# Patient Record
Sex: Female | Born: 1945 | Race: White | Hispanic: No | Smoking: Former smoker
Health system: Southern US, Community
[De-identification: ages and names within clinical notes are randomized; demographics above are authoritative.]

## PROBLEM LIST (undated history)

## (undated) ENCOUNTER — Encounter

## (undated) ENCOUNTER — Encounter
Attending: Student in an Organized Health Care Education/Training Program | Primary: Student in an Organized Health Care Education/Training Program

## (undated) ENCOUNTER — Encounter: Payer: MEDICARE | Attending: Specialist | Primary: Specialist

## (undated) ENCOUNTER — Encounter: Attending: Cardiovascular Disease | Primary: Cardiovascular Disease

## (undated) ENCOUNTER — Encounter: Attending: Vascular Surgery | Primary: Vascular Surgery

## (undated) ENCOUNTER — Ambulatory Visit

## (undated) ENCOUNTER — Telehealth
Attending: Student in an Organized Health Care Education/Training Program | Primary: Student in an Organized Health Care Education/Training Program

## (undated) ENCOUNTER — Telehealth

## (undated) ENCOUNTER — Encounter: Attending: Nurse Practitioner | Primary: Nurse Practitioner

## (undated) ENCOUNTER — Encounter
Payer: MEDICARE | Attending: Student in an Organized Health Care Education/Training Program | Primary: Student in an Organized Health Care Education/Training Program

## (undated) ENCOUNTER — Encounter: Attending: Family | Primary: Family

## (undated) ENCOUNTER — Ambulatory Visit: Payer: MEDICARE

## (undated) ENCOUNTER — Ambulatory Visit: Payer: MEDICARE | Attending: Specialist | Primary: Specialist

## (undated) ENCOUNTER — Encounter: Payer: MEDICARE | Attending: Vascular Surgery | Primary: Vascular Surgery

## (undated) ENCOUNTER — Telehealth: Attending: Vascular Surgery | Primary: Vascular Surgery

## (undated) ENCOUNTER — Encounter: Attending: Orthopaedic Surgery of the Spine | Primary: Orthopaedic Surgery of the Spine

## (undated) ENCOUNTER — Non-Acute Institutional Stay: Payer: MEDICARE | Attending: Family | Primary: Family

## (undated) ENCOUNTER — Encounter: Attending: Specialist | Primary: Specialist

## (undated) ENCOUNTER — Telehealth: Attending: Cardiovascular Disease | Primary: Cardiovascular Disease

## (undated) DIAGNOSIS — R29898 Other symptoms and signs involving the musculoskeletal system: Secondary | ICD-10-CM

## (undated) DIAGNOSIS — K219 Gastro-esophageal reflux disease without esophagitis: Secondary | ICD-10-CM

## (undated) DIAGNOSIS — D649 Anemia, unspecified: Secondary | ICD-10-CM

## (undated) DIAGNOSIS — R7303 Prediabetes: Secondary | ICD-10-CM

## (undated) DIAGNOSIS — C259 Malignant neoplasm of pancreas, unspecified: Secondary | ICD-10-CM

## (undated) DIAGNOSIS — I10 Essential (primary) hypertension: Secondary | ICD-10-CM

## (undated) DIAGNOSIS — I5022 Chronic systolic (congestive) heart failure: Secondary | ICD-10-CM

## (undated) DIAGNOSIS — Z8674 Personal history of sudden cardiac arrest: Secondary | ICD-10-CM

## (undated) DIAGNOSIS — Z950 Presence of cardiac pacemaker: Secondary | ICD-10-CM

## (undated) DIAGNOSIS — Z95810 Presence of automatic (implantable) cardiac defibrillator: Secondary | ICD-10-CM

## (undated) DIAGNOSIS — I509 Heart failure, unspecified: Secondary | ICD-10-CM

## (undated) DIAGNOSIS — I442 Atrioventricular block, complete: Secondary | ICD-10-CM

## (undated) DIAGNOSIS — E7800 Pure hypercholesterolemia, unspecified: Secondary | ICD-10-CM

## (undated) DIAGNOSIS — M5412 Radiculopathy, cervical region: Secondary | ICD-10-CM

## (undated) DIAGNOSIS — F419 Anxiety disorder, unspecified: Secondary | ICD-10-CM

## (undated) HISTORY — PX: HERNIA REPAIR: SHX51

## (undated) HISTORY — PX: NECK SURGERY: SHX720

## (undated) HISTORY — DX: Anemia, unspecified: D64.9

## (undated) HISTORY — PX: ABDOMINAL HYSTERECTOMY: SHX81

## (undated) HISTORY — PX: TONSILLECTOMY: SUR1361

## (undated) MED ORDER — METHYLPREDNISOLONE 4 MG TABLET: 0 days

---

## 2006-02-22 ENCOUNTER — Ambulatory Visit: Payer: Self-pay | Admitting: Family Medicine

## 2006-04-13 ENCOUNTER — Ambulatory Visit: Payer: Self-pay | Admitting: Gastroenterology

## 2007-06-04 ENCOUNTER — Ambulatory Visit: Payer: Self-pay | Admitting: Family Medicine

## 2008-07-29 ENCOUNTER — Ambulatory Visit: Payer: Self-pay | Admitting: Family Medicine

## 2008-10-05 ENCOUNTER — Ambulatory Visit: Payer: Self-pay | Admitting: Internal Medicine

## 2009-08-03 ENCOUNTER — Ambulatory Visit: Payer: Self-pay | Admitting: Family Medicine

## 2009-09-17 ENCOUNTER — Ambulatory Visit: Payer: Self-pay | Admitting: Internal Medicine

## 2009-10-07 ENCOUNTER — Ambulatory Visit: Payer: Self-pay | Admitting: Gastroenterology

## 2010-07-21 ENCOUNTER — Ambulatory Visit: Payer: Self-pay | Admitting: Podiatry

## 2011-01-10 ENCOUNTER — Ambulatory Visit: Payer: Self-pay | Admitting: Family Medicine

## 2011-10-30 ENCOUNTER — Ambulatory Visit: Payer: Self-pay | Admitting: Internal Medicine

## 2012-01-11 ENCOUNTER — Ambulatory Visit: Payer: Self-pay | Admitting: Family Medicine

## 2012-07-11 ENCOUNTER — Ambulatory Visit: Payer: Self-pay | Admitting: Unknown Physician Specialty

## 2013-01-29 ENCOUNTER — Ambulatory Visit: Payer: Self-pay | Admitting: Family Medicine

## 2013-12-25 DIAGNOSIS — M1990 Unspecified osteoarthritis, unspecified site: Secondary | ICD-10-CM | POA: Insufficient documentation

## 2013-12-25 DIAGNOSIS — M2010 Hallux valgus (acquired), unspecified foot: Secondary | ICD-10-CM | POA: Insufficient documentation

## 2013-12-25 DIAGNOSIS — I209 Angina pectoris, unspecified: Secondary | ICD-10-CM | POA: Insufficient documentation

## 2014-03-19 ENCOUNTER — Ambulatory Visit: Payer: Self-pay | Admitting: Family Medicine

## 2014-08-14 ENCOUNTER — Ambulatory Visit: Payer: Self-pay | Admitting: Surgery

## 2014-09-29 ENCOUNTER — Ambulatory Visit: Payer: Self-pay | Admitting: Surgery

## 2014-09-29 DIAGNOSIS — I10 Essential (primary) hypertension: Secondary | ICD-10-CM

## 2014-09-29 LAB — BASIC METABOLIC PANEL
ANION GAP: 5 — AB (ref 7–16)
BUN: 16 mg/dL (ref 7–18)
CALCIUM: 9.2 mg/dL (ref 8.5–10.1)
CHLORIDE: 103 mmol/L (ref 98–107)
Co2: 32 mmol/L (ref 21–32)
Creatinine: 1.1 mg/dL (ref 0.60–1.30)
EGFR (African American): >60
EGFR (Non-African Amer.): 52 — ABNORMAL LOW
Glucose: 84 mg/dL (ref 65–99)
Osmolality: 280 (ref 275–301)
POTASSIUM: 3.6 mmol/L (ref 3.5–5.1)
Sodium: 140 mmol/L (ref 136–145)

## 2014-09-29 LAB — CBC WITH DIFFERENTIAL/PLATELET
BASOS ABS: 0 10*3/uL (ref 0.0–0.1)
Basophil %: 0.7 %
Eosinophil #: 0.2 10*3/uL (ref 0.0–0.7)
Eosinophil %: 3 %
HCT: 38.4 % (ref 35.0–47.0)
HGB: 12.5 g/dL (ref 12.0–16.0)
LYMPHS ABS: 1.2 10*3/uL (ref 1.0–3.6)
Lymphocyte %: 22 %
MCH: 28.6 pg (ref 26.0–34.0)
MCHC: 32.5 g/dL (ref 32.0–36.0)
MCV: 88 fL (ref 80–100)
Monocyte #: 0.4 x10 3/mm (ref 0.2–0.9)
Monocyte %: 6.7 %
NEUTROS PCT: 67.6 %
Neutrophil #: 3.8 10*3/uL (ref 1.4–6.5)
Platelet: 224 10*3/uL (ref 150–440)
RBC: 4.38 10*6/uL (ref 3.80–5.20)
RDW: 14.1 % (ref 11.5–14.5)
WBC: 5.6 10*3/uL (ref 3.6–11.0)

## 2014-10-06 ENCOUNTER — Ambulatory Visit: Payer: Self-pay | Admitting: Surgery

## 2014-11-18 DIAGNOSIS — I10 Essential (primary) hypertension: Secondary | ICD-10-CM | POA: Diagnosis not present

## 2014-11-18 DIAGNOSIS — E669 Obesity, unspecified: Secondary | ICD-10-CM | POA: Diagnosis not present

## 2015-01-07 ENCOUNTER — Ambulatory Visit
Admit: 2015-01-07 | Disposition: A | Discharge status: Home / Self Care | Payer: Self-pay | Attending: Gastroenterology | Admitting: Gastroenterology

## 2015-01-07 DIAGNOSIS — Z800 Family history of malignant neoplasm of digestive organs: Secondary | ICD-10-CM | POA: Diagnosis not present

## 2015-01-07 DIAGNOSIS — Z79899 Other long term (current) drug therapy: Secondary | ICD-10-CM | POA: Diagnosis not present

## 2015-01-07 DIAGNOSIS — Z9889 Other specified postprocedural states: Secondary | ICD-10-CM | POA: Diagnosis not present

## 2015-01-07 DIAGNOSIS — I10 Essential (primary) hypertension: Secondary | ICD-10-CM | POA: Diagnosis not present

## 2015-01-07 DIAGNOSIS — Z86010 Personal history of colonic polyps: Secondary | ICD-10-CM | POA: Diagnosis not present

## 2015-01-07 DIAGNOSIS — Z7982 Long term (current) use of aspirin: Secondary | ICD-10-CM | POA: Diagnosis not present

## 2015-01-07 DIAGNOSIS — Z880 Allergy status to penicillin: Secondary | ICD-10-CM | POA: Diagnosis not present

## 2015-01-07 DIAGNOSIS — Z09 Encounter for follow-up examination after completed treatment for conditions other than malignant neoplasm: Secondary | ICD-10-CM | POA: Diagnosis not present

## 2015-01-07 DIAGNOSIS — Z91041 Radiographic dye allergy status: Secondary | ICD-10-CM | POA: Diagnosis not present

## 2015-01-07 LAB — HM COLONOSCOPY: HM COLON: NORMAL

## 2015-01-29 DIAGNOSIS — I272 Other secondary pulmonary hypertension: Secondary | ICD-10-CM | POA: Diagnosis not present

## 2015-01-29 DIAGNOSIS — I442 Atrioventricular block, complete: Secondary | ICD-10-CM | POA: Diagnosis not present

## 2015-01-29 DIAGNOSIS — Z95810 Presence of automatic (implantable) cardiac defibrillator: Secondary | ICD-10-CM | POA: Diagnosis not present

## 2015-01-29 DIAGNOSIS — I429 Cardiomyopathy, unspecified: Secondary | ICD-10-CM | POA: Diagnosis not present

## 2015-01-29 DIAGNOSIS — I2510 Atherosclerotic heart disease of native coronary artery without angina pectoris: Secondary | ICD-10-CM | POA: Diagnosis not present

## 2015-01-29 DIAGNOSIS — I447 Left bundle-branch block, unspecified: Secondary | ICD-10-CM | POA: Diagnosis not present

## 2015-01-29 DIAGNOSIS — R112 Nausea with vomiting, unspecified: Secondary | ICD-10-CM | POA: Diagnosis not present

## 2015-01-29 DIAGNOSIS — Z880 Allergy status to penicillin: Secondary | ICD-10-CM | POA: Diagnosis not present

## 2015-01-29 DIAGNOSIS — I10 Essential (primary) hypertension: Secondary | ICD-10-CM | POA: Diagnosis not present

## 2015-01-29 DIAGNOSIS — I361 Nonrheumatic tricuspid (valve) insufficiency: Secondary | ICD-10-CM | POA: Diagnosis not present

## 2015-01-29 DIAGNOSIS — Z87891 Personal history of nicotine dependence: Secondary | ICD-10-CM | POA: Diagnosis not present

## 2015-01-29 DIAGNOSIS — R55 Syncope and collapse: Secondary | ICD-10-CM | POA: Diagnosis not present

## 2015-01-29 DIAGNOSIS — I509 Heart failure, unspecified: Secondary | ICD-10-CM | POA: Diagnosis not present

## 2015-01-29 DIAGNOSIS — I5189 Other ill-defined heart diseases: Secondary | ICD-10-CM | POA: Diagnosis not present

## 2015-01-29 DIAGNOSIS — J939 Pneumothorax, unspecified: Secondary | ICD-10-CM | POA: Diagnosis not present

## 2015-01-29 DIAGNOSIS — I517 Cardiomegaly: Secondary | ICD-10-CM | POA: Diagnosis not present

## 2015-01-29 DIAGNOSIS — I5022 Chronic systolic (congestive) heart failure: Secondary | ICD-10-CM | POA: Diagnosis not present

## 2015-01-29 DIAGNOSIS — I340 Nonrheumatic mitral (valve) insufficiency: Secondary | ICD-10-CM | POA: Diagnosis not present

## 2015-01-29 DIAGNOSIS — E785 Hyperlipidemia, unspecified: Secondary | ICD-10-CM | POA: Diagnosis not present

## 2015-01-29 DIAGNOSIS — I4510 Unspecified right bundle-branch block: Secondary | ICD-10-CM | POA: Diagnosis not present

## 2015-01-29 DIAGNOSIS — Z950 Presence of cardiac pacemaker: Secondary | ICD-10-CM | POA: Diagnosis not present

## 2015-01-29 DIAGNOSIS — E119 Type 2 diabetes mellitus without complications: Secondary | ICD-10-CM | POA: Diagnosis not present

## 2015-01-29 DIAGNOSIS — R401 Stupor: Secondary | ICD-10-CM | POA: Diagnosis not present

## 2015-01-31 NOTE — Op Note (Signed)
PATIENT NAME:  Anna Marsh, Anna Marsh MR#:  [id number] DATE OF BIRTH:  [birth year]  DATE OF PROCEDURE:  10/06/2014  PREOPERATIVE DIAGNOSIS: Upper midline ventral hernia, symptomatic.   POSTOPERATIVE DIAGNOSIS: Upper midline ventral hernia, symptomatic.   PROCEDURE PERFORMED: Repair of ventral hernia with subfascial preperitoneal 6 cm circular Ventralex ST hernia patch.   SURGEON: Sherri Rad, M.D.   ASSISTANT: None.   ANESTHESIA: General with local.   FINDINGS: There was a 2 cm fascial defect with incarcerated preperitoneal fat.   SPECIMENS: None.   DESCRIPTION OF PROCEDURE: With informed consent obtained from the patient, she was brought to the operating room, positioned supine and general anesthesia was induced. The patient's abdomen was widely prepped and draped with ChloraPrep, followed by Charlie Pitter, and a timeout was observed.   A short upper midline skin incision was fashioned above the umbilicus with scalpel and carried down to the hernia sac contents with electrocautery and sharp dissection. The preperitoneal fat was identified. It was reduced back into the preperitoneal space. The fascial edges were defined. Finger sweep demonstrated a pocket which could be created within the preperitoneal space accommodating a 6 cm circular Ventralex ST hernia patch, which was inserted and unfurled into the fascia. It was secured at 4 points with mattress-type sutures of #0 Vicryl suture. Wound was irrigated. The fascia was then reapproximated loosely with figure-of-eight #0 Vicryl sutures in transverse orientation. The deep space was obliterated with a 2-0 Vicryl suture. Then 4-0 Vicryl subcuticular was applied to the skin edges followed by 3 interrupted vertical mattress 4-0 nylon sutures for purposes of skin eversion. Benzoin, Steri-Strips, and a Tegaderm dressing was then applied, and the patient was subsequently extubated and taken to the recovery room in stable and satisfactory condition by anesthesia services.    ____________________________ Jeannette How Marina Gravel, MD mab:sb D: 10/06/2014 09:00:30 ET T: 10/06/2014 11:17:30 ET JOB#: [id number]  cc: Elta Guadeloupe A. Marina Gravel, MD, <Dictator> Hortencia Conradi MD ELECTRONICALLY SIGNED 10/14/2014 9:57

## 2015-02-01 DIAGNOSIS — I429 Cardiomyopathy, unspecified: Secondary | ICD-10-CM | POA: Insufficient documentation

## 2015-02-03 DIAGNOSIS — Z950 Presence of cardiac pacemaker: Secondary | ICD-10-CM

## 2015-02-03 HISTORY — DX: Presence of cardiac pacemaker: Z95.0

## 2015-02-03 HISTORY — PX: PACEMAKER IMPLANT: EP1218

## 2015-02-10 DIAGNOSIS — I442 Atrioventricular block, complete: Secondary | ICD-10-CM | POA: Diagnosis not present

## 2015-02-10 DIAGNOSIS — I429 Cardiomyopathy, unspecified: Secondary | ICD-10-CM | POA: Diagnosis not present

## 2015-02-10 DIAGNOSIS — Z95810 Presence of automatic (implantable) cardiac defibrillator: Secondary | ICD-10-CM | POA: Diagnosis not present

## 2015-02-10 DIAGNOSIS — I447 Left bundle-branch block, unspecified: Secondary | ICD-10-CM | POA: Diagnosis not present

## 2015-02-11 DIAGNOSIS — Z95810 Presence of automatic (implantable) cardiac defibrillator: Secondary | ICD-10-CM | POA: Diagnosis not present

## 2015-02-15 DIAGNOSIS — E782 Mixed hyperlipidemia: Secondary | ICD-10-CM | POA: Insufficient documentation

## 2015-02-18 DIAGNOSIS — H2513 Age-related nuclear cataract, bilateral: Secondary | ICD-10-CM | POA: Diagnosis not present

## 2015-02-24 DIAGNOSIS — R55 Syncope and collapse: Secondary | ICD-10-CM | POA: Diagnosis not present

## 2015-02-24 DIAGNOSIS — Z9889 Other specified postprocedural states: Secondary | ICD-10-CM | POA: Insufficient documentation

## 2015-02-24 DIAGNOSIS — I10 Essential (primary) hypertension: Secondary | ICD-10-CM | POA: Diagnosis not present

## 2015-02-24 DIAGNOSIS — I5022 Chronic systolic (congestive) heart failure: Secondary | ICD-10-CM | POA: Insufficient documentation

## 2015-02-28 DIAGNOSIS — I447 Left bundle-branch block, unspecified: Secondary | ICD-10-CM | POA: Insufficient documentation

## 2015-02-28 DIAGNOSIS — Z95810 Presence of automatic (implantable) cardiac defibrillator: Secondary | ICD-10-CM | POA: Insufficient documentation

## 2015-02-28 DIAGNOSIS — I442 Atrioventricular block, complete: Secondary | ICD-10-CM | POA: Insufficient documentation

## 2015-02-28 DIAGNOSIS — K219 Gastro-esophageal reflux disease without esophagitis: Secondary | ICD-10-CM | POA: Insufficient documentation

## 2015-02-28 DIAGNOSIS — I10 Essential (primary) hypertension: Secondary | ICD-10-CM | POA: Insufficient documentation

## 2015-02-28 DIAGNOSIS — E7800 Pure hypercholesterolemia, unspecified: Secondary | ICD-10-CM | POA: Insufficient documentation

## 2015-02-28 DIAGNOSIS — E669 Obesity, unspecified: Secondary | ICD-10-CM | POA: Insufficient documentation

## 2015-03-02 ENCOUNTER — Telehealth: Payer: Self-pay | Admitting: *Deleted

## 2015-03-02 NOTE — Telephone Encounter (Signed)
Called to talk to [facility] about setting up Cardiac Rehab appointment. She was not available to talk this afternoon. Try again per Inez Catalina.

## 2015-03-24 DIAGNOSIS — R0602 Shortness of breath: Secondary | ICD-10-CM | POA: Diagnosis not present

## 2015-03-24 DIAGNOSIS — Z95810 Presence of automatic (implantable) cardiac defibrillator: Secondary | ICD-10-CM | POA: Diagnosis not present

## 2015-03-24 DIAGNOSIS — I5022 Chronic systolic (congestive) heart failure: Secondary | ICD-10-CM | POA: Diagnosis not present

## 2015-03-24 DIAGNOSIS — I10 Essential (primary) hypertension: Secondary | ICD-10-CM | POA: Diagnosis not present

## 2015-04-02 ENCOUNTER — Other Ambulatory Visit: Payer: Self-pay

## 2015-04-02 MED ORDER — FUROSEMIDE 40 MG PO TABS: 40.0000 mg | ORAL_TABLET | Freq: Every day | ORAL | Status: DC

## 2015-04-02 MED ORDER — METOPROLOL SUCCINATE ER 25 MG PO TB24: 25.0000 mg | ORAL_TABLET | Freq: Every day | ORAL | Status: DC

## 2015-04-02 MED ORDER — LISINOPRIL 5 MG PO TABS: 5.0000 mg | ORAL_TABLET | Freq: Every day | ORAL | Status: DC

## 2015-04-06 ENCOUNTER — Other Ambulatory Visit: Payer: Self-pay | Admitting: Emergency Medicine

## 2015-04-06 DIAGNOSIS — I10 Essential (primary) hypertension: Secondary | ICD-10-CM

## 2015-04-06 MED ORDER — METOPROLOL SUCCINATE ER 25 MG PO TB24: 25.0000 mg | ORAL_TABLET | Freq: Every day | ORAL | Status: DC

## 2015-04-06 MED ORDER — FUROSEMIDE 40 MG PO TABS: 40.0000 mg | ORAL_TABLET | Freq: Every day | ORAL | Status: DC

## 2015-04-06 MED ORDER — LISINOPRIL 10 MG PO TABS: 10.0000 mg | ORAL_TABLET | Freq: Every day | ORAL | Status: DC

## 2015-05-06 DIAGNOSIS — R55 Syncope and collapse: Secondary | ICD-10-CM | POA: Diagnosis not present

## 2015-05-06 DIAGNOSIS — I429 Cardiomyopathy, unspecified: Secondary | ICD-10-CM | POA: Diagnosis not present

## 2015-05-06 DIAGNOSIS — Z95810 Presence of automatic (implantable) cardiac defibrillator: Secondary | ICD-10-CM | POA: Diagnosis not present

## 2015-05-14 DIAGNOSIS — I429 Cardiomyopathy, unspecified: Secondary | ICD-10-CM | POA: Diagnosis not present

## 2015-05-14 DIAGNOSIS — Z4502 Encounter for adjustment and management of automatic implantable cardiac defibrillator: Secondary | ICD-10-CM | POA: Diagnosis not present

## 2015-05-18 ENCOUNTER — Encounter: Payer: Self-pay | Admitting: Family Medicine

## 2015-05-18 ENCOUNTER — Ambulatory Visit (INDEPENDENT_AMBULATORY_CARE_PROVIDER_SITE_OTHER): Payer: Medicare Other | Admitting: Family Medicine

## 2015-05-18 VITALS — BP 104/66 | HR 84 | Temp 98.1°F | Resp 12 | Wt 255.0 lb

## 2015-05-18 DIAGNOSIS — E669 Obesity, unspecified: Secondary | ICD-10-CM

## 2015-05-18 DIAGNOSIS — I10 Essential (primary) hypertension: Secondary | ICD-10-CM | POA: Diagnosis not present

## 2015-05-18 DIAGNOSIS — K219 Gastro-esophageal reflux disease without esophagitis: Secondary | ICD-10-CM

## 2015-05-18 DIAGNOSIS — F419 Anxiety disorder, unspecified: Secondary | ICD-10-CM

## 2015-05-18 DIAGNOSIS — E7800 Pure hypercholesterolemia, unspecified: Secondary | ICD-10-CM

## 2015-05-18 DIAGNOSIS — Z95810 Presence of automatic (implantable) cardiac defibrillator: Secondary | ICD-10-CM | POA: Diagnosis not present

## 2015-05-18 MED ORDER — SERTRALINE HCL 50 MG PO TABS: 50.0000 mg | ORAL_TABLET | Freq: Every day | ORAL | Status: DC

## 2015-05-18 NOTE — Progress Notes (Signed)
Patient ID: Anna Marsh, female   DOB: 04/04/1946, 69 y.o.   MRN: [id number]    Subjective:  HPI   Hypertension, follow-up:  BP Readings from Last 3 Encounters:  05/18/15 104/66  02/10/15 112/62    She was last seen for hypertension 5 months ago.  BP at that visit was 112/62. Management changes since that visit include none. She reports good compliance with treatment. She is not having side effects.  She is not exercising. She is adherent to low salt diet.   Outside blood pressures are not been checked. She is experiencing none.     Weight trend: stable Wt Readings from Last 3 Encounters:  05/18/15 255 lb (115.667 kg)  02/10/15 262 lb (118.842 kg)      Lipid/Cholesterol, Follow-up:   Last seen for this5 months ago.  Management changes since that visit include none. . Last Lipid Panel: No results found for: CHOL, TRIG, HDL, CHOLHDL, VLDL, LDLCALC, LDLDIRECT  She reports good compliance with treatment. She is not having side effects.  Current symptoms include none and have been stable. Weight trend: stable Prior visit with dietician: no Current diet: low salt Current exercise: none  Wt Readings from Last 3 Encounters:  05/18/15 255 lb (115.667 kg)  02/10/15 262 lb (118.842 kg)     Anxiety: Patient complains of anxiety, insomnia due to worrying about her husband and his memory issues.  She has the following symptoms: insomnia. Onset of symptoms was approximately a few months ago, gradually worsening since that time. She denies current suicidal and homicidal ideation. Family history significant for OCD for sister. She has never been treated.      Prior to Admission medications   Medication Sig Start Date End Date Taking? Authorizing Provider  nitroGLYCERIN (NITROSTAT) 0.4 MG SL tablet Place under the tongue. 02/03/15 02/03/16 Yes Historical Provider, MD  aspirin 81 MG tablet Take by mouth.    Historical Provider, MD  atorvastatin (LIPITOR) 40 MG tablet  Take by mouth.    Historical Provider, MD  cyanocobalamin 1000 MCG tablet Take by mouth.    Historical Provider, MD  furosemide (LASIX) 40 MG tablet Take 1 tablet (40 mg total) by mouth daily. 04/06/15   Richard Maceo Pro., MD  hydrochlorothiazide (HYDRODIURIL) 25 MG tablet Take by mouth.    Historical Provider, MD  lisinopril (PRINIVIL,ZESTRIL) 10 MG tablet Take 1 tablet (10 mg total) by mouth daily. 04/06/15   Richard Maceo Pro., MD  loratadine (CLARITIN) 10 MG tablet Take by mouth.    Historical Provider, MD  metoprolol succinate (TOPROL-XL) 25 MG 24 hr tablet Take 1 tablet (25 mg total) by mouth daily. 04/06/15   Richard Maceo Pro., MD  MULTIPLE VITAMIN PO Take by mouth.    Historical Provider, MD  omeprazole (PRILOSEC) 20 MG capsule Take by mouth.    Historical Provider, MD    Patient Active Problem List   Diagnosis Date Noted  . Cardiomyopathy 02/28/2015  . Acquired complete AV block 02/28/2015  . GERD (gastroesophageal reflux disease) 02/28/2015  . Hypercholesterolemia 02/28/2015  . Hypertension 02/28/2015  . Cardiac defibrillator in place 02/28/2015  . Block, bundle branch, left 02/28/2015  . Adiposity 02/28/2015  . Acid reflux 02/28/2015  . Chronic systolic heart failure 33/29/5188  . H/O cardiac catheterization 02/24/2015  . 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 Diabetes mellitus, type 2 01/29/2015  . Angina pectoris 12/25/2013  . Arthritis 12/25/2013  . Hallux abducto valgus 12/25/2013  No past medical history on file.  Social History   Social History  . Marital Status: Married    Spouse Name: N/A  . Number of Children: N/A  . Years of Education: N/A   Occupational History  . Not on file.   Social History Main Topics  . Smoking status: Never Smoker   . Smokeless tobacco: Never Used  . Alcohol Use: No  . Drug Use: No  . Sexual Activity: No   Other Topics Concern  . Not on file   Social History Narrative    Allergies    Allergen Reactions  . Iodine     Contrast Dye  . Penicillins     Review of Systems  Constitutional: Positive for malaise/fatigue (fair). Negative for fever and chills.  Respiratory: Negative for cough, hemoptysis, shortness of breath and wheezing.   Cardiovascular: Negative for chest pain, palpitations and leg swelling.  Gastrointestinal: Negative for heartburn, nausea, vomiting and abdominal pain.  Musculoskeletal: Positive for back pain (sometimes). Negative for myalgias, joint pain and neck pain.  Neurological: Negative for dizziness, tingling, tremors, weakness and headaches.  Psychiatric/Behavioral: Negative for depression, suicidal ideas, hallucinations and memory loss. The patient is nervous/anxious and has insomnia.      There is no immunization history on file for this patient. Objective:  BP 104/66 mmHg  Pulse 84  Temp(Src) 98.1 F (36.7 C)  Resp 12  Wt 255 lb (115.667 kg)  Physical Exam  Constitutional: She is oriented to person, place, and time and well-developed, well-nourished, and in no distress.  HENT:  Head: Normocephalic.  Eyes: Conjunctivae are normal. Pupils are equal, round, and reactive to light.  Neck: Normal range of motion. Neck supple.  Cardiovascular: Normal rate, regular rhythm, normal heart sounds and intact distal pulses.  Exam reveals no friction rub.   No murmur heard. Pulmonary/Chest: Effort normal. No respiratory distress. She has no wheezes. She has no rales.  Musculoskeletal: Normal range of motion. She exhibits no edema or tenderness.  Neurological: She is alert and oriented to person, place, and time. Gait normal.  Psychiatric: Memory and judgment normal.    Lab Results  Component Value Date   WBC 5.6 09/29/2014   HGB 12.5 09/29/2014   HCT 38.4 09/29/2014   PLT 224 09/29/2014   GLUCOSE 84 09/29/2014    CMP     Component Value Date/Time   NA 140 09/29/2014 1128   K 3.6 09/29/2014 1128   CL 103 09/29/2014 1128   CO2 32  09/29/2014 1128   GLUCOSE 84 09/29/2014 1128   BUN 16 09/29/2014 1128   CREATININE 1.10 09/29/2014 1128   CALCIUM 9.2 09/29/2014 1128   GFRNONAA 52* 09/29/2014 1128   GFRAA >60 09/29/2014 1128    Assessment and Plan :  1. Essential hypertension Stable. - CBC with Differential/Platelet - COMPLETE METABOLIC PANEL WITH GFR  2. Gastroesophageal reflux disease, esophagitis presence not specified Stable. - TSH  3. Hypercholesterolemia Check levels. - COMPLETE METABOLIC PANEL WITH GFR - Lipid Panel With LDL/HDL Ratio  4. Cardiac defibrillator in place Following cardiologist.  5. Adiposity Stable. - TSH  6. Acute anxiety New. Will start medication and follow up in 1 to 2 months. - sertraline (ZOLOFT) 50 MG tablet; Take 1 tablet (50 mg total) by mouth daily.  Dispense: 30 tablet; Refill: 3 Her husband has dementia in this is becoming more of a stressful issue for this patient.    Patient was seen and examined by Dr. Eulas Post and  note was scribed by Theressa Millard, RMA.'    [facility] 05/18/2015 11:38 AM

## 2015-05-19 ENCOUNTER — Telehealth: Payer: Self-pay | Admitting: Family Medicine

## 2015-05-19 NOTE — Telephone Encounter (Signed)
Pt went to [facility] to pick up her new RX for anxiety and the pharmacy told her they have not recd anything.  Can we check on this and call her [phone].  She uses the one on [address].  Thanks Con Memos

## 2015-05-19 NOTE — Telephone Encounter (Signed)
Called patient to verify med, and Patient reports that this is a new prescription you wanted her to try.

## 2015-05-20 NOTE — Telephone Encounter (Signed)
This was done during OV. It was confirmed by the pharmacy that they received it. Patient advised.

## 2015-05-26 DIAGNOSIS — E782 Mixed hyperlipidemia: Secondary | ICD-10-CM | POA: Diagnosis not present

## 2015-05-26 DIAGNOSIS — R05 Cough: Secondary | ICD-10-CM | POA: Diagnosis not present

## 2015-05-26 DIAGNOSIS — I5022 Chronic systolic (congestive) heart failure: Secondary | ICD-10-CM | POA: Diagnosis not present

## 2015-05-26 DIAGNOSIS — I10 Essential (primary) hypertension: Secondary | ICD-10-CM | POA: Diagnosis not present

## 2015-06-22 ENCOUNTER — Other Ambulatory Visit: Payer: Self-pay | Admitting: Family Medicine

## 2015-06-22 DIAGNOSIS — F419 Anxiety disorder, unspecified: Secondary | ICD-10-CM

## 2015-06-22 MED ORDER — SERTRALINE HCL 50 MG PO TABS: 50.0000 mg | ORAL_TABLET | Freq: Every day | ORAL | Status: DC

## 2015-06-22 NOTE — Telephone Encounter (Signed)
Pt would like [place] to send refills for sertraline (ZOLOFT) 50 MG tablet to [facility] order for 90 days supply instead of 30 day supply. Pt understands she has refills on the last RX but she would really like to get it in 90 day supply. Thanks TNP

## 2015-06-22 NOTE — Telephone Encounter (Signed)
Done-aa 

## 2015-06-24 ENCOUNTER — Ambulatory Visit (INDEPENDENT_AMBULATORY_CARE_PROVIDER_SITE_OTHER): Payer: Medicare Other

## 2015-06-24 DIAGNOSIS — Z23 Encounter for immunization: Secondary | ICD-10-CM | POA: Diagnosis not present

## 2015-06-29 ENCOUNTER — Other Ambulatory Visit: Payer: Self-pay

## 2015-06-29 DIAGNOSIS — I5022 Chronic systolic (congestive) heart failure: Secondary | ICD-10-CM | POA: Diagnosis not present

## 2015-06-29 DIAGNOSIS — I10 Essential (primary) hypertension: Secondary | ICD-10-CM | POA: Diagnosis not present

## 2015-06-29 DIAGNOSIS — E782 Mixed hyperlipidemia: Secondary | ICD-10-CM | POA: Diagnosis not present

## 2015-06-29 DIAGNOSIS — I209 Angina pectoris, unspecified: Secondary | ICD-10-CM | POA: Diagnosis not present

## 2015-06-29 DIAGNOSIS — R55 Syncope and collapse: Secondary | ICD-10-CM | POA: Diagnosis not present

## 2015-06-29 MED ORDER — HYDROCHLOROTHIAZIDE 25 MG PO TABS: 25.0000 mg | ORAL_TABLET | Freq: Every day | ORAL | Status: DC

## 2015-06-29 MED ORDER — ATORVASTATIN CALCIUM 40 MG PO TABS: 40.0000 mg | ORAL_TABLET | Freq: Every day | ORAL | Status: DC

## 2015-06-29 MED ORDER — OMEPRAZOLE 20 MG PO CPDR: 20.0000 mg | DELAYED_RELEASE_CAPSULE | Freq: Every day | ORAL | Status: DC

## 2015-07-20 ENCOUNTER — Encounter: Payer: Self-pay | Admitting: Family Medicine

## 2015-07-20 ENCOUNTER — Ambulatory Visit (INDEPENDENT_AMBULATORY_CARE_PROVIDER_SITE_OTHER): Payer: Medicare Other | Admitting: Family Medicine

## 2015-07-20 VITALS — BP 134/60 | HR 70 | Temp 98.2°F | Resp 16 | Wt 253.0 lb

## 2015-07-20 DIAGNOSIS — E7800 Pure hypercholesterolemia, unspecified: Secondary | ICD-10-CM

## 2015-07-20 DIAGNOSIS — I10 Essential (primary) hypertension: Secondary | ICD-10-CM

## 2015-07-20 DIAGNOSIS — R7303 Prediabetes: Secondary | ICD-10-CM | POA: Diagnosis not present

## 2015-07-20 DIAGNOSIS — E876 Hypokalemia: Secondary | ICD-10-CM

## 2015-07-20 DIAGNOSIS — F419 Anxiety disorder, unspecified: Secondary | ICD-10-CM | POA: Diagnosis not present

## 2015-07-20 NOTE — Progress Notes (Signed)
Patient ID: Anna Marsh, female   DOB: 1946/04/09, 69 y.o.   MRN: [id number]    Subjective:  HPI Pt is here today for a 2 month follow up for anxiety. She was started on Sertraline about 2 months ago. She reports that she is doing well on it and does not seem to have any side effects. She has lost her sister since the LOV and she said she thinks the medication has helped her because she is coping ok with her death.   Prior to Admission medications   Medication Sig Start Date End Date Taking? Authorizing Provider  aspirin 81 MG tablet Take by mouth.   Yes Historical Provider, MD  atorvastatin (LIPITOR) 40 MG tablet Take 1 tablet (40 mg total) by mouth daily. 06/29/15  Yes Margery Szostak Maceo Pro., MD  cyanocobalamin 1000 MCG tablet Take by mouth.   Yes Historical Provider, MD  furosemide (LASIX) 40 MG tablet Take 1 tablet (40 mg total) by mouth daily. 04/06/15  Yes Katelinn Justice Maceo Pro., MD  hydrochlorothiazide (HYDRODIURIL) 25 MG tablet Take 1 tablet (25 mg total) by mouth daily. 06/29/15  Yes Sam Wunschel Maceo Pro., MD  loratadine (CLARITIN) 10 MG tablet Take by mouth.   Yes Historical Provider, MD  metoprolol succinate (TOPROL-XL) 25 MG 24 hr tablet Take 1 tablet (25 mg total) by mouth daily. 04/06/15  Yes Tangy Drozdowski Maceo Pro., MD  MULTIPLE VITAMIN PO Take by mouth.   Yes Historical Provider, MD  nitroGLYCERIN (NITROSTAT) 0.4 MG SL tablet Place under the tongue. 02/03/15 02/03/16 Yes Historical Provider, MD  omeprazole (PRILOSEC) 20 MG capsule Take 1 capsule (20 mg total) by mouth daily. 06/29/15  Yes Ilija Maxim Maceo Pro., MD  sertraline (ZOLOFT) 50 MG tablet Take 1 tablet (50 mg total) by mouth daily. 06/22/15  Yes Jenaveve Fenstermaker Maceo Pro., MD    Patient Active Problem List   Diagnosis Date Noted  . Cardiomyopathy ([place]) 02/28/2015  . 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[place]) 02/28/2015  . GERD (gastroesophageal reflux disease) 02/28/2015  . Hypercholesterolemia 02/28/2015  . Hypertension 02/28/2015  .  Cardiac defibrillator in place 02/28/2015  . Block, bundle branch, left 02/28/2015  . Adiposity 02/28/2015  . Acid reflux 02/28/2015  . Complete atrioventricular block ([place]) 02/28/2015  . Chronic systolic heart failure ([place]) 02/24/2015  . H/O cardiac catheterization 02/24/2015  . 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 Diabetes mellitus, type 2 ([place]) 01/29/2015  . Angina pectoris ([place]) 12/25/2013  . Arthritis 12/25/2013  . 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 No pertinent past medical history.  Social History   Social History  . Marital Status: Married    Spouse Name: N/A  . Number of Children: N/A  . Years of Education: N/A   Occupational History  . Not on file.   Social History Main Topics  . Smoking status: Never Smoker   . Smokeless tobacco: Never Used  . Alcohol Use: No  . Drug Use: No  . Sexual Activity: No   Other Topics Concern  . Not on file   Social History Narrative    Allergies  Allergen Reactions  . Iodine     Contrast Dye  . Penicillins     Review of Systems  Constitutional: Negative.   HENT: Negative.   Eyes: Negative.   Respiratory: Negative.   Cardiovascular: Negative.   Gastrointestinal: Negative.   Genitourinary: Negative.   Musculoskeletal: Negative.   Skin: Negative.   Neurological: Negative.  Endo/Heme/Allergies: Negative.   Psychiatric/Behavioral: Negative.     Immunization History  Administered Date(s) Administered  . Influenza, High Dose Seasonal PF 06/24/2015   Objective:  BP 134/60 mmHg  Pulse 70  Temp(Src) 98.2 F (36.8 C) (Oral)  Resp 16  Wt 253 lb (114.76 kg)  Physical Exam  Constitutional: She is oriented to person, place, and time and well-developed, well-nourished, and in no distress.  HENT:  Head: Normocephalic and atraumatic.  Right Ear: External ear normal.  Left Ear: External ear normal.  Nose: Nose normal.  Eyes: Conjunctivae are normal.  Neck: Neck  supple.  Cardiovascular: Normal rate, regular rhythm and normal heart sounds.   Pulmonary/Chest: Effort normal and breath sounds normal.  Abdominal: Soft.  Neurological: She is alert and oriented to person, place, and time.  Skin: Skin is warm and dry.  Psychiatric: Mood, memory, affect and judgment normal.    Lab Results  Component Value Date   WBC 5.6 09/29/2014   HGB 12.5 09/29/2014   HCT 38.4 09/29/2014   PLT 224 09/29/2014   GLUCOSE 84 09/29/2014    CMP     Component Value Date/Time   NA 140 09/29/2014 1128   K 3.6 09/29/2014 1128   CL 103 09/29/2014 1128   CO2 32 09/29/2014 1128   GLUCOSE 84 09/29/2014 1128   BUN 16 09/29/2014 1128   CREATININE 1.10 09/29/2014 1128   CALCIUM 9.2 09/29/2014 1128   GFRNONAA 52* 09/29/2014 1128   GFRAA >60 09/29/2014 1128    Assessment and Plan :  1. Acute anxiety   2. Essential hypertension  - CBC with Differential/Platelet - TSH - Comprehensive metabolic panel  3. Hypercholesterolemia  - Lipid Panel With LDL/HDL Ratio - Comprehensive metabolic panel  4. Pre-diabetes  - Comprehensive metabolic panel - Hemoglobin A1c  5. Low serum potassium level Follow and stop supplement when corrected on [place]. - Potassium 6.CHF Pt now on [place] I have done the exam and reviewed the above chart and it is accurate to the best of my knowledge.  Miguel Aschoff MD [facility] 07/20/2015 2:44 PM

## 2015-07-21 DIAGNOSIS — R7303 Prediabetes: Secondary | ICD-10-CM | POA: Diagnosis not present

## 2015-07-21 DIAGNOSIS — E7800 Pure hypercholesterolemia, unspecified: Secondary | ICD-10-CM | POA: Diagnosis not present

## 2015-07-21 DIAGNOSIS — I10 Essential (primary) hypertension: Secondary | ICD-10-CM | POA: Diagnosis not present

## 2015-07-22 LAB — LIPID PANEL WITH LDL/HDL RATIO
CHOLESTEROL TOTAL: 217 mg/dL — AB (ref 100–199)
HDL: 55 mg/dL (ref >39)
LDL CALC: 136 mg/dL — AB (ref 0–99)
LDL/HDL RATIO: 2.5 ratio (ref 0.0–3.2)
TRIGLYCERIDES: 128 mg/dL (ref 0–149)
VLDL CHOLESTEROL CAL: 26 mg/dL (ref 5–40)

## 2015-07-22 LAB — CBC WITH DIFFERENTIAL/PLATELET
BASOS ABS: 0 10*3/uL (ref 0.0–0.2)
Basos: 1 %
EOS (ABSOLUTE): 0.2 10*3/uL (ref 0.0–0.4)
EOS: 2 %
HEMATOCRIT: 36.2 % (ref 34.0–46.6)
HEMOGLOBIN: 12.2 g/dL (ref 11.1–15.9)
IMMATURE GRANS (ABS): 0 10*3/uL (ref 0.0–0.1)
Immature Granulocytes: 0 %
LYMPHS ABS: 1.7 10*3/uL (ref 0.7–3.1)
LYMPHS: 25 %
MCH: 28.2 pg (ref 26.6–33.0)
MCHC: 33.7 g/dL (ref 31.5–35.7)
MCV: 84 fL (ref 79–97)
MONOCYTES: 6 %
Monocytes Absolute: 0.4 10*3/uL (ref 0.1–0.9)
Neutrophils Absolute: 4.4 10*3/uL (ref 1.4–7.0)
Neutrophils: 66 %
Platelets: 292 10*3/uL (ref 150–379)
RBC: 4.32 x10E6/uL (ref 3.77–5.28)
RDW: 13.8 % (ref 12.3–15.4)
WBC: 6.6 10*3/uL (ref 3.4–10.8)

## 2015-07-22 LAB — COMPREHENSIVE METABOLIC PANEL
ALT: 16 IU/L (ref 0–32)
AST: 19 IU/L (ref 0–40)
Albumin/Globulin Ratio: 1.6 (ref 1.1–2.5)
Albumin: 4.1 g/dL (ref 3.6–4.8)
Alkaline Phosphatase: 104 IU/L (ref 39–117)
BUN/Creatinine Ratio: 15 (ref 11–26)
BUN: 14 mg/dL (ref 8–27)
Bilirubin Total: 0.4 mg/dL (ref 0.0–1.2)
CALCIUM: 9.6 mg/dL (ref 8.7–10.3)
CO2: 31 mmol/L — AB (ref 18–29)
CREATININE: 0.91 mg/dL (ref 0.57–1.00)
Chloride: 93 mmol/L — ABNORMAL LOW (ref 97–106)
GFR calc Af Amer: 74 mL/min/{1.73_m2} (ref >59)
GFR, EST NON AFRICAN AMERICAN: 65 mL/min/{1.73_m2} (ref >59)
GLOBULIN, TOTAL: 2.6 g/dL (ref 1.5–4.5)
GLUCOSE: 110 mg/dL — AB (ref 65–99)
Potassium: 3.1 mmol/L — ABNORMAL LOW (ref 3.5–5.2)
Sodium: 141 mmol/L (ref 136–144)
Total Protein: 6.7 g/dL (ref 6.0–8.5)

## 2015-07-22 LAB — HEMOGLOBIN A1C
ESTIMATED AVERAGE GLUCOSE: 123 mg/dL
Hgb A1c MFr Bld: 5.9 % — ABNORMAL HIGH (ref 4.8–5.6)

## 2015-07-22 LAB — TSH: TSH: 2.02 u[IU]/mL (ref 0.450–4.500)

## 2015-07-22 MED ORDER — ATORVASTATIN CALCIUM 80 MG PO TABS: ORAL_TABLET | ORAL | Status: DC

## 2015-07-22 MED ORDER — POTASSIUM CHLORIDE ER 10 MEQ PO TBCR: 10.0000 meq | EXTENDED_RELEASE_TABLET | Freq: Two times a day (BID) | ORAL | Status: DC

## 2015-08-13 DIAGNOSIS — I429 Cardiomyopathy, unspecified: Secondary | ICD-10-CM | POA: Diagnosis not present

## 2015-08-13 DIAGNOSIS — Z4502 Encounter for adjustment and management of automatic implantable cardiac defibrillator: Secondary | ICD-10-CM | POA: Diagnosis not present

## 2015-08-18 DIAGNOSIS — E876 Hypokalemia: Secondary | ICD-10-CM | POA: Diagnosis not present

## 2015-08-19 LAB — POTASSIUM: POTASSIUM: 3.4 mmol/L — AB (ref 3.5–5.2)

## 2015-09-09 ENCOUNTER — Other Ambulatory Visit: Payer: Self-pay | Admitting: Family Medicine

## 2015-10-26 ENCOUNTER — Ambulatory Visit (INDEPENDENT_AMBULATORY_CARE_PROVIDER_SITE_OTHER): Payer: Medicare Other | Admitting: Family Medicine

## 2015-10-26 VITALS — BP 110/64 | HR 80 | Temp 98.4°F | Resp 16 | Wt 250.0 lb

## 2015-10-26 DIAGNOSIS — J069 Acute upper respiratory infection, unspecified: Secondary | ICD-10-CM | POA: Diagnosis not present

## 2015-10-26 NOTE — Progress Notes (Signed)
Patient ID: Anna Marsh, female   DOB: 1946-03-22, 70 y.o.   MRN: [id number]    Anna Marsh  MRN: [id number] DOB: 1945/10/29  Subjective:  HPI  1. Upper respiratory infection The patient is a 70 year old female who presents for evaluation of her cold symptoms.  She states that she started having a scratchy throat yesterday with watery eyes and a little bit of a cough.  When she coughs she states that what she gets up is clear.  She has had no fever and denies any other symptoms.  Patient Active Problem List   Diagnosis Date Noted  . Pre-diabetes 07/20/2015  . Acute anxiety 07/20/2015  . Cardiomyopathy ([place]) 02/28/2015  . 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[place]) 02/28/2015  . GERD (gastroesophageal reflux disease) 02/28/2015  . Hypercholesterolemia 02/28/2015  . Hypertension 02/28/2015  . Cardiac defibrillator in place 02/28/2015  . Block, bundle branch, left 02/28/2015  . Adiposity 02/28/2015  . Acid reflux 02/28/2015  . Complete atrioventricular block ([place]) 02/28/2015  . Chronic systolic heart failure ([place]) 02/24/2015  . H/O cardiac catheterization 02/24/2015  . 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 Diabetes mellitus, type 2 ([place]) 01/29/2015  . Angina pectoris ([place]) 12/25/2013  . Arthritis 12/25/2013  . 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  Social History   Social History  . Marital Status: Married    Spouse Name: N/A  . Number of Children: N/A  . Years of Education: N/A   Occupational History  . Not on file.   Social History Main Topics  . Smoking status: Never Smoker   . Smokeless tobacco: Never Used  . Alcohol Use: No  . Drug Use: No  . Sexual Activity: No   Other Topics Concern  . Not on file   Social History Narrative    Outpatient Prescriptions Prior to Visit  Medication Sig Dispense Refill  . aspirin 81 MG tablet Take by mouth.    Marland Kitchen atorvastatin (LIPITOR) 80 MG tablet Take 1 tablet  daily 90 tablet 3  . cyanocobalamin 1000 MCG tablet Take by mouth.    . furosemide (LASIX) 40 MG tablet Take 1 tablet (40 mg total) by mouth daily. 90 tablet 1  . hydrochlorothiazide (HYDRODIURIL) 25 MG tablet Take 1 tablet (25 mg total) by mouth daily. 90 tablet 3  . loratadine (CLARITIN) 10 MG tablet Take by mouth.    . metoprolol succinate (TOPROL-XL) 25 MG 24 hr tablet Take 1 tablet (25 mg total) by mouth daily. 90 tablet 1  . MULTIPLE VITAMIN PO Take by mouth.    . nitroGLYCERIN (NITROSTAT) 0.4 MG SL tablet Place under the tongue.    Marland Kitchen omeprazole (PRILOSEC) 20 MG capsule Take 1 capsule (20 mg total) by mouth daily. 90 capsule 3  . potassium chloride (K-DUR) 10 MEQ tablet Take 1 tablet by mouth two  times daily 180 tablet 3  . sertraline (ZOLOFT) 50 MG tablet Take 1 tablet (50 mg total) by mouth daily. 90 tablet 3   No facility-administered medications prior to visit.    Allergies  Allergen Reactions  . Iodinated Diagnostic Agents Other (See Comments)  . Iodine     Contrast Dye  . Penicillins     Review of Systems  Constitutional: Negative for fever, chills, weight loss, malaise/fatigue and diaphoresis.  HENT: Positive for congestion and sore throat. Negative for ear discharge, ear pain, hearing loss, nosebleeds and tinnitus.   Eyes: Positive for discharge.  Negative for blurred vision, double vision, photophobia, pain and redness.  Respiratory: Positive for cough and sputum production. Negative for hemoptysis, shortness of breath and wheezing.   Cardiovascular: Negative for chest pain, palpitations, orthopnea and leg swelling.  Gastrointestinal: Negative.   Neurological: Positive for headaches. Negative for weakness.  Endo/Heme/Allergies: Negative.   Psychiatric/Behavioral: Negative.    Objective:  BP 110/64 mmHg  Pulse 80  Temp(Src) 98.4 F (36.9 C) (Oral)  Resp 16  Wt 250 lb (113.399 kg)  Physical Exam  Constitutional: She is oriented to person, place, and time and  well-developed, well-nourished, and in no distress.  HENT:  Head: Normocephalic and atraumatic.  Right Ear: External ear normal.  Left Ear: External ear normal.  Eyes: Conjunctivae and EOM are normal. Pupils are equal, round, and reactive to light.  Neck: Normal range of motion. Neck supple.  Cardiovascular: Normal rate, regular rhythm and normal heart sounds.   Pulmonary/Chest: Effort normal and breath sounds normal.  Abdominal: Soft.  Neurological: She is alert and oriented to person, place, and time. Gait normal.  Skin: Skin is warm and dry.  Psychiatric: Mood, memory, affect and judgment normal.    Assessment and Plan :   1. Upper respiratory infection Viral, no need for antibiotics.  Patient recommended to increase fluids, and use Vitamin C and Zinc.  May take a week or more to get over.  Robitussin for cough.  Patient to return to clinic if symptoms worsen or change. 2. Cardiomyopathy All issues treated [facility] 10/26/2015 11:00 AM

## 2015-11-12 DIAGNOSIS — I429 Cardiomyopathy, unspecified: Secondary | ICD-10-CM | POA: Diagnosis not present

## 2015-11-12 DIAGNOSIS — Z4502 Encounter for adjustment and management of automatic implantable cardiac defibrillator: Secondary | ICD-10-CM | POA: Diagnosis not present

## 2015-11-16 DIAGNOSIS — I361 Nonrheumatic tricuspid (valve) insufficiency: Secondary | ICD-10-CM | POA: Diagnosis not present

## 2015-11-16 DIAGNOSIS — E785 Hyperlipidemia, unspecified: Secondary | ICD-10-CM | POA: Diagnosis not present

## 2015-11-16 DIAGNOSIS — I499 Cardiac arrhythmia, unspecified: Secondary | ICD-10-CM | POA: Diagnosis not present

## 2015-11-16 DIAGNOSIS — E119 Type 2 diabetes mellitus without complications: Secondary | ICD-10-CM | POA: Diagnosis not present

## 2015-11-16 DIAGNOSIS — I110 Hypertensive heart disease with heart failure: Secondary | ICD-10-CM | POA: Diagnosis not present

## 2015-11-16 DIAGNOSIS — I429 Cardiomyopathy, unspecified: Secondary | ICD-10-CM | POA: Diagnosis not present

## 2015-11-16 DIAGNOSIS — Z95810 Presence of automatic (implantable) cardiac defibrillator: Secondary | ICD-10-CM | POA: Diagnosis not present

## 2015-11-16 DIAGNOSIS — Z79899 Other long term (current) drug therapy: Secondary | ICD-10-CM | POA: Diagnosis not present

## 2015-11-16 DIAGNOSIS — I447 Left bundle-branch block, unspecified: Secondary | ICD-10-CM | POA: Diagnosis not present

## 2015-11-22 DIAGNOSIS — H2513 Age-related nuclear cataract, bilateral: Secondary | ICD-10-CM | POA: Diagnosis not present

## 2015-12-14 DIAGNOSIS — I5032 Chronic diastolic (congestive) heart failure: Secondary | ICD-10-CM | POA: Diagnosis not present

## 2015-12-14 DIAGNOSIS — I10 Essential (primary) hypertension: Secondary | ICD-10-CM | POA: Diagnosis not present

## 2015-12-14 DIAGNOSIS — I5022 Chronic systolic (congestive) heart failure: Secondary | ICD-10-CM | POA: Diagnosis not present

## 2015-12-14 DIAGNOSIS — Z95810 Presence of automatic (implantable) cardiac defibrillator: Secondary | ICD-10-CM | POA: Diagnosis not present

## 2015-12-24 ENCOUNTER — Other Ambulatory Visit: Payer: Self-pay | Admitting: Family Medicine

## 2015-12-24 DIAGNOSIS — Z1231 Encounter for screening mammogram for malignant neoplasm of breast: Secondary | ICD-10-CM

## 2016-01-04 ENCOUNTER — Ambulatory Visit
Admission: RE | Admit: 2016-01-04 | Discharge: 2016-01-04 | Disposition: A | Discharge status: Home / Self Care | Payer: Medicare Other | Source: Ambulatory Visit | Attending: Family Medicine | Admitting: Family Medicine

## 2016-01-04 DIAGNOSIS — Z1231 Encounter for screening mammogram for malignant neoplasm of breast: Secondary | ICD-10-CM | POA: Insufficient documentation

## 2016-01-18 ENCOUNTER — Encounter: Payer: Self-pay | Admitting: Family Medicine

## 2016-01-18 ENCOUNTER — Ambulatory Visit (INDEPENDENT_AMBULATORY_CARE_PROVIDER_SITE_OTHER): Payer: Medicare Other | Admitting: Family Medicine

## 2016-01-18 VITALS — BP 118/60 | HR 78 | Temp 98.3°F | Resp 16 | Wt 249.0 lb

## 2016-01-18 DIAGNOSIS — I10 Essential (primary) hypertension: Secondary | ICD-10-CM | POA: Diagnosis not present

## 2016-01-18 DIAGNOSIS — R739 Hyperglycemia, unspecified: Secondary | ICD-10-CM | POA: Diagnosis not present

## 2016-01-18 DIAGNOSIS — E7800 Pure hypercholesterolemia, unspecified: Secondary | ICD-10-CM

## 2016-01-18 DIAGNOSIS — I5022 Chronic systolic (congestive) heart failure: Secondary | ICD-10-CM

## 2016-01-18 NOTE — Progress Notes (Signed)
Patient ID: Anna Marsh, female   DOB: 1946/05/12, 70 y.o.   MRN: [id number]    Subjective:  HPI  Lipid/Cholesterol, Follow-up:   Last seen for this6 months ago.  Management changes since that visit include increase Lipitor 80 mg daily. . Last Lipid Panel:    Component Value Date/Time   CHOL 217* 07/21/2015 1026   TRIG 128 07/21/2015 1026   HDL 55 07/21/2015 1026   LDLCALC 136* 07/21/2015 1026    Risk factors for vascular disease include diabetes mellitus, hypercholesterolemia and hypertension  She reports good compliance with treatment. She is not having side effects.  Current symptoms include none and have been unchanged. Weight trend: stable  Wt Readings from Last 3 Encounters:  01/18/16 249 lb (112.946 kg)  10/26/15 250 lb (113.399 kg)  07/20/15 253 lb (114.76 kg)   ------------------------------------------------------------------- Will need labs done since increase of cholesterol meds, will need A1c as well with the labs.   Pt reports that she is doing well emotionally.   Prior to Admission medications   Medication Sig Start Date End Date Taking? Authorizing Provider  aspirin 81 MG tablet Take by mouth.   Yes Historical Provider, MD  atorvastatin (LIPITOR) 80 MG tablet Take 1 tablet daily 07/22/15  Yes Lillia Lengel Maceo Pro., MD  cyanocobalamin 1000 MCG tablet Take by mouth.   Yes Historical Provider, MD  ENTRESTO 24-26 MG  09/13/15  Yes Historical Provider, MD  furosemide (LASIX) 40 MG tablet Take 1 tablet (40 mg total) by mouth daily. 04/06/15  Yes Brittanee Ghazarian Maceo Pro., MD  hydrochlorothiazide (HYDRODIURIL) 25 MG tablet Take 1 tablet (25 mg total) by mouth daily. 06/29/15  Yes Amaziah Ghosh Maceo Pro., MD  loratadine (CLARITIN) 10 MG tablet Take by mouth.   Yes Historical Provider, MD  metoprolol succinate (TOPROL-XL) 25 MG 24 hr tablet Take 1 tablet (25 mg total) by mouth daily. 04/06/15  Yes Mitchell Epling Maceo Pro., MD  MULTIPLE VITAMIN PO Take by mouth.   Yes  Historical Provider, MD  nitroGLYCERIN (NITROSTAT) 0.4 MG SL tablet Place under the tongue. 02/03/15 02/03/16 Yes Historical Provider, MD  omeprazole (PRILOSEC) 20 MG capsule Take 1 capsule (20 mg total) by mouth daily. 06/29/15  Yes Loveta Dellis Maceo Pro., MD  potassium chloride (K-DUR) 10 MEQ tablet Take 1 tablet by mouth two  times daily 09/09/15  Yes Amon Costilla Maceo Pro., MD  sertraline (ZOLOFT) 50 MG tablet Take 1 tablet (50 mg total) by mouth daily. 06/22/15  Yes Fanta Wimberley Maceo Pro., MD    Patient Active Problem List   Diagnosis Date Noted  . Pre-diabetes 07/20/2015  . Acute anxiety 07/20/2015  . Cardiomyopathy ([place]) 02/28/2015  . 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 GERD (gastroesophageal reflux disease) 02/28/2015  . Hypercholesterolemia 02/28/2015  . Hypertension 02/28/2015  . Cardiac defibrillator in place 02/28/2015  . Block, bundle branch, left 02/28/2015  . Adiposity 02/28/2015  . Acid reflux 02/28/2015  . 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 Chronic systolic heart failure ([place]) 02/24/2015  . H/O cardiac catheterization 02/24/2015  . 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 Diabetes mellitus, type 2 ([place]) 01/29/2015  . Angina pectoris ([place]) 12/25/2013  . Arthritis 12/25/2013  . 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 No pertinent past medical history.  Social History   Social History  . Marital Status: Married    Spouse Name: N/A  . Number of Children: N/A  . Years of Education:  N/A   Occupational History  . Not on file.   Social History Main Topics  . Smoking status: Never Smoker   . Smokeless tobacco: Never Used  . Alcohol Use: No  . Drug Use: No  . Sexual Activity: No   Other Topics Concern  . Not on file   Social History Narrative    Allergies  Allergen Reactions  . Iodinated Diagnostic Agents Other (See Comments)  . Iodine     Contrast Dye  . Penicillins     Review of Systems    Constitutional: Negative.   HENT: Negative.   Eyes: Negative.   Respiratory: Negative.   Cardiovascular: Negative.   Gastrointestinal: Negative.   Genitourinary: Negative.   Musculoskeletal: Negative.   Skin: Negative.   Neurological: Negative.   Endo/Heme/Allergies: Negative.   Psychiatric/Behavioral: Negative.     Immunization History  Administered Date(s) Administered  . Influenza, High Dose Seasonal PF 06/24/2015   Objective:  BP 118/60 mmHg  Pulse 78  Temp(Src) 98.3 F (36.8 C) (Oral)  Resp 16  Wt 249 lb (112.946 kg)  Physical Exam  Constitutional: She is oriented to person, place, and time and well-developed, well-nourished, and in no distress.  HENT:  Head: Normocephalic and atraumatic.  Right Ear: External ear normal.  Left Ear: External ear normal.  Nose: Nose normal.  Eyes: Conjunctivae and EOM are normal. Pupils are equal, round, and reactive to light.  Neck: Normal range of motion. Neck supple.  Cardiovascular: Normal rate, regular rhythm, normal heart sounds and intact distal pulses.   Pulmonary/Chest: Effort normal and breath sounds normal.  Abdominal: Soft.  Musculoskeletal: Normal range of motion.  Neurological: She is alert and oriented to person, place, and time. She has normal reflexes. Gait normal. GCS score is 15.  Skin: Skin is warm and dry.  Psychiatric: Mood, memory, affect and judgment normal.    Lab Results  Component Value Date   WBC 6.6 07/21/2015   HGB 12.5 09/29/2014   HCT 36.2 07/21/2015   PLT 292 07/21/2015   GLUCOSE 110* 07/21/2015   CHOL 217* 07/21/2015   TRIG 128 07/21/2015   HDL 55 07/21/2015   LDLCALC 136* 07/21/2015   TSH 2.020 07/21/2015   HGBA1C 5.9* 07/21/2015    CMP     Component Value Date/Time   NA 141 07/21/2015 1026   NA 140 09/29/2014 1128   K 3.4* 08/18/2015 0804   K 3.6 09/29/2014 1128   CL 93* 07/21/2015 1026   CL 103 09/29/2014 1128   CO2 31* 07/21/2015 1026   CO2 32 09/29/2014 1128   GLUCOSE  110* 07/21/2015 1026   GLUCOSE 84 09/29/2014 1128   BUN 14 07/21/2015 1026   BUN 16 09/29/2014 1128   CREATININE 0.91 07/21/2015 1026   CREATININE 1.10 09/29/2014 1128   CALCIUM 9.6 07/21/2015 1026   CALCIUM 9.2 09/29/2014 1128   PROT 6.7 07/21/2015 1026   ALBUMIN 4.1 07/21/2015 1026   AST 19 07/21/2015 1026   ALT 16 07/21/2015 1026   ALKPHOS 104 07/21/2015 1026   BILITOT 0.4 07/21/2015 1026   GFRNONAA 65 07/21/2015 1026   GFRNONAA 52* 09/29/2014 1128   GFRAA 74 07/21/2015 1026   GFRAA >60 09/29/2014 1128    Assessment and Plan :  1. Essential hypertension - CBC with Differential/Platelet - TSH - Comprehensive metabolic panel  2. Chronic systolic heart failure ([place])   3. Hypercholesterolemia  - Lipid Panel With LDL/HDL Ratio - Comprehensive metabolic panel  4. Hyperglycemia  -  Hemoglobin A1c - Comprehensive metabolic panel 5.complete heart block--followed at [facility]. 6. Ischemic cardiomyopathy with EF of 25% I have done the exam and reviewed the above chart and it is accurate to the best of my knowledge.   Miguel Aschoff MD [facility] 01/18/2016 11:38 AM

## 2016-02-15 DIAGNOSIS — I429 Cardiomyopathy, unspecified: Secondary | ICD-10-CM | POA: Diagnosis not present

## 2016-02-15 DIAGNOSIS — Z45018 Encounter for adjustment and management of other part of cardiac pacemaker: Secondary | ICD-10-CM | POA: Diagnosis not present

## 2016-03-14 ENCOUNTER — Telehealth: Payer: Self-pay | Admitting: Emergency Medicine

## 2016-03-14 NOTE — Telephone Encounter (Signed)
Pt returning call. I did not see where anyone called her but said someone did.

## 2016-03-25 ENCOUNTER — Other Ambulatory Visit: Payer: Self-pay | Admitting: Family Medicine

## 2016-03-27 ENCOUNTER — Other Ambulatory Visit: Payer: Self-pay

## 2016-03-27 DIAGNOSIS — I10 Essential (primary) hypertension: Secondary | ICD-10-CM

## 2016-03-27 MED ORDER — METOPROLOL SUCCINATE ER 25 MG PO TB24: 25.0000 mg | ORAL_TABLET | Freq: Every day | ORAL | Status: DC

## 2016-04-06 DIAGNOSIS — R739 Hyperglycemia, unspecified: Secondary | ICD-10-CM | POA: Diagnosis not present

## 2016-04-06 DIAGNOSIS — I10 Essential (primary) hypertension: Secondary | ICD-10-CM | POA: Diagnosis not present

## 2016-04-06 DIAGNOSIS — E7800 Pure hypercholesterolemia, unspecified: Secondary | ICD-10-CM | POA: Diagnosis not present

## 2016-04-07 ENCOUNTER — Telehealth: Payer: Self-pay

## 2016-04-07 LAB — COMPREHENSIVE METABOLIC PANEL
A/G RATIO: 1.5 (ref 1.2–2.2)
ALBUMIN: 3.8 g/dL (ref 3.5–4.8)
ALK PHOS: 121 IU/L — AB (ref 39–117)
ALT: 13 IU/L (ref 0–32)
AST: 19 IU/L (ref 0–40)
BUN / CREAT RATIO: 16 (ref 12–28)
BUN: 16 mg/dL (ref 8–27)
Bilirubin Total: 0.4 mg/dL (ref 0.0–1.2)
CO2: 29 mmol/L (ref 18–29)
Calcium: 9.3 mg/dL (ref 8.7–10.3)
Chloride: 100 mmol/L (ref 96–106)
Creatinine, Ser: 0.99 mg/dL (ref 0.57–1.00)
GFR calc Af Amer: 67 mL/min/{1.73_m2} (ref >59)
GFR, EST NON AFRICAN AMERICAN: 58 mL/min/{1.73_m2} — AB (ref >59)
GLOBULIN, TOTAL: 2.5 g/dL (ref 1.5–4.5)
Glucose: 105 mg/dL — ABNORMAL HIGH (ref 65–99)
POTASSIUM: 3.4 mmol/L — AB (ref 3.5–5.2)
SODIUM: 146 mmol/L — AB (ref 134–144)
Total Protein: 6.3 g/dL (ref 6.0–8.5)

## 2016-04-07 LAB — HEMOGLOBIN A1C
Est. average glucose Bld gHb Est-mCnc: 120 mg/dL
Hgb A1c MFr Bld: 5.8 % — ABNORMAL HIGH (ref 4.8–5.6)

## 2016-04-07 LAB — CBC WITH DIFFERENTIAL/PLATELET
BASOS: 0 %
Basophils Absolute: 0 10*3/uL (ref 0.0–0.2)
EOS (ABSOLUTE): 0.3 10*3/uL (ref 0.0–0.4)
EOS: 4 %
HEMATOCRIT: 36.8 % (ref 34.0–46.6)
HEMOGLOBIN: 12.2 g/dL (ref 11.1–15.9)
IMMATURE GRANULOCYTES: 0 %
Immature Grans (Abs): 0 10*3/uL (ref 0.0–0.1)
LYMPHS ABS: 1.7 10*3/uL (ref 0.7–3.1)
Lymphs: 25 %
MCH: 27.9 pg (ref 26.6–33.0)
MCHC: 33.2 g/dL (ref 31.5–35.7)
MCV: 84 fL (ref 79–97)
MONOCYTES: 7 %
Monocytes Absolute: 0.4 10*3/uL (ref 0.1–0.9)
Neutrophils Absolute: 4.1 10*3/uL (ref 1.4–7.0)
Neutrophils: 64 %
Platelets: 250 10*3/uL (ref 150–379)
RBC: 4.37 x10E6/uL (ref 3.77–5.28)
RDW: 14.3 % (ref 12.3–15.4)
WBC: 6.5 10*3/uL (ref 3.4–10.8)

## 2016-04-07 LAB — LIPID PANEL WITH LDL/HDL RATIO
CHOLESTEROL TOTAL: 183 mg/dL (ref 100–199)
HDL: 52 mg/dL (ref >39)
LDL CALC: 108 mg/dL — AB (ref 0–99)
LDL/HDL RATIO: 2.1 ratio (ref 0.0–3.2)
TRIGLYCERIDES: 116 mg/dL (ref 0–149)
VLDL Cholesterol Cal: 23 mg/dL (ref 5–40)

## 2016-04-07 LAB — TSH: TSH: 2.99 u[IU]/mL (ref 0.450–4.500)

## 2016-04-07 NOTE — Telephone Encounter (Signed)
-----   Message from Jerrol Banana., MD sent at 04/07/2016  9:34 AM EDT ----- Labs stable.

## 2016-04-07 NOTE — Telephone Encounter (Signed)
Patient advised as below.  

## 2016-04-28 DIAGNOSIS — K219 Gastro-esophageal reflux disease without esophagitis: Secondary | ICD-10-CM | POA: Diagnosis not present

## 2016-04-28 DIAGNOSIS — J310 Chronic rhinitis: Secondary | ICD-10-CM | POA: Diagnosis not present

## 2016-05-16 DIAGNOSIS — I429 Cardiomyopathy, unspecified: Secondary | ICD-10-CM | POA: Diagnosis not present

## 2016-05-16 DIAGNOSIS — M79671 Pain in right foot: Secondary | ICD-10-CM | POA: Diagnosis not present

## 2016-05-16 DIAGNOSIS — Z45018 Encounter for adjustment and management of other part of cardiac pacemaker: Secondary | ICD-10-CM | POA: Diagnosis not present

## 2016-05-16 DIAGNOSIS — M7751 Other enthesopathy of right foot: Secondary | ICD-10-CM | POA: Diagnosis not present

## 2016-05-16 DIAGNOSIS — Q6621 Congenital metatarsus primus varus: Secondary | ICD-10-CM | POA: Diagnosis not present

## 2016-06-07 ENCOUNTER — Other Ambulatory Visit: Payer: Self-pay | Admitting: Family Medicine

## 2016-06-07 DIAGNOSIS — E7800 Pure hypercholesterolemia, unspecified: Secondary | ICD-10-CM

## 2016-06-09 ENCOUNTER — Other Ambulatory Visit: Payer: Self-pay

## 2016-06-09 DIAGNOSIS — J310 Chronic rhinitis: Secondary | ICD-10-CM | POA: Diagnosis not present

## 2016-06-09 DIAGNOSIS — I10 Essential (primary) hypertension: Secondary | ICD-10-CM

## 2016-06-09 DIAGNOSIS — K219 Gastro-esophageal reflux disease without esophagitis: Secondary | ICD-10-CM | POA: Diagnosis not present

## 2016-06-09 MED ORDER — FUROSEMIDE 40 MG PO TABS: 40.0000 mg | ORAL_TABLET | Freq: Every day | ORAL | 3 refills | Status: DC

## 2016-06-09 NOTE — Telephone Encounter (Signed)
Dr Alben Spittle patient. Please review-aa

## 2016-06-19 ENCOUNTER — Ambulatory Visit: Payer: Medicare Other | Attending: Otolaryngology | Admitting: Speech Pathology

## 2016-06-19 DIAGNOSIS — R490 Dysphonia: Secondary | ICD-10-CM | POA: Diagnosis not present

## 2016-06-20 ENCOUNTER — Encounter: Payer: Self-pay | Admitting: Family Medicine

## 2016-06-20 ENCOUNTER — Encounter: Payer: Self-pay | Admitting: Speech Pathology

## 2016-06-20 NOTE — Therapy (Signed)
[place] MAIN [facility] SERVICES [address] [place], [place], [address] Phone: [phone]   Fax:  [phone]  Speech Language Pathology Evaluation  Patient Details  Name: Anna Marsh MRN: [id number] Date of Birth: [birth year] Referring Provider: Dr. Pryor Ochoa  Encounter Date: 06/19/2016      End of Session - 06/20/16 1125    Visit Number 1   Number of Visits 9   Date for SLP Re-Evaluation 08/19/16   SLP Start Time 1500   SLP Stop Time  1600   SLP Time Calculation (min) 60 min   Activity Tolerance Patient tolerated treatment well      History reviewed. No pertinent past medical history.  Past Surgical History:  Procedure Laterality Date   ABDOMINAL HYSTERECTOMY     HERNIA REPAIR     umbilical x 2   NECK SURGERY     TONSILLECTOMY      There were no vitals filed for this visit.          SLP Evaluation [facility] - 06/20/16 0001      SLP Visit Information   SLP Received On 06/19/16   Referring Provider Dr. Pryor Ochoa   Onset Date 04/28/2016   Medical Diagnosis Dysphonia     Subjective   Subjective Patient reports irritating chronic cough triggered by smell of cigarette smoke (husband smokes outside). Also complains of developing hoarse quality of voice.   Patient/Family Stated Goal Stop coughing all the time     General Information   HPI Patient complains of chronic cough with gradual onset in 2013, characterized by tickling sensation. One noted trigger is smell of cigarette smoke. She also complains of hoarse vocal quality. The patient was diagnosed with laryngopharyngeal reflux by Dr. 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 The patient takes omeprazole daily. She also uses ipratropium bromide nasal spray up to 3x/day as needed. Dr. Pryor Ochoa also encouraged suppression of throat clearing as much as possible.  The patient reported consuming 3 cups  coffee per day and approximately 8 oz water per day. There is no pain associated with cough/throat clear or phonation.     Prior Functional Status   Cognitive/Linguistic Baseline Within functional limits     Oral Motor/Sensory Function   Overall Oral Motor/Sensory Function Appears within functional limits for tasks assessed     Motor Speech   Overall Motor Speech Impaired   Respiration Impaired   Level of Impairment Conversation   Phonation Breathy;Hoarse;Low vocal intensity   Resonance Within functional limits   Articulation Within functional limitis   Intelligibility Intelligible   Phonation Impaired   Vocal Abuses Habitual Cough/Throat Clear;Vocal Fold Dehydration   Tension Present Jaw;Neck;Shoulder   Volume Soft   Pitch Low     Standardized Assessments   Standardized Assessments  Other Assessment  Perceptual Voice Evaluation       Perceptual Voice Evaluation   Voice checklist:  Health risks - laryngopharyngeal reflux, rhinorrhea   Characteristic voice use - conversation, mostly talking on phone  Environmental risks - secondhand smoke  Misuse - poor vocal quality   Abuse - chronic cough and throat clearing   Vocal characteristics - quiet (in sustained vowel phonation), breathy, hoarse   Maximum phonation time for sustained ah: 17 sec  Average time patient was able to sustain /s/: 10 sec  Average time patient was able to sustain /z/: 18  s/z ratio : 0.56  Visi-Pitch:  Multi-Dimensional Voice Program (MDVP)  MDVP extracts objective quantitative values (Relative Average Perturbation, Shimmer, Voice Turbulence Index, and Noise to Harmonic Ratio) on sustained phonation, which are displayed graphically and numerically in comparison to a built-in normative database.  The patient exhibited values outside the norm for Relative Average Perturbation, Shimmer, Voice Turbulence Index, and Noise to Harmonic Ratio.  Average fundamental frequency was 1.45 STD below the  average for age and gender. The patient improved Relative Average Perturbation, Voice Turbulence Index, and Noise to Harmonic Ratio to within normal limits when cued to alter voicing (loud like me).   Stimulability: The patient demonstrated good stimulability for improving vocal quality following a clinician model and for improving breath support in sustained /s/ and sustained phonation following clinician instruction and models. She described making changes to breath support as challenging. The patient expressed a willingness to learn and practice at home.   Education: The patient was educated regarding vocal hygiene (including environmental triggers, hydration, and coughing/throat clearing), breath support, and desirable vocal quality using explanation, written materials, demonstration, and performance feedback. The patient was encouraged to make note of additional environmental factors contributing to cough.      SLP Education - 06/20/16 1124    Education provided Yes   Education Details Role of speech therapy in voice disorders   Person(s) Educated Patient   Methods Explanation   Comprehension Verbalized understanding            SLP Long Term Goals - 06/20/16 1131      SLP LONG TERM GOAL #1   Title The patient will demonstrate independent understanding of vocal hygiene concepts and cough rescue / distraction techniques.   Time 8   Period Weeks   Status New     SLP LONG TERM GOAL #2   Title The patient will be independent for abdominal breathing and breath support exercises.   Time 8   Period Weeks   Status New     SLP LONG TERM GOAL #3   Title The patient will maximize voice quality and loudness using breath support for sustained vowel production, pitch glides, and hierarchal speech drill.   Time 8   Period Weeks   Status New     SLP LONG TERM GOAL #4   Title The patient will maximize voice quality and loudness using breath support for paragraph length recitation with  80% accuracy.   Time 8   Period Weeks   Status New          Plan - 06/20/16 1126    Clinical Impression Statement This 70 year old woman under the care of Dr. Pryor Ochoa with unproductive cough/throat clearing and hoarse vocal quality is presenting with mild dysphonia and chronic cough. The patient demonstrates mildly hoarse, breathy, and quiet vocal quality with poor breath support and frequent urge to clear throat. She will benefit from voice therapy for education, to improve breath support, vocal quality and loudness, reduce laryngeal irritation due to maladaptive voicing, and strategies to reduce habitual cough/throat clearing.   Speech Therapy Frequency 1x /week   Duration Other (comment)  8 weeks   Treatment/Interventions Patient/family education;SLP instruction and feedback;Other (comment)  Voice therapy   Potential to Achieve Goals Good   Potential Considerations Cooperation/participation level;Previous level of function;Severity of impairments;Family/community support   Consulted and Agree with Plan of Care Patient      Patient will benefit from skilled therapeutic intervention in order to improve the following deficits and impairments:   Dysphonia - Plan: SLP  plan of care cert/re-cert      G-Codes - 99991111 1133    Functional Assessment Tool Used Perceptual voice evaluation, clinical judgment   Functional Limitations Voice   Voice Current Status (G9171) At least 40 percent but less than 60 percent impaired, limited or restricted   Voice Goal Status (G9172) At least 1 percent but less than 20 percent impaired, limited or restricted      Problem List Patient Active Problem List   Diagnosis Date Noted   Pre-diabetes 07/20/2015   Acute anxiety 07/20/2015   Cardiomyopathy ([place]) 02/28/2015   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[place]) 02/28/2015   GERD (gastroesophageal reflux disease) 02/28/2015   Hypercholesterolemia 02/28/2015   Hypertension 02/28/2015   Cardiac  defibrillator in place 02/28/2015   Block, bundle branch, left 02/28/2015   Adiposity 02/28/2015   Acid reflux 02/28/2015   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 [place], [place], [address] Phone: [phone]   Fax:  [phone]  Name: Anna Marsh MRN: [id number] Date of Birth: 1946/01/13

## 2016-06-27 ENCOUNTER — Ambulatory Visit: Payer: Medicare Other | Admitting: Speech Pathology

## 2016-06-27 DIAGNOSIS — R490 Dysphonia: Secondary | ICD-10-CM

## 2016-06-28 ENCOUNTER — Encounter: Payer: Self-pay | Admitting: Speech Pathology

## 2016-06-28 NOTE — Therapy (Signed)
[place] MAIN [facility] SERVICES [address] [place], [place], [address] Phone: [phone]   Fax:  [phone]  Speech Language Pathology Treatment  Patient Details  Name: Anna Marsh MRN: [id number] Date of Birth: Nov 17, 1945 Referring Provider: Dr. Pryor Ochoa  Encounter Date: 06/27/2016      End of Session - 06/28/16 1131    Visit Number 2   Number of Visits 9   Date for SLP Re-Evaluation 08/19/16   SLP Start Time 1600   SLP Stop Time  [id number]   SLP Time Calculation (min) 51 min      History reviewed. No pertinent past medical history.  Past Surgical History:  Procedure Laterality Date  . ABDOMINAL HYSTERECTOMY    . HERNIA REPAIR     umbilical x 2  . NECK SURGERY    . TONSILLECTOMY      There were no vitals filed for this visit.      Subjective Assessment - 06/28/16 1130    Subjective The patient reports that her husband remarked on the decrease in coughing episodes   Currently in Pain? No/denies               ADULT SLP TREATMENT - 06/28/16 0001      General Information   Behavior/Cognition Alert;Cooperative;Pleasant mood     Treatment Provided   Treatment provided Cognitive-Linquistic     Pain Assessment   Pain Assessment No/denies pain     Cognitive-Linquistic Treatment   Treatment focused on Voice   Skilled Treatment The patient was provided with written and verbal teaching regarding vocal hygiene.  The patient was provided with written and verbal teaching regarding breath support exercises.  The patient was provided with verbal and written instruction in cough distraction/rescue breathing.  Patient reports significant reduction in coughing episodes since she learned the rescue breathing technique.   Patient instructed in relaxed phonation / oral resonance. Vocal loudness and imitation help techniques.  Maintains oral resonance in reading with 75% accuracy.     Assessment / Recommendations / Plan   Plan  Continue with current plan of care     Progression Toward Goals   Progression toward goals Progressing toward goals          SLP Education - 06/28/16 1130    Education provided Yes   Education Details cough distraction   Person(s) Educated Patient   Methods Explanation   Comprehension Verbalized understanding            SLP Long Term Goals - 06/20/16 1131      SLP LONG TERM GOAL #1   Title The patient will demonstrate independent understanding of vocal hygiene concepts and cough rescue / distraction techniques.   Time 8   Period Weeks   Status New     SLP LONG TERM GOAL #2   Title The patient will be independent for abdominal breathing and breath support exercises.   Time 8   Period Weeks   Status New     SLP LONG TERM GOAL #3   Title The patient will maximize voice quality and loudness using breath support for sustained vowel production, pitch glides, and hierarchal speech drill.   Time 8   Period Weeks   Status New     SLP LONG TERM GOAL #4   Title The patient will maximize voice quality and loudness using breath support for paragraph length recitation with 80% accuracy.   Time 8   Period Weeks   Status New  Plan - 06/28/16 1131    Clinical Impression Statement  The patient is successfully decreasing coughing episodes with using the rescue breathing/pursed lip breathing strategy.  Patient able to improve vocal quality with loudness to improve oral resonance and decrease laryngeal strain.   Speech Therapy Frequency 1x /week   Duration Other (comment)   Treatment/Interventions Patient/family education;SLP instruction and feedback;Other (comment)  Voice therapy   Potential to Achieve Goals Good   Potential Considerations Cooperation/participation level;Previous level of function;Severity of impairments;Family/community support   Consulted and Agree with Plan of Care Patient      Patient will benefit from skilled therapeutic intervention in order  to improve the following deficits and impairments:   Dysphonia    Problem List Patient Active Problem List   Diagnosis Date Noted  . Pre-diabetes 07/20/2015  . Acute anxiety 07/20/2015  . Cardiomyopathy ([place]) 02/28/2015  . 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 GERD (gastroesophageal reflux disease) 02/28/2015  . Hypercholesterolemia 02/28/2015  . Hypertension 02/28/2015  . Cardiac defibrillator in place 02/28/2015  . Block, bundle branch, left 02/28/2015  . Adiposity 02/28/2015  . Acid reflux 02/28/2015  . 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 Chronic systolic heart failure ([place]) 02/24/2015  . H/O cardiac catheterization 02/24/2015  . 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 Diabetes mellitus, type 2 ([place]) 01/29/2015  . Angina pectoris ([place]) 12/25/2013  . Arthritis 12/25/2013  . Hallux abducto valgus 12/25/2013   Leroy Sea, MS/CCC- SLP  Lou Miner 06/28/2016, 11:32 AM  [place] MAIN [facility] SERVICES [address] [place], [place], [address] Phone: [phone]   Fax:  [phone]   Name: Anna Marsh MRN: [id number] Date of Birth: August 14, 1946

## 2016-07-07 ENCOUNTER — Encounter: Payer: Self-pay | Admitting: Speech Pathology

## 2016-07-07 ENCOUNTER — Ambulatory Visit: Payer: Medicare Other | Attending: Otolaryngology | Admitting: Speech Pathology

## 2016-07-07 DIAGNOSIS — R490 Dysphonia: Secondary | ICD-10-CM | POA: Insufficient documentation

## 2016-07-07 NOTE — Therapy (Signed)
[place] MAIN [facility] SERVICES [address] [place], [place], [address] Phone: [phone]   Fax:  [phone]  Speech Language Pathology Treatment/Discharge Summary  Patient Details  Name: Anna Marsh MRN: [id number] Date of Birth: 11/13/1945 Referring Provider: Dr. Pryor Ochoa  Encounter Date: 07/07/2016      End of Session - 07/07/16 1519    Visit Number 3   Number of Visits 9   Date for SLP Re-Evaluation 08/19/16   SLP Start Time 6283   SLP Stop Time  1130   SLP Time Calculation (min) 40 min      History reviewed. No pertinent past medical history.  Past Surgical History:  Procedure Laterality Date  . ABDOMINAL HYSTERECTOMY    . HERNIA REPAIR     umbilical x 2  . NECK SURGERY    . TONSILLECTOMY      There were no vitals filed for this visit.      Subjective Assessment - 07/07/16 1519    Subjective The patient reports that her husband remarked on the decrease in coughing episodes   Currently in Pain? No/denies               ADULT SLP TREATMENT - 07/07/16 0001      General Information   Behavior/Cognition Alert;Cooperative;Pleasant mood     Treatment Provided   Treatment provided Cognitive-Linquistic     Pain Assessment   Pain Assessment No/denies pain     Cognitive-Linquistic Treatment   Treatment focused on Voice   Skilled Treatment The patient was provided with written and verbal teaching regarding vocal hygiene.  The patient was provided with written and verbal teaching regarding breath support exercises.  The patient was provided with verbal and written instruction in cough distraction/rescue breathing.  Patient reports significant reduction in coughing episodes since she learned the rescue breathing technique.   Patient instructed in relaxed phonation / oral resonance. Vocal loudness and imitation help techniques.  Maintains oral resonance in reading with 75% accuracy.     Assessment / Recommendations /  Plan   Plan Discharge SLP treatment due to (comment);All goals met     Progression Toward Goals   Progression toward goals Goals met, education completed, patient discharged from [place] Education - 07/07/16 1519    Education provided Yes   Education Details straw breathing for cough distraction   Person(s) Educated Patient   Methods Explanation   Comprehension Verbalized understanding            SLP Long Term Goals - 07/07/16 1523      SLP LONG TERM GOAL #1   Title The patient will demonstrate independent understanding of vocal hygiene concepts and cough rescue / distraction techniques.   Status Achieved     SLP LONG TERM GOAL #2   Title The patient will be independent for abdominal breathing and breath support exercises.   Status Achieved     SLP LONG TERM GOAL #3   Title The patient will maximize voice quality and loudness using breath support for sustained vowel production, pitch glides, and hierarchal speech drill.   Status Achieved     SLP LONG TERM GOAL #4   Title The patient will maximize voice quality and loudness using breath support for paragraph length recitation with 80% accuracy.   Status Achieved          Plan - 07/07/16 1520    Clinical Impression Statement  The  patient is successfully decreasing coughing episodes with using the rescue breathing/pursed lip breathing strategy.  Unfortunately, the technique appears to trigger sneezing and rhinitis.  Patient instructed in alternate rescue breathing strategy (straw breathing) with hope to reduce irritation of nasal passages when trying to avert the cough.   Patient instructed in relaxed phonation / oral resonance. Vocal loudness and imitation help techniques.  Maintains oral resonance in reading and conversation with 80% accuracy.  The patient feels confident in her skills and is ready for discharge.     Speech Therapy Frequency 1x /week   Duration Other (comment)  Discharge    Treatment/Interventions Patient/family education;SLP instruction and feedback;Other (comment)   Potential to Achieve Goals Good   Potential Considerations Cooperation/participation level;Previous level of function;Severity of impairments;Family/community support   SLP Home Exercise Plan neck, shoulder, tongue, and throat stretches; cough distraction;  breath support exercises; relaxed phonation / oral resonance     Consulted and Agree with Plan of Care Patient      Patient will benefit from skilled therapeutic intervention in order to improve the following deficits and impairments:   Dysphonia      G-Codes - 2016/07/12 1523    Functional Assessment Tool Used clinical judgment   Functional Limitations Voice   Voice Current Status (G9171) At least 1 percent but less than 20 percent impaired, limited or restricted   Voice Goal Status (G9172) At least 1 percent but less than 20 percent impaired, limited or restricted   Voice Discharge Status (X5284) At least 1 percent but less than 20 percent impaired, limited or restricted      Problem List Patient Active Problem List   Diagnosis Date Noted  . Pre-diabetes 07/20/2015  . Acute anxiety 07/20/2015  . Cardiomyopathy ([place]) 02/28/2015  . 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 GERD (gastroesophageal reflux disease) 02/28/2015  . Hypercholesterolemia 02/28/2015  . Hypertension 02/28/2015  . Cardiac defibrillator in place 02/28/2015  . Block, bundle branch, left 02/28/2015  . Adiposity 02/28/2015  . Acid reflux 02/28/2015  . 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 Chronic systolic heart failure ([place]) 02/24/2015  . H/O cardiac catheterization 02/24/2015  . 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 Diabetes mellitus, type 2 ([place]) 01/29/2015  . Angina pectoris ([place]) 12/25/2013  . Arthritis 12/25/2013  . 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

## 2016-07-14 ENCOUNTER — Ambulatory Visit: Payer: Medicare Other | Admitting: Speech Pathology

## 2016-07-21 ENCOUNTER — Ambulatory Visit: Payer: Medicare Other | Admitting: Speech Pathology

## 2016-07-24 ENCOUNTER — Encounter: Payer: Self-pay | Admitting: Family Medicine

## 2016-07-24 ENCOUNTER — Ambulatory Visit (INDEPENDENT_AMBULATORY_CARE_PROVIDER_SITE_OTHER): Payer: Medicare Other | Admitting: Family Medicine

## 2016-07-24 VITALS — BP 110/54 | HR 72 | Temp 97.6°F | Resp 18 | Ht 63.0 in | Wt 250.0 lb

## 2016-07-24 DIAGNOSIS — Z23 Encounter for immunization: Secondary | ICD-10-CM

## 2016-07-24 NOTE — Progress Notes (Signed)
Patient: Anna Marsh, Female    DOB: November 06, 1945, 70 y.o.   MRN: [id number] Visit Date: 07/24/2016  Today's Provider: Wilhemena Durie, MD   Chief Complaint  Patient presents with  . Annual Exam   Subjective:   Anna Marsh is a 70 y.o. female who presents today for her Subsequent Annual Wellness Visit. She feels well. She reports exercising none. She reports she is sleeping fairly well.  01/07/2015 Colonoscopy, repeat in 5 years due to family history 01/04/2016 Mammogram Immunization History  Administered Date(s) Administered  . Influenza, High Dose Seasonal PF 06/24/2015     Review of Systems  Constitutional: Negative.   Eyes: Negative.   Respiratory: Negative.   Cardiovascular: Negative.   Gastrointestinal: Negative.   Endocrine: Negative.   Genitourinary: Negative.   Musculoskeletal: Negative.   Allergic/Immunologic: Negative.   Neurological: Positive for headaches.       Patient feels her headaches are due to eye strain and cataracts.  Hematological: Negative.   Psychiatric/Behavioral: Negative.     Patient Active Problem List   Diagnosis Date Noted  . Pre-diabetes 07/20/2015  . Acute anxiety 07/20/2015  . Cardiomyopathy ([place]) 02/28/2015  . 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 GERD (gastroesophageal reflux disease) 02/28/2015  . Hypercholesterolemia 02/28/2015  . Hypertension 02/28/2015  . Cardiac defibrillator in place 02/28/2015  . Block, bundle branch, left 02/28/2015  . Adiposity 02/28/2015  . Acid reflux 02/28/2015  . 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 Chronic systolic heart failure ([place]) 02/24/2015  . H/O cardiac catheterization 02/24/2015  . 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 Diabetes mellitus, type 2 ([place]) 01/29/2015  . Angina pectoris ([place]) 12/25/2013  . Arthritis 12/25/2013  . 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 Marital status: Married     Spouse name: N/A  . Number of children: N/A  . Years of education: N/A   Occupational History  . Not on file.   Social History Main Topics  . Smoking status: Former Smoker    Packs/day: 0.50    Years: 20.00    Quit date: 10/03/2003  . Smokeless tobacco: Never Used  . Alcohol use No  . Drug use: No  . Sexual activity: No   Other Topics Concern  . Not on file   Social History Narrative  . No narrative on file    Past Surgical History:  Procedure Laterality Date  . ABDOMINAL HYSTERECTOMY    . HERNIA REPAIR     umbilical x 2  . NECK SURGERY    . TONSILLECTOMY      Her family history includes Alcohol abuse in her sister; Arthritis in her sister and sister; Bipolar disorder in her sister; Cancer in her mother; Colon cancer in her sister; Dementia in her sister; Heart attack in her sister; Heart disease in her father; Hyperlipidemia in her father; Migraines in her brother and sister; Vascular Disease in her father.    Outpatient Encounter Prescriptions as of 07/24/2016  Medication Sig Note  . aspirin 81 MG tablet Take by mouth. 02/28/2015: Received from: [facility]  . atorvastatin (LIPITOR) 80 MG tablet Take 1 tablet by mouth  daily   . cyanocobalamin 1000 MCG tablet Take by mouth. 02/28/2015: Received from: [facility]  . ENTRESTO 24-26 MG  10/26/2015: Received from: [facility]  . furosemide (LASIX) 40 MG tablet Take 1 tablet (40 mg total) by mouth daily.   . hydrochlorothiazide (  HYDRODIURIL) 25 MG tablet Take 1 tablet by mouth  daily   . loratadine (CLARITIN) 10 MG tablet Take by mouth. 02/28/2015: Received from: [facility]  . metoprolol succinate (TOPROL-XL) 25 MG 24 hr tablet Take 1 tablet (25 mg total) by mouth daily.   . MULTIPLE VITAMIN PO Take by mouth. 02/28/2015: Received from: [facility]  . omeprazole (PRILOSEC) 20 MG capsule Take 1 capsule by mouth  daily   . potassium chloride (K-DUR) 10  MEQ tablet Take 1 tablet by mouth two  times daily   . sertraline (ZOLOFT) 50 MG tablet Take 1 tablet by mouth  daily   . nitroGLYCERIN (NITROSTAT) 0.4 MG SL tablet Place under the tongue. 05/18/2015: Received from: [facility]   No facility-administered encounter medications on file as of 07/24/2016.     Allergies  Allergen Reactions  . Iodinated Diagnostic Agents Other (See Comments)  . Iodine     Contrast Dye  . Isosorbide Dinitrate Er Other (See Comments)  . Penicillins     Patient Care Team: Jerrol Banana., MD as PCP - General (Family Medicine)  Objective:   Vitals:  Vitals:   07/24/16 1353  BP: (!) 110/54  Pulse: 72  Resp: 18  Temp: 97.6 F (36.4 C)  TempSrc: Oral  Weight: 250 lb (113.4 kg)  Height: 5\' 3"  (1.6 m)    Physical Exam  Constitutional: She is oriented to person, place, and time. She appears well-developed and well-nourished.  HENT:  Head: Normocephalic and atraumatic.  Right Ear: External ear normal.  Left Ear: External ear normal.  Nose: Nose normal.  Mouth/Throat: Oropharynx is clear and moist.  No tenderness of the temporal/temporal arteries.  Eyes: Conjunctivae and EOM are normal. Pupils are equal, round, and reactive to light.  Neck: Normal range of motion. Neck supple.  Cardiovascular: Normal rate, regular rhythm, normal heart sounds and intact distal pulses.   Pulmonary/Chest: Effort normal and breath sounds normal.  Abdominal: Soft. Bowel sounds are normal.  Musculoskeletal: Normal range of motion.  Neurological: She is alert and oriented to person, place, and time.  Skin: Skin is warm and dry.  Psychiatric: She has a normal mood and affect. Her behavior is normal. Judgment and thought content normal.    Activities of Daily Living In your present state of health, do you have any difficulty performing the following activities: 07/24/2016  Hearing? N  Vision? Y  Difficulty concentrating or making decisions? Y   Walking or climbing stairs? Y  Dressing or bathing? N  Doing errands, shopping? N  Some recent data might be hidden    Fall Risk Assessment Fall Risk  07/24/2016 05/18/2015  Falls in the past year? No No     Depression Screen PHQ 2/9 Scores 07/24/2016 05/18/2015 05/18/2015  PHQ - 2 Score 0 0 0    Cognitive Testing - 6-CIT    Year: 0 4 points  Month: 0 3 points  Memorize "Pia Mau, [address], [place]"  Time (within 1 hour:) 0 3 points  Count backwards from 20: 0 2 4 points  Name months of year: 0 2 4 points  Repeat Address: 0 2 4 6 8 10  points   Total Score: 0/28  Interpretation : Normal (0-7) Abnormal (8-28)    Assessment & Plan:     Annual Wellness Visit  Reviewed patient's Family Medical History Reviewed and updated list of patient's medical providers Assessment of cognitive impairment was done Assessed patient's functional ability  Established a written schedule for health screening [place] Completed and Reviewed  Exercise Activities and Dietary recommendations Goals    None      Immunization History  Administered Date(s) Administered  . Influenza, High Dose Seasonal PF 06/24/2015    Health Maintenance  Topic Date Due  . Hepatitis C Screening  1946-07-27  . FOOT EXAM  01/26/1956  . URINE MICROALBUMIN  01/26/1956  . TETANUS/TDAP  01/25/1965  . ZOSTAVAX  01/25/2006  . DEXA SCAN  01/26/2011  . PNA vac Low Risk Adult (1 of 2 - PCV13) 01/26/2011  . INFLUENZA VACCINE  05/02/2016  . HEMOGLOBIN A1C  10/07/2016  . OPHTHALMOLOGY EXAM  05/30/2017  . MAMMOGRAM  01/03/2018  . COLONOSCOPY  01/06/2025     Family history of colon cancer--her mother died at age 46 from colon cancer. Repeat colonoscopy 2021. Discussed health benefits of physical activity, and encouraged her to engage in regular exercise appropriate for her age and condition.  I have done the exam and reviewed the chart and it is accurate to the best of my  knowledge. Miguel Aschoff M.D. [facility]

## 2016-07-26 DIAGNOSIS — E782 Mixed hyperlipidemia: Secondary | ICD-10-CM | POA: Diagnosis not present

## 2016-07-26 DIAGNOSIS — R55 Syncope and collapse: Secondary | ICD-10-CM | POA: Diagnosis not present

## 2016-07-26 DIAGNOSIS — I209 Angina pectoris, unspecified: Secondary | ICD-10-CM | POA: Diagnosis not present

## 2016-07-26 DIAGNOSIS — I5022 Chronic systolic (congestive) heart failure: Secondary | ICD-10-CM | POA: Diagnosis not present

## 2016-07-26 DIAGNOSIS — I10 Essential (primary) hypertension: Secondary | ICD-10-CM | POA: Diagnosis not present

## 2016-07-28 ENCOUNTER — Ambulatory Visit: Payer: Medicare Other | Admitting: Speech Pathology

## 2016-07-31 DIAGNOSIS — H2513 Age-related nuclear cataract, bilateral: Secondary | ICD-10-CM | POA: Diagnosis not present

## 2016-08-15 DIAGNOSIS — Z4502 Encounter for adjustment and management of automatic implantable cardiac defibrillator: Secondary | ICD-10-CM | POA: Diagnosis not present

## 2016-08-15 DIAGNOSIS — I429 Cardiomyopathy, unspecified: Secondary | ICD-10-CM | POA: Diagnosis not present

## 2016-08-15 DIAGNOSIS — I5022 Chronic systolic (congestive) heart failure: Secondary | ICD-10-CM | POA: Diagnosis not present

## 2016-08-18 ENCOUNTER — Encounter: Payer: Self-pay | Admitting: Family Medicine

## 2016-08-18 ENCOUNTER — Ambulatory Visit
Admission: RE | Admit: 2016-08-18 | Discharge: 2016-08-18 | Disposition: A | Discharge status: Home / Self Care | Payer: Medicare Other | Source: Ambulatory Visit | Attending: Family Medicine | Admitting: Family Medicine

## 2016-08-18 ENCOUNTER — Ambulatory Visit (INDEPENDENT_AMBULATORY_CARE_PROVIDER_SITE_OTHER): Payer: Medicare Other | Admitting: Family Medicine

## 2016-08-18 ENCOUNTER — Other Ambulatory Visit: Payer: Self-pay | Admitting: Family Medicine

## 2016-08-18 VITALS — BP 128/82 | HR 66 | Temp 97.8°F | Resp 16 | Wt 252.8 lb

## 2016-08-18 DIAGNOSIS — Z981 Arthrodesis status: Secondary | ICD-10-CM | POA: Insufficient documentation

## 2016-08-18 DIAGNOSIS — M542 Cervicalgia: Secondary | ICD-10-CM | POA: Diagnosis not present

## 2016-08-18 DIAGNOSIS — R29898 Other symptoms and signs involving the musculoskeletal system: Secondary | ICD-10-CM

## 2016-08-18 DIAGNOSIS — Z9889 Other specified postprocedural states: Secondary | ICD-10-CM | POA: Insufficient documentation

## 2016-08-18 DIAGNOSIS — Z95810 Presence of automatic (implantable) cardiac defibrillator: Secondary | ICD-10-CM

## 2016-08-18 DIAGNOSIS — M4040 Postural lordosis, site unspecified: Secondary | ICD-10-CM | POA: Insufficient documentation

## 2016-08-18 DIAGNOSIS — I209 Angina pectoris, unspecified: Secondary | ICD-10-CM | POA: Diagnosis not present

## 2016-08-18 MED ORDER — NITROGLYCERIN 0.4 MG SL SUBL: 0.4000 mg | SUBLINGUAL_TABLET | SUBLINGUAL | 0 refills | Status: DC | PRN

## 2016-08-18 NOTE — Progress Notes (Signed)
Patient: Anna Marsh Female    DOB: 1945/12/18   70 y.o.   MRN: [id number] Visit Date: 08/18/2016  Today's Provider: Vernie Murders, PA   Chief Complaint  Patient presents with  . Shoulder Pain   Subjective:    Shoulder Pain   The pain is present in the right shoulder. This is a recurrent problem. The current episode started 1 to 4 weeks ago. The problem occurs intermittently. The problem has been unchanged. The quality of the pain is described as aching. Associated symptoms include a limited range of motion. The symptoms are aggravated by activity and lying down. She has tried heat and OTC ointments for the symptoms. The treatment provided mild relief. previous disc surgery  Had cervical laminectomy with some fixation screws 19 years ago by Dr. Pamala Hurry at [facility]. No residual weakness or pain until flare of right shoulder and weakness 1 week ago. No known injury.  Patient Active Problem List   Diagnosis Date Noted  . Pre-diabetes 07/20/2015  . Acute anxiety 07/20/2015  . Cardiomyopathy ([place]) 02/28/2015  . 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 GERD (gastroesophageal reflux disease) 02/28/2015  . Hypercholesterolemia 02/28/2015  . Hypertension 02/28/2015  . Cardiac defibrillator in place 02/28/2015  . Block, bundle branch, left 02/28/2015  . Adiposity 02/28/2015  . Acid reflux 02/28/2015  . 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 Chronic systolic heart failure ([place]) 02/24/2015  . H/O cardiac catheterization 02/24/2015  . 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 Diabetes mellitus, type 2 ([place]) 01/29/2015  . Angina pectoris ([place]) 12/25/2013  . Arthritis 12/25/2013  . 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 ABDOMINAL HYSTERECTOMY    . HERNIA REPAIR     umbilical x 2  . NECK SURGERY    . TONSILLECTOMY     Family History  Problem Relation Age of Onset  . Cancer Mother   .  Hyperlipidemia Father   . Heart disease Father   . Vascular Disease Father   . Arthritis Sister   . Colon cancer Sister   . Migraines Brother   . Alcohol abuse Sister   . Dementia Sister   . Bipolar disorder Sister   . Heart attack Sister     around age 58  . Arthritis Sister   . Migraines Sister    Allergies  Allergen Reactions  . Iodinated Diagnostic Agents Other (See Comments)  . Iodine     Contrast Dye  . Lisinopril Cough  . Penicillins      Previous Medications   ASPIRIN 81 MG TABLET    Take by mouth.   ATORVASTATIN (LIPITOR) 80 MG TABLET    Take 1 tablet by mouth  daily   CYANOCOBALAMIN 1000 MCG TABLET    Take by mouth.   ENTRESTO 24-26 MG       FUROSEMIDE (LASIX) 40 MG TABLET    Take 1 tablet (40 mg total) by mouth daily.   HYDROCHLOROTHIAZIDE (HYDRODIURIL) 25 MG TABLET    Take 1 tablet by mouth  daily   LORATADINE (CLARITIN) 10 MG TABLET    Take by mouth.   METOPROLOL SUCCINATE (TOPROL-XL) 25 MG 24 HR TABLET    Take 1 tablet (25 mg total) by mouth daily.   MULTIPLE VITAMIN PO    Take by mouth.   NITROGLYCERIN (NITROSTAT) 0.4 MG SL TABLET    Place under the tongue.   OMEPRAZOLE (PRILOSEC) 20 MG CAPSULE  Take 1 capsule by mouth  daily   POTASSIUM CHLORIDE (K-DUR) 10 MEQ TABLET    Take 1 tablet by mouth two  times daily   SERTRALINE (ZOLOFT) 50 MG TABLET    Take 1 tablet by mouth  daily    Review of Systems  Constitutional: Negative.   Respiratory: Negative.   Cardiovascular: Negative.   Musculoskeletal: Positive for arthralgias.    Social History  Substance Use Topics  . Smoking status: Former Smoker    Packs/day: 0.50    Years: 20.00    Quit date: 10/03/2003  . Smokeless tobacco: Never Used  . Alcohol use No   Objective:   BP 128/82 (BP Location: Right Arm, Patient Position: Sitting, Cuff Size: Large)   Pulse 66   Temp 97.8 F (36.6 C) (Oral)   Resp 16   Wt 252 lb 12.8 oz (114.7 kg)   SpO2 99%   BMI 44.78 kg/m   Physical Exam  Constitutional:  She is oriented to person, place, and time. She appears well-developed and well-nourished. No distress.  HENT:  Head: Normocephalic and atraumatic.  Right Ear: Hearing normal.  Left Ear: Hearing normal.  Nose: Nose normal.  Eyes: Conjunctivae and lids are normal. Right eye exhibits no discharge. Left eye exhibits no discharge. No scleral icterus.  Neck:  Some stiffness to extension. No loss of flexion or rotation. Well healed anterior scar at base of neck from past laminectomy.  Cardiovascular: Normal rate and regular rhythm.   Pulmonary/Chest: Effort normal. No respiratory distress.  Abdominal: Soft. Bowel sounds are normal.  Musculoskeletal:  Weakness in right bicep. Normal grip. Full ROM on the left. Slight decrease in ability to reach behind back but otherwise normal range on the right.  Neurological: She is alert and oriented to person, place, and time. She has normal reflexes.  Skin: Skin is intact. No lesion and no rash noted.  Psychiatric: She has a normal mood and affect. Her speech is normal and behavior is normal. Thought content normal.      Assessment & Plan:     1. Right arm weakness Onset over the past week without known injury. Aspercreme with Lidocaine no help. Some intermittent ache with weakness helped by heating pad. Seems to be progressing and having difficulty pouring coffee. Will get x-ray evaluation and consider referral to neurosurgeon pending report. - DG Cervical Spine Complete  2. Hx of fusion of cervical spine History of laminectomy with fusion 19 years ago by Dr. Pamala Hurry at [facility]. No chronic pain issues. Recent flare of right arm weakness. Will check C-spine x-rays. Not a candidate for MRI due to screws and pacemaker/defibrillator. May need referral back to neurosurgeon. - DG Cervical Spine Complete  3. Cardiac defibrillator in place Has pacemaker/defibrillator in the left upper chest. Followed telephonically and annually by cardiologist [facility]).  4. Angina  pectoris ([place]) No recent chest pains. Needs refill of NTG. - nitroGLYCERIN (NITROSTAT) 0.4 MG SL tablet; Place 1 tablet (0.4 mg total) under the tongue every 5 (five) minutes as needed for chest pain.  Dispense: 30 tablet; Refill: 0

## 2016-08-21 ENCOUNTER — Telehealth: Payer: Self-pay

## 2016-08-21 NOTE — Telephone Encounter (Signed)
-----   Message from 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 Some spurring and moderate narrowing of C5-6 interspace. Pacemaker and defibrillator seen in the left upper chest. Recommend referral to neurosurgeon to evaluate right arm weakness (original surgery by Dr. Pamala Hurry at [facility]).

## 2016-08-21 NOTE — Telephone Encounter (Signed)
Patient was advised she would like referral for neurosurgeon she states that Dr. Pamala Hurry would be fine to see. She would like for [place] to give her a callback on her cell phone at [phone] with appt date and time. KW

## 2016-08-21 NOTE — Progress Notes (Signed)
See order in chart for Neurosurgical referral. Thanks!

## 2016-08-29 ENCOUNTER — Ambulatory Visit (INDEPENDENT_AMBULATORY_CARE_PROVIDER_SITE_OTHER): Payer: Medicare Other | Admitting: Family Medicine

## 2016-08-29 DIAGNOSIS — Z23 Encounter for immunization: Secondary | ICD-10-CM

## 2016-08-31 ENCOUNTER — Telehealth: Payer: Self-pay | Admitting: Family Medicine

## 2016-08-31 DIAGNOSIS — R29898 Other symptoms and signs involving the musculoskeletal system: Secondary | ICD-10-CM

## 2016-08-31 NOTE — Telephone Encounter (Signed)
[facility] neurosurgery department states they are unable to accept referral without recent imaging other than x ray

## 2016-08-31 NOTE — Telephone Encounter (Signed)
Ask [facility] neurosurgery department what imaging test they recommend since Dr. Pamala Hurry did her cervical fusion procedure, she is having weakness in the right arm and has a pacemaker/defibrillator in the left upper chest?

## 2016-09-01 ENCOUNTER — Other Ambulatory Visit: Payer: Self-pay | Admitting: Family Medicine

## 2016-09-01 ENCOUNTER — Telehealth: Payer: Self-pay | Admitting: Family Medicine

## 2016-09-01 NOTE — Telephone Encounter (Signed)
MRI order placed.

## 2016-09-01 NOTE — Telephone Encounter (Signed)
There is an order in chart for MRA of neck and also an order for MRI of neck.Which is correct test ? Thanks

## 2016-09-01 NOTE — Telephone Encounter (Signed)
Only MRI of neck. Take out order for MRA.

## 2016-09-01 NOTE — Telephone Encounter (Signed)
Done

## 2016-09-01 NOTE — Telephone Encounter (Signed)
Please review-aa 

## 2016-09-01 NOTE — Telephone Encounter (Signed)
Put order in chart for MRI of neck to do before neurosurgical referral.

## 2016-09-01 NOTE — Telephone Encounter (Addendum)
[facility] neuro has never seen patient so they did not have any images or history on patient. Contacted on call radiologist Jeralyn Ruths) he advised that patient contact cardiologist to see what model pacemaker she has and see if it is safe for MR scanning. Patient advised.   Patient reports she contacted cardiologist and she has a Psychologist, counselling  pacemaker that is safe for MRI and MRA. Placed on Feb 02 2015.

## 2016-09-07 ENCOUNTER — Telehealth: Payer: Self-pay

## 2016-09-07 ENCOUNTER — Other Ambulatory Visit: Payer: Self-pay | Admitting: Family Medicine

## 2016-09-07 MED ORDER — PREDNISONE 10 MG PO TABS: 10.0000 mg | ORAL_TABLET | Freq: Every day | ORAL | 0 refills | Status: DC

## 2016-09-07 NOTE — Telephone Encounter (Signed)
Per Judson Roch she sent a copy of patient's card that has the model number of pacemaker to imaging. Imaging still requires clearance from cardiologist.

## 2016-09-07 NOTE — Telephone Encounter (Signed)
Message we got from the cardiologist office was that this was OK. Make patient aware of the hold up. Could ask cardiologist's office to send message to the radiologist.

## 2016-09-07 NOTE — Telephone Encounter (Signed)
See not on 08-31-16 of model of pacemaker/defibrillator. Safe to do MRI per cardiologist.

## 2016-09-07 NOTE — Telephone Encounter (Signed)
Order placed for MRI on 08-31-16 to be scheduled. Looks like an MRA was ordered at the same time by mistake and was canceled. Did both tests get canceled? Still want her to have the MRI of her neck?

## 2016-09-07 NOTE — Telephone Encounter (Signed)
Patient would like Prednisone RX sent to pharmacy.

## 2016-09-07 NOTE — Telephone Encounter (Signed)
Patient is requesting a RX for prednisone be sent to [facility] for shoulder inflammation. Patient is also still waiting on MRI appointment.

## 2016-09-07 NOTE — Telephone Encounter (Signed)
Per Judson Roch they are waiting on clearance from cardiologist for MRI and he will not be in the office until next week.

## 2016-09-10 NOTE — Progress Notes (Signed)
For vaccine

## 2016-09-14 ENCOUNTER — Other Ambulatory Visit: Payer: Self-pay | Admitting: Family Medicine

## 2016-09-14 NOTE — Telephone Encounter (Signed)
Dennis--are u ok with prednisone rf--I did not see MRI done yet.

## 2016-09-14 NOTE — Telephone Encounter (Signed)
Anna Marsh will be out of the office till next week, I was not sure if you were aware-aa

## 2016-09-14 NOTE — Telephone Encounter (Signed)
Pt contacted office for refill request on the following medications: predniSONE (DELTASONE) 10 MG tablet  [place]. Last Rx: 09/07/16 Pt stated that this was helping but she is starting to hurt again and would like another round to help her get through Christmas. Pt took last dose today. Please advise. Thanks TNP

## 2016-09-15 MED ORDER — PREDNISONE 10 MG PO TABS: 10.0000 mg | ORAL_TABLET | Freq: Every day | ORAL | 0 refills | Status: DC

## 2016-09-15 NOTE — Telephone Encounter (Signed)
[facility] has already placed call to cardiologist.He has been out of office

## 2016-09-15 NOTE — Telephone Encounter (Signed)
Ok noted, sending to [place] so he will know-aa

## 2016-09-15 NOTE — Telephone Encounter (Signed)
Please check to see if MRI is done or scheduled. See note on 08-31-16 regarding pacemaker/defibrillator being safe for MRI per cardiologist (has a [employer] [phone] pacemaker). Refill prednisone 10 mg taper.

## 2016-09-15 NOTE — Telephone Encounter (Signed)
Judson Roch can you work on MRI appointment the order is in already. Thank you-aa

## 2016-09-15 NOTE — Telephone Encounter (Signed)
[facility] is waiting on clearance from cardiologist before MRI can be scheduled

## 2016-09-15 NOTE — Telephone Encounter (Signed)
Please check to see if MRI is done or scheduled. See note on 08-31-16 regarding pacemaker/defibrillator being safe for MRI per cardiologist (has a [employer] [phone] pacemaker). Refill prednisone 10 mg taper.  Above is message from [facility] earlier today or do they need cardiology to call them there? I am trying to help with this -aa

## 2016-09-15 NOTE — Telephone Encounter (Signed)
[name] in referrals a note about getting this set up-aa

## 2016-09-22 ENCOUNTER — Telehealth: Payer: Self-pay | Admitting: Family Medicine

## 2016-09-22 NOTE — Telephone Encounter (Signed)
Anna Marsh with [facility] left message on voicemail that she has been contacted by someone in our office about getting cardiac clearance for pt to get an MRI. She said that she had sent back a message about getting the clearance and [name] RN advised her that our office needs to send a form requesting cardiac clearance to Fax# [phone]. Anna Marsh didn't leave her contact info on the voicemail. Thanks TNP

## 2016-09-26 NOTE — Telephone Encounter (Signed)
Anna Huh Do you know what is going on with this patient? Thanks

## 2016-09-26 NOTE — Telephone Encounter (Signed)
Please advise 

## 2016-09-27 ENCOUNTER — Other Ambulatory Visit: Payer: Self-pay | Admitting: Family Medicine

## 2016-09-29 ENCOUNTER — Telehealth: Payer: Self-pay | Admitting: Family Medicine

## 2016-09-29 MED ORDER — PREDNISONE 10 MG PO TABS: ORAL_TABLET | ORAL | 0 refills | Status: DC

## 2016-09-29 NOTE — Telephone Encounter (Signed)
Pt needs refill on the prednisone .  Her shoulder is hurting and she has an appt. In [place].  Her pharmacy is [address]  Her call back is [phone]  Thanks teri

## 2016-09-29 NOTE — Telephone Encounter (Signed)
She developed right arm/bicep weakness in October 2017 with right shoulder pain and progressing to difficulty even pouring a cup of coffee. She had C-spine laminectomy and fusion 19 years ago. Did not feel comfortable getting MRI of he neck due to placement of a pacemaker/defibrillator in the left upper chest in 2016 and tried to get her seen by her neurosurgeon. 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 Could not get her in to see the neurosurgeon without an up to date MRI. Radiologist wanted contact from her cardiologist regarding the model of her pacemaker ([place]) and the cardiologist sent a letter that indicated it was safe to get the MRI. Radiology wanted a direct contact from the cardiologist but he was out of the office. It was suggested she get a cardiologist at [facility] to evaluate for MRI clearance since the neurosurgeon is at [facility]. Chart indicates a referral to a [facility] cardiologist was scheduled for 10-09-16 at 10:00am. Erick Blinks is aware of this situation and should have some information about this situation.

## 2016-09-29 NOTE — Telephone Encounter (Signed)
Patient advised-aa 

## 2016-09-29 NOTE — Telephone Encounter (Signed)
Sent in new prednisone Rx to [facility] [place]

## 2016-09-29 NOTE — Telephone Encounter (Signed)
Please review-aa 

## 2016-10-03 NOTE — Telephone Encounter (Signed)
Raquel Sarna,  Can you work on this.  It was sent to [facility] when I was on Triage last Tues.  I am not sure where they are with this but since you are with him today, do you mind? I am with Dr Darnell Level. Today.   Thanks [employer]

## 2016-10-04 NOTE — Telephone Encounter (Signed)
Patient is scheduled for a MRI on Monday at [facility]

## 2016-10-09 DIAGNOSIS — M5021 Other cervical disc displacement,  high cervical region: Secondary | ICD-10-CM | POA: Diagnosis not present

## 2016-10-09 DIAGNOSIS — M9971 Connective tissue and disc stenosis of intervertebral foramina of cervical region: Secondary | ICD-10-CM | POA: Diagnosis not present

## 2016-10-09 DIAGNOSIS — M47812 Spondylosis without myelopathy or radiculopathy, cervical region: Secondary | ICD-10-CM | POA: Diagnosis not present

## 2016-10-09 DIAGNOSIS — R29898 Other symptoms and signs involving the musculoskeletal system: Secondary | ICD-10-CM | POA: Diagnosis not present

## 2016-10-10 DIAGNOSIS — K219 Gastro-esophageal reflux disease without esophagitis: Secondary | ICD-10-CM | POA: Diagnosis not present

## 2016-10-10 DIAGNOSIS — R05 Cough: Secondary | ICD-10-CM | POA: Diagnosis not present

## 2016-10-26 ENCOUNTER — Encounter: Payer: Self-pay | Admitting: Family Medicine

## 2016-10-30 ENCOUNTER — Other Ambulatory Visit: Payer: Self-pay | Admitting: Family Medicine

## 2016-10-30 MED ORDER — PREDNISONE 10 MG PO TABS: ORAL_TABLET | ORAL | 0 refills | Status: DC

## 2016-10-30 NOTE — Telephone Encounter (Signed)
Pt contacted office for refill request on the following medications: Pt saw Simona Huh on 08/18/16 for shoulder pain and was given predniSONE (DELTASONE) 10 MG tablet(Last Rx 09/29/16). Pt stated her shoulder is hurting really bad and would like predniSONE (DELTASONE) 10 MG tablet sent to [facility]. Please advise. Thanks TNP

## 2016-11-07 ENCOUNTER — Emergency Department: Payer: Medicare Other

## 2016-11-07 ENCOUNTER — Observation Stay
Admission: EM | Admit: 2016-11-07 | Discharge: 2016-11-09 | Disposition: A | Discharge status: Home / Self Care | Payer: Medicare Other | Attending: Internal Medicine | Admitting: Internal Medicine

## 2016-11-07 ENCOUNTER — Encounter: Payer: Self-pay | Admitting: Emergency Medicine

## 2016-11-07 DIAGNOSIS — G43909 Migraine, unspecified, not intractable, without status migrainosus: Secondary | ICD-10-CM | POA: Insufficient documentation

## 2016-11-07 DIAGNOSIS — Z6841 Body Mass Index (BMI) 40.0 and over, adult: Secondary | ICD-10-CM | POA: Insufficient documentation

## 2016-11-07 DIAGNOSIS — M1990 Unspecified osteoarthritis, unspecified site: Secondary | ICD-10-CM | POA: Insufficient documentation

## 2016-11-07 DIAGNOSIS — I10 Essential (primary) hypertension: Secondary | ICD-10-CM | POA: Diagnosis not present

## 2016-11-07 DIAGNOSIS — Z95810 Presence of automatic (implantable) cardiac defibrillator: Secondary | ICD-10-CM | POA: Insufficient documentation

## 2016-11-07 DIAGNOSIS — Z8673 Personal history of transient ischemic attack (TIA), and cerebral infarction without residual deficits: Secondary | ICD-10-CM | POA: Diagnosis not present

## 2016-11-07 DIAGNOSIS — R4781 Slurred speech: Secondary | ICD-10-CM | POA: Diagnosis not present

## 2016-11-07 DIAGNOSIS — E876 Hypokalemia: Secondary | ICD-10-CM | POA: Diagnosis not present

## 2016-11-07 DIAGNOSIS — Z800 Family history of malignant neoplasm of digestive organs: Secondary | ICD-10-CM | POA: Insufficient documentation

## 2016-11-07 DIAGNOSIS — Z880 Allergy status to penicillin: Secondary | ICD-10-CM | POA: Diagnosis not present

## 2016-11-07 DIAGNOSIS — I252 Old myocardial infarction: Secondary | ICD-10-CM | POA: Diagnosis not present

## 2016-11-07 DIAGNOSIS — F419 Anxiety disorder, unspecified: Secondary | ICD-10-CM | POA: Insufficient documentation

## 2016-11-07 DIAGNOSIS — I6523 Occlusion and stenosis of bilateral carotid arteries: Secondary | ICD-10-CM | POA: Insufficient documentation

## 2016-11-07 DIAGNOSIS — G459 Transient cerebral ischemic attack, unspecified: Secondary | ICD-10-CM | POA: Diagnosis not present

## 2016-11-07 DIAGNOSIS — F329 Major depressive disorder, single episode, unspecified: Secondary | ICD-10-CM | POA: Diagnosis not present

## 2016-11-07 DIAGNOSIS — I429 Cardiomyopathy, unspecified: Secondary | ICD-10-CM | POA: Diagnosis not present

## 2016-11-07 DIAGNOSIS — I071 Rheumatic tricuspid insufficiency: Secondary | ICD-10-CM | POA: Insufficient documentation

## 2016-11-07 DIAGNOSIS — E669 Obesity, unspecified: Secondary | ICD-10-CM | POA: Diagnosis not present

## 2016-11-07 DIAGNOSIS — Z7982 Long term (current) use of aspirin: Secondary | ICD-10-CM | POA: Insufficient documentation

## 2016-11-07 DIAGNOSIS — I442 Atrioventricular block, complete: Secondary | ICD-10-CM | POA: Diagnosis not present

## 2016-11-07 DIAGNOSIS — Z90710 Acquired absence of both cervix and uterus: Secondary | ICD-10-CM | POA: Insufficient documentation

## 2016-11-07 DIAGNOSIS — Z8261 Family history of arthritis: Secondary | ICD-10-CM | POA: Insufficient documentation

## 2016-11-07 DIAGNOSIS — E119 Type 2 diabetes mellitus without complications: Secondary | ICD-10-CM | POA: Insufficient documentation

## 2016-11-07 DIAGNOSIS — R200 Anesthesia of skin: Secondary | ICD-10-CM

## 2016-11-07 DIAGNOSIS — Z820 Family history of epilepsy and other diseases of the nervous system: Secondary | ICD-10-CM | POA: Insufficient documentation

## 2016-11-07 DIAGNOSIS — Z87891 Personal history of nicotine dependence: Secondary | ICD-10-CM | POA: Insufficient documentation

## 2016-11-07 DIAGNOSIS — I447 Left bundle-branch block, unspecified: Secondary | ICD-10-CM | POA: Diagnosis not present

## 2016-11-07 DIAGNOSIS — Z888 Allergy status to other drugs, medicaments and biological substances status: Secondary | ICD-10-CM | POA: Insufficient documentation

## 2016-11-07 DIAGNOSIS — Z8249 Family history of ischemic heart disease and other diseases of the circulatory system: Secondary | ICD-10-CM | POA: Insufficient documentation

## 2016-11-07 DIAGNOSIS — E785 Hyperlipidemia, unspecified: Secondary | ICD-10-CM | POA: Diagnosis not present

## 2016-11-07 DIAGNOSIS — K219 Gastro-esophageal reflux disease without esophagitis: Secondary | ICD-10-CM | POA: Insufficient documentation

## 2016-11-07 DIAGNOSIS — I5022 Chronic systolic (congestive) heart failure: Secondary | ICD-10-CM | POA: Insufficient documentation

## 2016-11-07 DIAGNOSIS — M6281 Muscle weakness (generalized): Secondary | ICD-10-CM

## 2016-11-07 DIAGNOSIS — Z91041 Radiographic dye allergy status: Secondary | ICD-10-CM | POA: Diagnosis not present

## 2016-11-07 DIAGNOSIS — Z818 Family history of other mental and behavioral disorders: Secondary | ICD-10-CM | POA: Insufficient documentation

## 2016-11-07 DIAGNOSIS — Z7902 Long term (current) use of antithrombotics/antiplatelets: Secondary | ICD-10-CM | POA: Insufficient documentation

## 2016-11-07 DIAGNOSIS — E7801 Familial hypercholesterolemia: Secondary | ICD-10-CM | POA: Insufficient documentation

## 2016-11-07 DIAGNOSIS — Z811 Family history of alcohol abuse and dependence: Secondary | ICD-10-CM | POA: Insufficient documentation

## 2016-11-07 DIAGNOSIS — Z79899 Other long term (current) drug therapy: Secondary | ICD-10-CM | POA: Insufficient documentation

## 2016-11-07 DIAGNOSIS — R202 Paresthesia of skin: Secondary | ICD-10-CM | POA: Diagnosis not present

## 2016-11-07 HISTORY — DX: Pure hypercholesterolemia, unspecified: E78.00

## 2016-11-07 HISTORY — DX: Personal history of sudden cardiac arrest: Z86.74

## 2016-11-07 HISTORY — DX: Essential (primary) hypertension: I10

## 2016-11-07 LAB — BASIC METABOLIC PANEL
Anion gap: 11 (ref 5–15)
BUN: 19 mg/dL (ref 6–20)
CHLORIDE: 96 mmol/L — AB (ref 101–111)
CO2: 33 mmol/L — ABNORMAL HIGH (ref 22–32)
CREATININE: 0.99 mg/dL (ref 0.44–1.00)
Calcium: 9.3 mg/dL (ref 8.9–10.3)
GFR calc Af Amer: >60 mL/min (ref >60)
GFR, EST NON AFRICAN AMERICAN: 56 mL/min — AB (ref >60)
Glucose, Bld: 104 mg/dL — ABNORMAL HIGH (ref 65–99)
POTASSIUM: 3.2 mmol/L — AB (ref 3.5–5.1)
Sodium: 140 mmol/L (ref 135–145)

## 2016-11-07 LAB — CBC WITH DIFFERENTIAL/PLATELET
Basophils Absolute: 0 10*3/uL (ref 0–0.1)
Basophils Relative: 1 %
EOS ABS: 0.2 10*3/uL (ref 0–0.7)
EOS PCT: 2 %
HCT: 39.3 % (ref 35.0–47.0)
Hemoglobin: 13.1 g/dL (ref 12.0–16.0)
LYMPHS PCT: 30 %
Lymphs Abs: 2.4 10*3/uL (ref 1.0–3.6)
MCH: 28.4 pg (ref 26.0–34.0)
MCHC: 33.3 g/dL (ref 32.0–36.0)
MCV: 85.4 fL (ref 80.0–100.0)
Monocytes Absolute: 0.5 10*3/uL (ref 0.2–0.9)
Monocytes Relative: 6 %
Neutro Abs: 4.9 10*3/uL (ref 1.4–6.5)
Neutrophils Relative %: 61 %
PLATELETS: 257 10*3/uL (ref 150–440)
RBC: 4.6 MIL/uL (ref 3.80–5.20)
RDW: 14.9 % — ABNORMAL HIGH (ref 11.5–14.5)
WBC: 8 10*3/uL (ref 3.6–11.0)

## 2016-11-07 LAB — TROPONIN I: Troponin I: <0.03 ng/mL (ref <0.03)

## 2016-11-07 MED ORDER — ATORVASTATIN CALCIUM 20 MG PO TABS
80.0000 mg | ORAL_TABLET | Freq: Every day | ORAL | Status: DC
  Administered 2016-11-08 – 2016-11-09 (×2): 80 mg via ORAL
  Filled 2016-11-07 (×2): qty 4

## 2016-11-07 MED ORDER — ACETAMINOPHEN 650 MG RE SUPP: 650.0000 mg | RECTAL | Status: DC | PRN

## 2016-11-07 MED ORDER — SENNOSIDES-DOCUSATE SODIUM 8.6-50 MG PO TABS: 1.0000 | ORAL_TABLET | Freq: Every evening | ORAL | Status: DC | PRN

## 2016-11-07 MED ORDER — ASPIRIN 81 MG PO CHEW
81.0000 mg | CHEWABLE_TABLET | Freq: Every day | ORAL | Status: DC
  Administered 2016-11-08 – 2016-11-09 (×2): 81 mg via ORAL
  Filled 2016-11-07 (×2): qty 1

## 2016-11-07 MED ORDER — SODIUM CHLORIDE 0.9 % IV SOLN
INTRAVENOUS | Status: DC
  Administered 2016-11-08 (×2): via INTRAVENOUS

## 2016-11-07 MED ORDER — NITROGLYCERIN 0.4 MG SL SUBL: 0.4000 mg | SUBLINGUAL_TABLET | SUBLINGUAL | Status: DC | PRN

## 2016-11-07 MED ORDER — ACETAMINOPHEN 325 MG PO TABS: 650.0000 mg | ORAL_TABLET | ORAL | Status: DC | PRN

## 2016-11-07 MED ORDER — ADULT MULTIVITAMIN W/MINERALS CH
1.0000 | ORAL_TABLET | Freq: Every day | ORAL | Status: DC
  Administered 2016-11-08: 1 via ORAL
  Filled 2016-11-07: qty 1

## 2016-11-07 MED ORDER — VITAMIN B-12 1000 MCG PO TABS
1000.0000 ug | ORAL_TABLET | Freq: Every day | ORAL | Status: DC
  Administered 2016-11-08 – 2016-11-09 (×2): 1000 ug via ORAL
  Filled 2016-11-07 (×2): qty 1

## 2016-11-07 MED ORDER — STROKE: EARLY STAGES OF RECOVERY BOOK
Freq: Once | Status: AC
  Administered 2016-11-08

## 2016-11-07 MED ORDER — POTASSIUM CHLORIDE CRYS ER 10 MEQ PO TBCR
10.0000 meq | EXTENDED_RELEASE_TABLET | Freq: Two times a day (BID) | ORAL | Status: DC
  Administered 2016-11-08 – 2016-11-09 (×4): 10 meq via ORAL
  Filled 2016-11-07 (×4): qty 1

## 2016-11-07 MED ORDER — ASPIRIN 325 MG PO TABS
325.0000 mg | ORAL_TABLET | Freq: Once | ORAL | Status: AC
  Administered 2016-11-08: 325 mg via ORAL
  Filled 2016-11-07 (×2): qty 1

## 2016-11-07 MED ORDER — SERTRALINE HCL 50 MG PO TABS
50.0000 mg | ORAL_TABLET | Freq: Every day | ORAL | Status: DC
  Administered 2016-11-08 – 2016-11-09 (×2): 50 mg via ORAL
  Filled 2016-11-07 (×2): qty 1

## 2016-11-07 MED ORDER — PANTOPRAZOLE SODIUM 40 MG PO TBEC
40.0000 mg | DELAYED_RELEASE_TABLET | Freq: Every day | ORAL | Status: DC
  Administered 2016-11-08 – 2016-11-09 (×2): 40 mg via ORAL
  Filled 2016-11-07 (×2): qty 1

## 2016-11-07 MED ORDER — LORATADINE 10 MG PO TABS
10.0000 mg | ORAL_TABLET | Freq: Every day | ORAL | Status: DC
  Administered 2016-11-08: 10:00:00 10 mg via ORAL
  Filled 2016-11-07 (×2): qty 1

## 2016-11-07 MED ORDER — ACETAMINOPHEN 160 MG/5ML PO SOLN: 650.0000 mg | ORAL | Status: DC | PRN

## 2016-11-07 MED ORDER — ENOXAPARIN SODIUM 40 MG/0.4ML ~~LOC~~ SOLN
40.0000 mg | Freq: Two times a day (BID) | SUBCUTANEOUS | Status: DC
  Administered 2016-11-08 – 2016-11-09 (×3): 40 mg via SUBCUTANEOUS
  Filled 2016-11-07 (×3): qty 0.4

## 2016-11-07 NOTE — Progress Notes (Signed)
Lovenox changed to 40 mg BID for BMI >40 and CrCl >30. 

## 2016-11-07 NOTE — H&P (Signed)
[place] @ [facility] Admission History and Physical Harvie Bridge, D.O.  ---------------------------------------------------------------------------------------------------------------------   PATIENT NAME: Anna Marsh MR#: [id number] DATE OF BIRTH: [birth year] DATE OF ADMISSION: 11/07/2016 PRIMARY CARE PHYSICIAN: Wilhemena Durie, MD  REQUESTING/REFERRING PHYSICIAN: ED Dr. Archie Balboa  CHIEF COMPLAINT: Chief Complaint  Patient presents with  . Numbness    HISTORY OF PRESENT ILLNESS: Anna Marsh is a 71 y.o. female with a known history of cardiac arrest, HTN, HLD was in a usual state of health until 3:30 today when she began experiencing an aura like a migraine but without any head pain associated with slurred speech, Vision disturbance including tunnel vision and left arm weakness/heaviness.  She states that she did not take aspirin in addition to her 81 mg at home this morning. Symptoms resolved by the time she arrived to the ED.  Patient states that she is back to baseline.  She states that she is under a lot of stress secondary to her husband's chronic medical conditions  Otherwise there has been no change in status. Patient has been taking medication as prescribed and there has been no recent change in medication or diet.  There has been no recent illness, travel or sick contacts.    Patient denies fevers/chills, weakness, dizziness, chest pain, shortness of breath, N/V/C/D, abdominal pain, dysuria/frequency.  EMS/ED COURSE:  Patient rec'd head CT   PAST MEDICAL HISTORY: Past Medical History:  Diagnosis Date  . History of cardiac arrest   . Hypercholesteremia   . Hypertension   Complete AV block status post pacemaker Chronic systolic heart failure with an EF of 25% Cardiac arrest status post defibrillator Hyperlipidemia Type 2 diabetes Known left bundle branch block    PAST SURGICAL HISTORY: Past Surgical History:  Procedure Laterality Date  . ABDOMINAL  HYSTERECTOMY    . HERNIA REPAIR     umbilical x 2  . NECK SURGERY    . PACEMAKER IMPLANT    . TONSILLECTOMY        SOCIAL HISTORY: Social History  Substance Use Topics  . Smoking status: Former Smoker    Packs/day: 0.50    Years: 20.00    Quit date: 10/03/2003  . Smokeless tobacco: Never Used  . Alcohol use No      FAMILY HISTORY: Family History  Problem Relation Age of Onset  . Cancer Mother   . Hyperlipidemia Father   . Heart disease Father   . Vascular Disease Father   . Arthritis Sister   . Colon cancer Sister   . Migraines Brother   . Alcohol abuse Sister   . Dementia Sister   . Bipolar disorder Sister   . Heart attack Sister     around age 37  . Arthritis Sister   . Migraines Sister      MEDICATIONS AT HOME: Prior to Admission medications   Medication Sig Start Date End Date Taking? Authorizing Provider  aspirin 81 MG tablet Take by mouth.    Historical Provider, MD  atorvastatin (LIPITOR) 80 MG tablet Take 1 tablet by mouth  daily 06/07/16   Jerrol Banana., MD  cyanocobalamin 1000 MCG tablet Take by mouth.    Historical Provider, MD  ENTRESTO 24-26 MG  09/13/15   Historical Provider, MD  furosemide (LASIX) 40 MG tablet Take 1 tablet (40 mg total) by mouth daily. 06/09/16   Mar Daring, PA-C  hydrochlorothiazide (HYDRODIURIL) 25 MG tablet Take 1 tablet by mouth  daily 03/27/16   Richard L  Cranford Mon., MD  loratadine (CLARITIN) 10 MG tablet Take by mouth.    Historical Provider, MD  metoprolol succinate (TOPROL-XL) 25 MG 24 hr tablet Take 1 tablet (25 mg total) by mouth daily. 03/27/16   Richard Maceo Pro., MD  MULTIPLE VITAMIN PO Take by mouth.    Historical Provider, MD  nitroGLYCERIN (NITROSTAT) 0.4 MG SL tablet Place 1 tablet (0.4 mg total) under the tongue every 5 (five) minutes as needed for chest pain. 08/18/16 08/18/17  Vickki Muff Chrismon, PA  omeprazole (PRILOSEC) 20 MG capsule Take 1 capsule by mouth  daily 03/27/16   Jerrol Banana.,  MD  potassium chloride (K-DUR) 10 MEQ tablet Take 1 tablet by mouth two  times daily 09/09/15   Jerrol Banana., MD  potassium chloride (K-DUR,KLOR-CON) 10 MEQ tablet TAKE 1 TABLET BY MOUTH TWO  TIMES DAILY 09/27/16   Jerrol Banana., MD  predniSONE (DELTASONE) 10 MG tablet Start with 6 tablets by mouth the first day and taper down by one tablet daily (6,5,4,3,2,1) 10/30/16   Vickki Muff Chrismon, PA  sertraline (ZOLOFT) 50 MG tablet Take 1 tablet by mouth  daily 03/27/16   Jerrol Banana., MD      DRUG ALLERGIES: Allergies  Allergen Reactions  . Iodinated Diagnostic Agents Other (See Comments)  . Iodine     Contrast Dye  . Lisinopril Cough  . Penicillins      REVIEW OF SYSTEMS: CONSTITUTIONAL: No fever/chills, fatigue, weakness, weight gain/loss, headache. Positive aura EYES: No blurry or double vision. ENT: No tinnitus, postnasal drip, redness or soreness of the oropharynx. RESPIRATORY: No cough, wheeze, hemoptysis, dyspnea. CARDIOVASCULAR: No chest pain, orthopnea, palpitations, syncope. GASTROINTESTINAL: No nausea, vomiting, constipation, diarrhea, abdominal pain, hematemesis, melena or hematochezia. GENITOURINARY: No dysuria or hematuria. ENDOCRINE: No polyuria or nocturia. No heat or cold intolerance. HEMATOLOGY: No anemia, bruising, bleeding. INTEGUMENTARY: No rashes, ulcers, lesions. MUSCULOSKELETAL: No arthritis, swelling, gout. NEUROLOGIC: Positive numbness, tingling, weakness, vision disturbances in a phase. No ataxia. No seizure-type activity. PSYCHIATRIC: No anxiety, depression, insomnia.  PHYSICAL EXAMINATION: VITAL SIGNS: Blood pressure 136/60, pulse 63, temperature 98.1 F (36.7 C), temperature source Oral, resp. rate 18, height 5\' 3"  (1.6 m), weight 111.6 kg (246 lb), SpO2 99 %.  GENERAL: 71 y.o.-year-old white female patient, well-developed, well-nourished lying in the bed in no acute distress.  Pleasant and cooperative.   HEENT: Head atraumatic,  normocephalic. Pupils equal, round, reactive to light and accommodation. No scleral icterus. Extraocular muscles intact. Nares are patent. Oropharynx is clear. Mucus membranes moist. NECK: Supple, full range of motion. No JVD, no bruit heard. No thyroid enlargement, no tenderness, no cervical lymphadenopathy. CHEST: Normal breath sounds bilaterally. No wheezing, rales, rhonchi or crackles. No use of accessory muscles of respiration.  No reproducible chest wall tenderness.  CARDIOVASCULAR: S1, S2 normal. No murmurs, rubs, or gallops. Cap refill <2 seconds. ABDOMEN: Soft, nontender, nondistended. No rebound, guarding, rigidity. Normoactive bowel sounds present in all four quadrants. No organomegaly or mass. EXTREMITIES: Full range of motion. No pedal edema, cyanosis, or clubbing. NEUROLOGIC: Cranial nerves II through XII are grossly intact with no focal sensorimotor deficit. Muscle strength 5/5 in all extremities. Sensation intact. Gait not checked. PSYCHIATRIC: The patient is alert and oriented x 3. Normal affect, mood, thought content. SKIN: Warm, dry, and intact without obvious rash, lesion, or ulcer.  LABORATORY PANEL:  CBC  Recent Labs Lab 11/07/16 1827  WBC 8.0  HGB 13.1  HCT 39.3  PLT  257   ----------------------------------------------------------------------------------------------------------------- Chemistries  Recent Labs Lab 11/07/16 1827  NA 140  K 3.2*  CL 96*  CO2 33*  GLUCOSE 104*  BUN 19  CREATININE 0.99  CALCIUM 9.3   ------------------------------------------------------------------------------------------------------------------ Cardiac Enzymes  Recent Labs Lab 11/07/16 1827  TROPONINI <0.03   ------------------------------------------------------------------------------------------------------------------  RADIOLOGY: Ct Head Wo Contrast  Result Date: 11/07/2016 CLINICAL DATA:  Numbness and tingling in the left hand with slurred speech EXAM: CT HEAD  WITHOUT CONTRAST TECHNIQUE: Contiguous axial images were obtained from the base of the skull through the vertex without intravenous contrast. COMPARISON:  None. FINDINGS: Brain: No evidence of acute infarction, hemorrhage, hydrocephalus, extra-axial collection or mass lesion/mass effect. Vascular: No hyperdense vessel or unexpected calcification. Skull: Normal. Negative for fracture or focal lesion. Sinuses/Orbits: No acute finding. Other: None. IMPRESSION: No acute abnormality noted. Electronically Signed   By: Inez Catalina M.D.   On: 11/07/2016 18:33    EKG: Paced at 69bpm with leftward axis, nonspecific St-T wave changes  IMPRESSION AND PLAN:  This is a 71 y.o. female with a history of Complete AV block status post pacemaker, Chronic systolic heart failure with an EF of 25%, Cardiac arrest status post defibrillator,Hyperlipidemia,Type 2 diabetes,Known left bundle branch block now being admitted with:  1. TIA rule out CVA -  - Admit telemetry observation for neuro workup including: - Studies: CTA (cannot have MRI due to PPM/AICD) Echo, Carotids - Labs: CBC, BMP, Lipids, TFTs, A1C - Nursing: Neurochecks, O2, dysphagia screen, permissive hypertension.  - Consults: Neurology, PT/OT, S/S consults.  - Meds: Daily aspirin 81mg .   - Fluids: IVNS@75cc /hr.   - Routine DVT Px: with Lovenox, SCDs, early ambulation  2. Hypokalemia, mild - Replcae PO  3. H/O HTN -Hold Toprol, Entresto for now  4. H/O HLD -Continue Lipitor  5. H/O CHF -hold Lasix for now   6. History of coronary artery disease status post MI -Continue aspirin, Lipitor, nitroglycerin  7. History of GERD -Protonix in place of Prilosec  8. History of depression -Continue Zoloft  Admission status: Observation, telemetry Diet: Nothing by mouth pending swallow evaluation Fluids: Normal saline  Code Status: Full Disposition: To home in less than 24 hours  All the records are reviewed and case discussed with ED provider.  Management plans discussed with the patient and/or family who express understanding and agree with plan of care.   TOTAL TIME TAKING CARE OF THIS PATIENT: 60 minutes.   Bernd Crom D.O. on 11/07/2016 at 10:31 PM Between 7am to 6pm - Pager - [phone] After 6pm go to [email or web address] - password [email or web address] [facility] [place] [facility] Office [phone] CC: Primary care physician; Wilhemena Durie, MD     Note: This dictation was prepared with Dragon dictation along with smaller phrase technology. Any transcriptional errors that result from this process are unintentional.

## 2016-11-07 NOTE — ED Provider Notes (Signed)
[facility] Emergency Department Provider Note    ____________________________________________   I have reviewed the triage vital signs and the nursing notes.   HISTORY  Chief Complaint Numbness   History limited by: Not Limited   HPI Anna Marsh is a 71 y.o. female who presents to the emergency department today because of concerns for vision change and left sided numbness. Patient states that she started developing tunnel vision today. She states that this is not completely unusual given that she has migraines and they usually present with change in vision. However the patient does state that today she also had left upper extremity numbness. In addition when she called her daughter her daughter thought that she had speech changes. By the time of my evaluation the patient stated she felt back to baseline.    Past Medical History:  Diagnosis Date  . History of cardiac arrest   . Hypercholesteremia   . Hypertension     Patient Active Problem List   Diagnosis Date Noted  . Pre-diabetes 07/20/2015  . Acute anxiety 07/20/2015  . Cardiomyopathy ([place]) 02/28/2015  . 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[place]) 02/28/2015  . GERD (gastroesophageal reflux disease) 02/28/2015  . Hypercholesterolemia 02/28/2015  . Hypertension 02/28/2015  . Cardiac defibrillator in place 02/28/2015  . Block, bundle branch, left 02/28/2015  . Adiposity 02/28/2015  . Acid reflux 02/28/2015  . Complete atrioventricular block ([place]) 02/28/2015  . Chronic systolic heart failure ([place]) 02/24/2015  . H/O cardiac catheterization 02/24/2015  . 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 Diabetes mellitus, type 2 ([place]) 01/29/2015  . Angina pectoris ([place]) 12/25/2013  . Arthritis 12/25/2013  . 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 ABDOMINAL HYSTERECTOMY    . HERNIA REPAIR     umbilical x 2  . NECK SURGERY    .  PACEMAKER IMPLANT    . TONSILLECTOMY      Prior to Admission medications   Medication Sig Start Date End Date Taking? Authorizing Provider  aspirin 81 MG tablet Take by mouth.    Historical Provider, MD  atorvastatin (LIPITOR) 80 MG tablet Take 1 tablet by mouth  daily 06/07/16   Jerrol Banana., MD  cyanocobalamin 1000 MCG tablet Take by mouth.    Historical Provider, MD  ENTRESTO 24-26 MG  09/13/15   Historical Provider, MD  furosemide (LASIX) 40 MG tablet Take 1 tablet (40 mg total) by mouth daily. 06/09/16   Mar Daring, PA-C  hydrochlorothiazide (HYDRODIURIL) 25 MG tablet Take 1 tablet by mouth  daily 03/27/16   Jerrol Banana., MD  loratadine (CLARITIN) 10 MG tablet Take by mouth.    Historical Provider, MD  metoprolol succinate (TOPROL-XL) 25 MG 24 hr tablet Take 1 tablet (25 mg total) by mouth daily. 03/27/16   Richard Maceo Pro., MD  MULTIPLE VITAMIN PO Take by mouth.    Historical Provider, MD  nitroGLYCERIN (NITROSTAT) 0.4 MG SL tablet Place 1 tablet (0.4 mg total) under the tongue every 5 (five) minutes as needed for chest pain. 08/18/16 08/18/17  Vickki Muff Chrismon, PA  omeprazole (PRILOSEC) 20 MG capsule Take 1 capsule by mouth  daily 03/27/16   Jerrol Banana., MD  potassium chloride (K-DUR) 10 MEQ tablet Take 1 tablet by mouth two  times daily 09/09/15   Jerrol Banana., MD  potassium chloride (K-DUR,KLOR-CON) 10 MEQ tablet TAKE 1 TABLET BY MOUTH TWO  TIMES DAILY 09/27/16   Richard Maceo Pro., MD  predniSONE (DELTASONE) 10 MG tablet Start with 6 tablets by mouth the first day and taper down by one tablet daily (6,5,4,3,2,1) 10/30/16   Vickki Muff Chrismon, PA  sertraline (ZOLOFT) 50 MG tablet Take 1 tablet by mouth  daily 03/27/16   Jerrol Banana., MD    Allergies Iodinated diagnostic agents; Iodine; Lisinopril; and Penicillins  Family History  Problem Relation Age of Onset  . Cancer Mother   . Hyperlipidemia Father   . Heart disease Father    . Vascular Disease Father   . Arthritis Sister   . Colon cancer Sister   . Migraines Brother   . Alcohol abuse Sister   . Dementia Sister   . Bipolar disorder Sister   . Heart attack Sister     around age 81  . Arthritis Sister   . Migraines Sister     Social History Social History  Substance Use Topics  . Smoking status: Former Smoker    Packs/day: 0.50    Years: 20.00    Quit date: 10/03/2003  . Smokeless tobacco: Never Used  . Alcohol use No    Review of Systems  Constitutional: Negative for fever. Cardiovascular: Negative for chest pain. Respiratory: Negative for shortness of breath. Gastrointestinal: Negative for abdominal pain, vomiting and diarrhea. Neurological: Positive for vision change, left UE numbness and speech change  10-point ROS otherwise negative.  ____________________________________________   PHYSICAL EXAM:  VITAL SIGNS: ED Triage Vitals  Enc Vitals Group     BP 11/07/16 1803 136/60     Pulse Rate 11/07/16 1801 63     Resp 11/07/16 1801 18     Temp 11/07/16 1801 98.1 F (36.7 C)     Temp Source 11/07/16 1801 Oral     SpO2 11/07/16 1801 99 %     Weight 11/07/16 1801 246 lb (111.6 kg)     Height 11/07/16 1801 5\' 3"  (1.6 m)    Constitutional: Alert and oriented. Well appearing and in no distress. Eyes: Conjunctivae are normal. Normal extraocular movements. ENT   Head: Normocephalic and atraumatic.   Nose: No congestion/rhinnorhea.   Mouth/Throat: Mucous membranes are moist.   Neck: No stridor. Hematological/Lymphatic/Immunilogical: No cervical lymphadenopathy. Cardiovascular: Normal rate, regular rhythm.  No murmurs, rubs, or gallops.  Respiratory: Normal respiratory effort without tachypnea nor retractions. Breath sounds are clear and equal bilaterally. No wheezes/rales/rhonchi. Gastrointestinal: Soft and non tender. No rebound. No guarding.  Genitourinary: Deferred Musculoskeletal: Normal range of motion in all  extremities. No lower extremity edema. Neurologic:  Normal speech and language. Face symmetric. Tongue midline. PERRL. EOMI. Strength 5/5 in upper and lower extremities. Finger to nose normal. Sensation intact.  Skin:  Skin is warm, dry and intact. No rash noted. Psychiatric: Mood and affect are normal. Speech and behavior are normal. Patient exhibits appropriate insight and judgment.  ____________________________________________    LABS (pertinent positives/negatives)  Labs Reviewed  CBC WITH DIFFERENTIAL/PLATELET - Abnormal; Notable for the following:       Result Value   RDW 14.9 (*)    All other components within normal limits  BASIC METABOLIC PANEL - Abnormal; Notable for the following:    Potassium 3.2 (*)    Chloride 96 (*)    CO2 33 (*)    Glucose, Bld 104 (*)    GFR calc non Af Amer 56 (*)    All other components within normal limits  TROPONIN I  ____________________________________________   EKG  Apolonio Schneiders, attending physician, personally viewed and interpreted this EKG  EKG Time: 1811 Rate: 69 Rhythm: atrial sensed ventricular paced rhythm Axis: left axis deviation Intervals: qtc 475 QRS: narrow ST changes: no st elevation Impression: abnormal ekg   ____________________________________________    RADIOLOGY  CT head  IMPRESSION: No acute abnormality noted.  ____________________________________________   PROCEDURES  Procedures  ____________________________________________   INITIAL IMPRESSION / ASSESSMENT AND PLAN / ED COURSE  Pertinent labs & imaging results that were available during my care of the patient were reviewed by me and considered in my medical decision making (see chart for details).  Patient presented to the emergency department today because of concerns for vision change and left upper extremity numbness and speech change. By the time of examination the symptoms had resolved. No focal neuro deficits on my exam. Do  have concerns for TIA. Head CT was negative. Will plan on admission to the hospital service for further workup and evaluation.  ____________________________________________   FINAL CLINICAL IMPRESSION(S) / ED DIAGNOSES  Final diagnoses:  Left sided numbness     Note: This dictation was prepared with Dragon dictation. Any transcriptional errors that result from this process are unintentional     Nance Pear, MD 11/07/16 2243

## 2016-11-07 NOTE — ED Triage Notes (Signed)
At 1530 pt started with numbness and tingling left hand with "tunnel vision". Was having slurred speech per daughter.  Called EMS and they told her that her BP was high.  Pt states "i don't feel right".  Numbness and speech improved.  Daughter reports pt still slow to respond.  Sx resolved at 1730

## 2016-11-08 ENCOUNTER — Observation Stay: Payer: Medicare Other

## 2016-11-08 ENCOUNTER — Observation Stay
Admit: 2016-11-08 | Discharge: 2016-11-08 | Disposition: A | Discharge status: Home / Self Care | Payer: Medicare Other | Attending: Family Medicine | Admitting: Family Medicine

## 2016-11-08 DIAGNOSIS — G459 Transient cerebral ischemic attack, unspecified: Secondary | ICD-10-CM

## 2016-11-08 DIAGNOSIS — I6523 Occlusion and stenosis of bilateral carotid arteries: Secondary | ICD-10-CM | POA: Diagnosis not present

## 2016-11-08 DIAGNOSIS — I10 Essential (primary) hypertension: Secondary | ICD-10-CM | POA: Diagnosis not present

## 2016-11-08 DIAGNOSIS — E785 Hyperlipidemia, unspecified: Secondary | ICD-10-CM | POA: Diagnosis not present

## 2016-11-08 LAB — URINE DRUG SCREEN, QUALITATIVE (ARMC ONLY)
Amphetamines, Ur Screen: NOT DETECTED
BARBITURATES, UR SCREEN: NOT DETECTED
Benzodiazepine, Ur Scrn: NOT DETECTED
COCAINE METABOLITE, UR ~~LOC~~: NOT DETECTED
Cannabinoid 50 Ng, Ur ~~LOC~~: NOT DETECTED
MDMA (Ecstasy)Ur Screen: NOT DETECTED
METHADONE SCREEN, URINE: NOT DETECTED
OPIATE, UR SCREEN: NOT DETECTED
Phencyclidine (PCP) Ur S: NOT DETECTED
Tricyclic, Ur Screen: NOT DETECTED

## 2016-11-08 LAB — LIPID PANEL
CHOL/HDL RATIO: 3.2 ratio
Cholesterol: 152 mg/dL (ref 0–200)
HDL: 47 mg/dL (ref >40)
LDL CALC: 86 mg/dL (ref 0–99)
Triglycerides: 94 mg/dL (ref <150)
VLDL: 19 mg/dL (ref 0–40)

## 2016-11-08 LAB — TROPONIN I
Troponin I: <0.03 ng/mL (ref <0.03)
Troponin I: <0.03 ng/mL (ref <0.03)
Troponin I: <0.03 ng/mL (ref <0.03)

## 2016-11-08 LAB — URINALYSIS, DIPSTICK ONLY
BILIRUBIN URINE: NEGATIVE
Glucose, UA: NEGATIVE mg/dL
HGB URINE DIPSTICK: NEGATIVE
KETONES UR: NEGATIVE mg/dL
Leukocytes, UA: NEGATIVE
Nitrite: NEGATIVE
PROTEIN: NEGATIVE mg/dL
Specific Gravity, Urine: 1.011 (ref 1.005–1.030)
pH: 6 (ref 5.0–8.0)

## 2016-11-08 LAB — CBC
HCT: 36 % (ref 35.0–47.0)
HEMOGLOBIN: 12.1 g/dL (ref 12.0–16.0)
MCH: 29.1 pg (ref 26.0–34.0)
MCHC: 33.6 g/dL (ref 32.0–36.0)
MCV: 86.6 fL (ref 80.0–100.0)
Platelets: 212 10*3/uL (ref 150–440)
RBC: 4.16 MIL/uL (ref 3.80–5.20)
RDW: 14.7 % — ABNORMAL HIGH (ref 11.5–14.5)
WBC: 12.7 10*3/uL — ABNORMAL HIGH (ref 3.6–11.0)

## 2016-11-08 LAB — CREATININE, SERUM
CREATININE: 1.03 mg/dL — AB (ref 0.44–1.00)
GFR calc Af Amer: >60 mL/min (ref >60)
GFR, EST NON AFRICAN AMERICAN: 54 mL/min — AB (ref >60)

## 2016-11-08 NOTE — Consult Note (Signed)
Referring Physician: Leslye Peer    Chief Complaint: Left sided numbness  HPI: Anna Marsh is an 70 y.o. female with a history of ocular migraines who presents with complaints of tunnel vision, slowed speech and left arm numbness/weakness.  Patient;s migraises usually consist of tunnel vision without the upper extremity or speech deficits. Patient presented for evaluation but symptoms resolved spontaneously.    Date last known well: Date: 11/07/2016 Time last known well: Time: 15:30 tPA Given: No: Resolution of symptoms  Past Medical History:  Diagnosis Date  . History of cardiac arrest   . Hypercholesteremia   . Hypertension     Past Surgical History:  Procedure Laterality Date  . ABDOMINAL HYSTERECTOMY    . HERNIA REPAIR     umbilical x 2  . NECK SURGERY    . PACEMAKER IMPLANT    . TONSILLECTOMY      Family History  Problem Relation Age of Onset  . Cancer Mother   . Hyperlipidemia Father   . Heart disease Father   . Vascular Disease Father   . Arthritis Sister   . Colon cancer Sister   . Migraines Brother   . Alcohol abuse Sister   . Dementia Sister   . Bipolar disorder Sister   . Heart attack Sister     around age 7  . Arthritis Sister   . Migraines Sister    Social History:  reports that she quit smoking about 13 years ago. She has a 10.00 pack-year smoking history. She has never used smokeless tobacco. She reports that she does not drink alcohol or use drugs.  Allergies:  Allergies  Allergen Reactions  . Iodinated Diagnostic Agents Other (See Comments)  . Iodine     Contrast Dye  . Lisinopril Cough  . Penicillins     Medications:  I have reviewed the patient's current medications. Prior to Admission:  Prescriptions Prior to Admission  Medication Sig Dispense Refill Last Dose  . aspirin 81 MG tablet Take by mouth.   Taking  . atorvastatin (LIPITOR) 80 MG tablet Take 1 tablet by mouth  daily 90 tablet 3 Taking  . cyanocobalamin 1000 MCG tablet Take  by mouth.   Taking  . ENTRESTO 24-26 MG    Taking  . furosemide (LASIX) 40 MG tablet Take 1 tablet (40 mg total) by mouth daily. 90 tablet 3 Taking  . hydrochlorothiazide (HYDRODIURIL) 25 MG tablet Take 1 tablet by mouth  daily 90 tablet 3 Taking  . loratadine (CLARITIN) 10 MG tablet Take by mouth.   Taking  . metoprolol succinate (TOPROL-XL) 25 MG 24 hr tablet Take 1 tablet (25 mg total) by mouth daily. 90 tablet 3 Taking  . MULTIPLE VITAMIN PO Take by mouth.   Taking  . nitroGLYCERIN (NITROSTAT) 0.4 MG SL tablet Place 1 tablet (0.4 mg total) under the tongue every 5 (five) minutes as needed for chest pain. 30 tablet 0   . omeprazole (PRILOSEC) 20 MG capsule Take 1 capsule by mouth  daily 90 capsule 3 Taking  . potassium chloride (K-DUR) 10 MEQ tablet Take 1 tablet by mouth two  times daily 180 tablet 3 Taking  . potassium chloride (K-DUR,KLOR-CON) 10 MEQ tablet TAKE 1 TABLET BY MOUTH TWO  TIMES DAILY 180 tablet 1   . predniSONE (DELTASONE) 10 MG tablet Start with 6 tablets by mouth the first day and taper down by one tablet daily (6,5,4,3,2,1) 21 tablet 0   . sertraline (ZOLOFT) 50 MG tablet Take 1 tablet  by mouth  daily 90 tablet 3 Taking   Scheduled: . aspirin  81 mg Oral Daily  . atorvastatin  80 mg Oral Daily  . enoxaparin (LOVENOX) injection  40 mg Subcutaneous BID  . loratadine  10 mg Oral Daily  . multivitamin with minerals  1 tablet Oral Daily  . pantoprazole  40 mg Oral Daily  . potassium chloride  10 mEq Oral BID  . sertraline  50 mg Oral Daily  . cyanocobalamin  1,000 mcg Oral Daily    ROS: History obtained from the patient  General ROS: negative for - chills, fatigue, fever, night sweats, weight gain or weight loss Psychological ROS: negative for - behavioral disorder, hallucinations, memory difficulties, mood swings or suicidal ideation Ophthalmic ROS: negative for - blurry vision, double vision, eye pain or loss of vision ENT ROS: negative for - epistaxis, nasal  discharge, oral lesions, sore throat, tinnitus or vertigo Allergy and Immunology ROS: negative for - hives or itchy/watery eyes Hematological and Lymphatic ROS: negative for - bleeding problems, bruising or swollen lymph nodes Endocrine ROS: negative for - galactorrhea, hair pattern changes, polydipsia/polyuria or temperature intolerance Respiratory ROS: negative for - cough, hemoptysis, shortness of breath or wheezing Cardiovascular ROS: negative for - chest pain, dyspnea on exertion, edema or irregular heartbeat Gastrointestinal ROS: negative for - abdominal pain, diarrhea, hematemesis, nausea/vomiting or stool incontinence Genito-Urinary ROS: negative for - dysuria, hematuria, incontinence or urinary frequency/urgency Musculoskeletal ROS: right shoulder pain Neurological ROS: as noted in HPI Dermatological ROS: negative for rash and skin lesion changes  Physical Examination: Blood pressure (!) 130/46, pulse 79, temperature 98.4 F (36.9 C), temperature source Oral, resp. rate 18, height 5\' 3"  (1.6 m), weight 113.9 kg (251 lb 3.2 oz), SpO2 97 %.  HEENT-  Normocephalic, no lesions, without obvious abnormality.  Normal external eye and conjunctiva.  Normal TM's bilaterally.  Normal auditory canals and external ears. Normal external nose, mucus membranes and septum.  Normal pharynx. Cardiovascular- S1, S2 normal, pulses palpable throughout   Lungs- chest clear, no wheezing, rales, normal symmetric air entry Abdomen- soft, non-tender; bowel sounds normal; no masses,  no organomegaly Extremities- no edema Lymph-no adenopathy palpable Musculoskeletal-no joint tenderness, deformity or swelling Skin-warm and dry, no hyperpigmentation, vitiligo, or suspicious lesions  Neurological Examination   Mental Status: Alert, oriented, thought content appropriate.  Speech fluent without evidence of aphasia.  Able to follow 3 step commands without difficulty. Cranial Nerves: II: Discs flat bilaterally;  Visual fields grossly normal, pupils equal, round, reactive to light and accommodation III,IV, VI: ptosis not present, extra-ocular motions intact bilaterally V,VII: smile symmetric, facial light touch sensation normal bilaterally VIII: hearing normal bilaterally IX,X: gag reflex present XI: bilateral shoulder shrug XII: midline tongue extension Motor: Right : Upper extremity   5/5    Left:     Upper extremity   5/5  Lower extremity   5/5     Lower extremity   5/5 Tone and bulk:normal tone throughout; no atrophy noted Sensory: Pinprick and light touch intact throughout, bilaterally Deep Tendon Reflexes: 2+ and symmetric with absent AJ's bilaterally Plantars: Right: downgoing   Left: downgoing Cerebellar: Normal finger-to-nose and normal heel-to-shin testing bilaterally Gait: not tested due to safety concerns    Laboratory Studies:  Basic Metabolic Panel:  Recent Labs Lab 11/07/16 1827 11/08/16 0019  NA 140  --   K 3.2*  --   CL 96*  --   CO2 33*  --   GLUCOSE 104*  --  BUN 19  --   CREATININE 0.99 1.03*  CALCIUM 9.3  --     Liver Function Tests: No results for input(s): AST, ALT, ALKPHOS, BILITOT, PROT, ALBUMIN in the last 168 hours. No results for input(s): LIPASE, AMYLASE in the last 168 hours. No results for input(s): AMMONIA in the last 168 hours.  CBC:  Recent Labs Lab 11/07/16 1827 11/08/16 0019  WBC 8.0 12.7*  NEUTROABS 4.9  --   HGB 13.1 12.1  HCT 39.3 36.0  MCV 85.4 86.6  PLT 257 212    Cardiac Enzymes:  Recent Labs Lab 11/07/16 1827 11/08/16 0019 11/08/16 0610 11/08/16 1133  TROPONINI <0.03 <0.03 <0.03 <0.03    BNP: Invalid input(s): POCBNP  CBG: No results for input(s): GLUCAP in the last 168 hours.  Microbiology: No results found for this or any previous visit.  Coagulation Studies: No results for input(s): LABPROT, INR in the last 72 hours.  Urinalysis:  Recent Labs Lab 11/07/16 2348  COLORURINE YELLOW*  LABSPEC 1.011   PHURINE 6.0  GLUCOSEU NEGATIVE  HGBUR NEGATIVE  BILIRUBINUR NEGATIVE  KETONESUR NEGATIVE  PROTEINUR NEGATIVE  NITRITE NEGATIVE  LEUKOCYTESUR NEGATIVE    Lipid Panel:    Component Value Date/Time   CHOL 152 11/08/2016 0610   CHOL 183 04/06/2016 0813   TRIG 94 11/08/2016 0610   HDL 47 11/08/2016 0610   HDL 52 04/06/2016 0813   CHOLHDL 3.2 11/08/2016 0610   VLDL 19 11/08/2016 0610   LDLCALC 86 11/08/2016 0610   LDLCALC 108 (H) 04/06/2016 0813    HgbA1C:  Lab Results  Component Value Date   HGBA1C 5.8 (H) 04/06/2016    Urine Drug Screen:     Component Value Date/Time   LABOPIA NONE DETECTED 11/07/2016 2348   COCAINSCRNUR NONE DETECTED 11/07/2016 2348   LABBENZ NONE DETECTED 11/07/2016 2348   AMPHETMU NONE DETECTED 11/07/2016 2348   THCU NONE DETECTED 11/07/2016 2348   LABBARB NONE DETECTED 11/07/2016 2348    Alcohol Level: No results for input(s): ETH in the last 168 hours.  Other results: EKG: paced rhythm at 69 bpm  Imaging: Ct Head Wo Contrast  Result Date: 11/07/2016 CLINICAL DATA:  Numbness and tingling in the left hand with slurred speech EXAM: CT HEAD WITHOUT CONTRAST TECHNIQUE: Contiguous axial images were obtained from the base of the skull through the vertex without intravenous contrast. COMPARISON:  None. FINDINGS: Brain: No evidence of acute infarction, hemorrhage, hydrocephalus, extra-axial collection or mass lesion/mass effect. Vascular: No hyperdense vessel or unexpected calcification. Skull: Normal. Negative for fracture or focal lesion. Sinuses/Orbits: No acute finding. Other: None. IMPRESSION: No acute abnormality noted. Electronically Signed   By: Inez Catalina M.D.   On: 11/07/2016 18:33    Assessment: 71 y.o. female presenting with speech disturbance, left arm weakness/numbness and tunnel vision.  Symptoms now resolved.  Head CT reviewed and shows no acute changes.  Can not rule out an acute ischemic event (TIA versus CVA) versus atypical migraine  presentation.  Patient on ASA at home.    Stroke Risk Factors - hyperlipidemia and hypertension  Plan: 1. HgbA1c pending 2. LDL 86.  Aggressive lipid management with target LDL<70. 3. PT consult, OT consult, Speech consult 4. Echocardiogram pending  5. Carotid dopplers pending 6. Prophylactic therapy-ASA 81mg  and Plavix 75mg  daily.   7. NPO until RN stroke swallow screen 8. Telemetry monitoring 9. Frequent neuro checks 10. MRI of the brain to be performed on an outpatient basis with further decisions about prophylaxis to  be made after this information is available.     Alexis Goodell, MD Neurology [phone] 11/08/2016, 2:30 PM

## 2016-11-08 NOTE — Evaluation (Signed)
Physical Therapy Evaluation Patient Details Name: Anna Marsh MRN: [id number] DOB: [birth year] Today's Date: 11/08/2016   History of Present Illness  Akala Laffitte is an 71 y.o. female with a history of ocular migraines who presents with complaints of tunnel vision, slowed speech and left arm numbness/weakness.  Patient;s migraises usually consist of tunnel vision without the upper extremity or speech deficits. Patient presented for evaluation but symptoms resolved spontaneously.  Head CT negative for acute changes.   Clinical Impression  Pt is independent with all mobility on this date. Symptoms precipitating admission have completely resolved and no focal weakness or sensory deficits identified. Bedside neurological exam WNL. Pt does have some higher level balance deficits particularly in single leg stance (1-2 seconds on each leg) which would benefit from OP PT to reduce her overall fall risk. This is not related to her current admission. Pt encouraged to obtain an order from her PCP if she would like to pursue balance therapy on an outpatient basis. Otherwise no PT needs identified. Will complete order. Please enter new order if status or needs change.    Follow Up Recommendations Outpatient PT;Other (comment) (For balance deficits, not related to current admission)    Equipment Recommendations  None recommended by PT    Recommendations for Other Services       Precautions / Restrictions Precautions Precautions: None Restrictions Weight Bearing Restrictions: No      Mobility  Bed Mobility Overal bed mobility: Independent             General bed mobility comments: Good speed/sequencing  Transfers Overall transfer level: Independent Equipment used: None             General transfer comment: Steady and stable with transfers. Safe hand placement. Good speed  Ambulation/Gait Ambulation/Gait assistance: Independent Ambulation Distance (Feet): 220  Feet Assistive device: None Gait Pattern/deviations: WFL(Within Functional Limits) Gait velocity: WFL Gait velocity interpretation: >2.62 ft/sec, indicative of independent community ambulator General Gait Details: Pt ambulates a full lap around RN station. Slight decrease in step length due to baseline balance deficits. Able to perform horizontal and vertical head turns as well as gait speed changes without lateral deviation  Stairs            Wheelchair Mobility    Modified Rankin (Stroke Patients Only)       Balance Overall balance assessment: Needs assistance Sitting-balance support: No upper extremity supported Sitting balance-Leahy Scale: Normal     Standing balance support: No upper extremity supported Standing balance-Leahy Scale: Good Standing balance comment: Normal wide and narrow stance balance. Negative Rhomberg. Single leg stance only approximately 2 seconds on each LE                             Pertinent Vitals/Pain Pain Assessment: No/denies pain    Home Living Family/patient expects to be discharged to:: Private residence Living Arrangements: Spouse/significant other;Other (Comment) (Serves as primary caregiver for husband) Available Help at Discharge: Family Type of Home: House Home Access: Stairs to enter Entrance Stairs-Rails: Right;Left;Can reach both Entrance Stairs-Number of Steps: 5 Home Layout: One level Home Equipment: None      Prior Function Level of Independence: Independent         Comments: Independent with ADLs/IADLs without assistive device. Serves as primary caregiver for husband who has medical concerns     Hand Dominance   Dominant Hand: Right    Extremity/Trunk Assessment   Upper Extremity  Assessment Upper Extremity Assessment: Overall WFL for tasks assessed;RUE deficits/detail RUE Deficits / Details: No focal weakness/numbness. Finger to nose normal. Negative pronator drift    Lower Extremity  Assessment Lower Extremity Assessment: Overall WFL for tasks assessed;RLE deficits/detail RLE Deficits / Details: No focal weakness/numbness. Heel to shin normal.       Communication   Communication: No difficulties  Cognition Arousal/Alertness: Awake/alert Behavior During Therapy: WFL for tasks assessed/performed Overall Cognitive Status: Within Functional Limits for tasks assessed                      General Comments      Exercises     Assessment/Plan    PT Assessment Patent does not need any further PT services  PT Problem List            PT Treatment Interventions      PT Goals (Current goals can be found in the Care Plan section)  Acute Rehab PT Goals PT Goal Formulation: All assessment and education complete, DC therapy    Frequency     Barriers to discharge        Co-evaluation               End of Session Equipment Utilized During Treatment: Gait belt Activity Tolerance: Patient tolerated treatment well Patient left: in bed;with call bell/phone within reach;with family/visitor present      Functional Assessment Tool Used: mob Functional Limitation: Mobility: Walking and moving around Mobility: Walking and Moving Around Current Status [id number]): At least 1 percent but less than 20 percent impaired, limited or restricted Mobility: Walking and Moving Around Goal Status [phone]): At least 1 percent but less than 20 percent impaired, limited or restricted Mobility: Walking and Moving Around Discharge Status [phone]): At least 1 percent but less than 20 percent impaired, limited or restricted    Time: 1325-1340 PT Time Calculation (min) (ACUTE ONLY): 15 min   Charges:   PT Evaluation $PT Eval Low Complexity: 1 Procedure     PT G Codes:   PT G-Codes **NOT FOR INPATIENT CLASS** Functional Assessment Tool Used: mob Functional Limitation: Mobility: Walking and moving around Mobility: Walking and Moving Around Current Status [id number]): At least  1 percent but less than 20 percent impaired, limited or restricted Mobility: Walking and Moving Around Goal Status [phone]): At least 1 percent but less than 20 percent impaired, limited or restricted Mobility: Walking and Moving Around Discharge Status [phone]): At least 1 percent but less than 20 percent impaired, limited or restricted   Phillips Grout PT, DPT   Mafalda Mcginniss 11/08/2016, 3:21 PM

## 2016-11-08 NOTE — Progress Notes (Addendum)
SLP Cancellation Note  Patient Details Name: Anna Marsh MRN: [id number] DOB: Feb 28, 1946   Cancelled treatment:       Reason Eval/Treat Not Completed: SLP screened, no needs identified, will sign off. Pt denied any trouble with speech/language or swallowing this date. Pt stated daughter called her yesterday and asked if everything was okay because she sounded "funny", but pt reports: "everything is back to normal today". Pt also denied any trouble with breakfast this morning, noting she ate the whole thing w/ no coughing/choking. Pt told to notify NSG if any further concerns arise. No further skilled ST services indicated this date. NSG to re-consult if any change in status during visit.    Eulogio Ditch, B.S Graduate Student  11/08/2016, 9:25 AM    Orinda Kenner, MS, CCC-SLP  Addendum: NSG reported no deficits in speech and swallowing as well.  Orinda Kenner, [place], CCC-SLP

## 2016-11-08 NOTE — Progress Notes (Signed)
Patient ID: Manyah Rappaport, female   DOB: 12-08-1945, 71 y.o.   MRN: [id number]  [facility] PROGRESS NOTE  Tamiqua Stup [id number] DOB: March 24, 1946 DOA: 11/07/2016 PCP: Wilhemena Durie, MD  HPI/Subjective: Patient admitted with tunnel vision both eyes and left arm numbness and slowed speech. Symptoms lasted 2 hours. Feels well now.  Objective: Vitals:   11/08/16 0959 11/08/16 1352  BP: (!) 128/41 (!) 130/46  Pulse: 64 79  Resp: 18   Temp: 98.4 F (36.9 C) 98.4 F (36.9 C)    Filed Weights   11/07/16 1801 11/07/16 2351  Weight: 111.6 kg (246 lb) 113.9 kg (251 lb 3.2 oz)    ROS: Review of Systems  Constitutional: Negative for chills and fever.  Eyes: Negative for blurred vision.  Respiratory: Negative for cough and shortness of breath.   Cardiovascular: Negative for chest pain.  Gastrointestinal: Negative for abdominal pain, constipation, diarrhea, nausea and vomiting.  Genitourinary: Negative for dysuria.  Musculoskeletal: Negative for joint pain.  Neurological: Negative for dizziness and headaches.   Exam: Physical Exam  Constitutional: She is oriented to person, place, and time.  HENT:  Nose: No mucosal edema.  Mouth/Throat: No oropharyngeal exudate or posterior oropharyngeal edema.  Eyes: Conjunctivae, EOM and lids are normal. Pupils are equal, round, and reactive to light.  Neck: No JVD present. Carotid bruit is not present. No edema present. No thyroid mass and no thyromegaly present.  Cardiovascular: S1 normal and S2 normal.  Exam reveals no gallop.   No murmur heard. Pulses:      Dorsalis pedis pulses are 2+ on the right side, and 2+ on the left side.  Respiratory: No respiratory distress. She has no wheezes. She has no rhonchi. She has no rales.  GI: Soft. Bowel sounds are normal. There is no tenderness.  Musculoskeletal:       Right shoulder: She exhibits no swelling.  Lymphadenopathy:    She has no cervical adenopathy.  Neurological: She  is alert and oriented to person, place, and time. No cranial nerve deficit.  Power 5/5 bilaterally, babinski negative  Skin: Skin is warm. No rash noted. Nails show no clubbing.  Psychiatric: She has a normal mood and affect.      Data Reviewed: Basic Metabolic Panel:  Recent Labs Lab 11/07/16 1827 11/08/16 0019  NA 140  --   K 3.2*  --   CL 96*  --   CO2 33*  --   GLUCOSE 104*  --   BUN 19  --   CREATININE 0.99 1.03*  CALCIUM 9.3  --    CBC:  Recent Labs Lab 11/07/16 1827 11/08/16 0019  WBC 8.0 12.7*  NEUTROABS 4.9  --   HGB 13.1 12.1  HCT 39.3 36.0  MCV 85.4 86.6  PLT 257 212   Cardiac Enzymes:  Recent Labs Lab 11/07/16 1827 11/08/16 0019 11/08/16 0610 11/08/16 1133  TROPONINI <0.03 <0.03 <0.03 <0.03      Studies: Ct Head Wo Contrast  Result Date: 11/07/2016 CLINICAL DATA:  Numbness and tingling in the left hand with slurred speech EXAM: CT HEAD WITHOUT CONTRAST TECHNIQUE: Contiguous axial images were obtained from the base of the skull through the vertex without intravenous contrast. COMPARISON:  None. FINDINGS: Brain: No evidence of acute infarction, hemorrhage, hydrocephalus, extra-axial collection or mass lesion/mass effect. Vascular: No hyperdense vessel or unexpected calcification. Skull: Normal. Negative for fracture or focal lesion. Sinuses/Orbits: No acute finding. Other: None. IMPRESSION: No acute abnormality noted. Electronically Signed  By: Inez Catalina M.D.   On: 11/07/2016 18:33    Scheduled Meds: . aspirin  81 mg Oral Daily  . atorvastatin  80 mg Oral Daily  . enoxaparin (LOVENOX) injection  40 mg Subcutaneous BID  . loratadine  10 mg Oral Daily  . multivitamin with minerals  1 tablet Oral Daily  . pantoprazole  40 mg Oral Daily  . potassium chloride  10 mEq Oral BID  . sertraline  50 mg Oral Daily  . cyanocobalamin  1,000 mcg Oral Daily   Continuous Infusions: . sodium chloride 75 mL/hr at 11/08/16 0035     Assessment/Plan:  1. TIA versus migraine without headache with neurological symptoms.  [employer]. Neuro consult. Echo and carotid ultrasound pending 2. Hypokalemia- replace orally, check mg 3. Hypertension. Blood pressure currently controlled 4. Hyperlipidemia unspeicified on lipitor 5. Depression on zoloft  Code Status:     Code Status Orders        Start     Ordered   11/07/16 2348  Full code  Continuous     11/07/16 2347    Code Status History    Date Active Date Inactive Code Status Order ID Comments User Context   This patient has a current code status but no historical code status.     Family Communication: daughter at bedside Disposition Plan: home tomorrow am after echo and carotid ultrasound done  Consultants:  neurology  Time spent: 81minutes  Loletha Grayer  [employer]

## 2016-11-08 NOTE — Care Management (Signed)
Patient presents from home with sx concerning for cva/tia. Placed in observation  Neuro, and PT consults pending. prior to this presentation, patient independent in adls.  Current with PCP

## 2016-11-08 NOTE — Care Management Obs Status (Signed)
[facility] NOTIFICATION   Patient Details  Name: [name] MRN: [id number] Date of Birth: [birth year]   Medicare Observation Status Notification Given:  Yes    Marshell Garfinkel, RN 11/08/2016, 9:32 AM

## 2016-11-08 NOTE — Progress Notes (Signed)
OT Cancellation Note  Patient Details Name: Anna Marsh MRN: [id number] DOB: 1946/03/05   Cancelled Treatment:    Reason Eval/Treat Not Completed: OT screened, no needs identified, will sign off.  Order received and chart reviewed.  Met with patient and her daughter [name] and pt is back to baseline for ADLs and no needs identified to complete full evaluation, only screening.  Signing off.  Thank you for the referral.  Chrys Racer, OTR/L ascom [phone] 11/08/16, 12:04 PM

## 2016-11-09 DIAGNOSIS — G459 Transient cerebral ischemic attack, unspecified: Secondary | ICD-10-CM | POA: Diagnosis not present

## 2016-11-09 DIAGNOSIS — E785 Hyperlipidemia, unspecified: Secondary | ICD-10-CM | POA: Diagnosis not present

## 2016-11-09 DIAGNOSIS — I10 Essential (primary) hypertension: Secondary | ICD-10-CM | POA: Diagnosis not present

## 2016-11-09 LAB — ECHOCARDIOGRAM COMPLETE
Height: 63 in
WEIGHTICAEL: 4019.2 [oz_av]

## 2016-11-09 LAB — TROPONIN I

## 2016-11-09 LAB — HEMOGLOBIN A1C
HEMOGLOBIN A1C: 5.8 % — AB (ref 4.8–5.6)
Mean Plasma Glucose: 120 mg/dL

## 2016-11-09 MED ORDER — CLOPIDOGREL BISULFATE 75 MG PO TABS
75.0000 mg | ORAL_TABLET | Freq: Every day | ORAL | Status: DC
  Administered 2016-11-09: 75 mg via ORAL
  Filled 2016-11-09: qty 1

## 2016-11-09 MED ORDER — CLOPIDOGREL BISULFATE 75 MG PO TABS: 75.0000 mg | ORAL_TABLET | Freq: Every day | ORAL | 0 refills | Status: DC

## 2016-11-09 NOTE — Discharge Instructions (Signed)
Can consider MRA carotids and MRI brain as outpatient (defibrillator will need to be turned off with procedure)

## 2016-11-09 NOTE — Discharge Summary (Signed)
[facility] NAME: Anna Marsh    MR#:  [id number]  DATE OF BIRTH:  September 02, 1946  DATE OF ADMISSION:  11/07/2016 ADMITTING PHYSICIAN: Harvie Bridge, DO  DATE OF DISCHARGE: 11/09/2016 11:20 AM  PRIMARY CARE PHYSICIAN: Wilhemena Durie, MD    ADMISSION DIAGNOSIS:  Left sided numbness [R20.0]  DISCHARGE DIAGNOSIS:  Active Problems:   TIA (transient ischemic attack)   SECONDARY DIAGNOSIS:   Past Medical History:  Diagnosis Date  . History of cardiac arrest   . Hypercholesteremia   . Hypertension     HOSPITAL COURSE:   1. Transient ischemic attack. Case discussed with neurology. Patient already on aspirin and atorvastatin. Plavix is added. Can consider MRI of the brain and MRA of the carotids as outpatient. Since the patient does have a defibrillator, this will have to be done on the previous place that she had her MRI of the cervical spine done. 2. Hypokalemia. Replaced orally. Stop hydrochlorothiazide 3. Essential hypertension. Blood pressure currently controlled. Initially blood pressure medications were held. Can go back on usual medications as outpatient. 4. Hyperlipidemia unspecified on Lipitor. LDL 86 5. History of chronic systolic congestive heart failure. No signs of heart failure on this hospital course. Can restart entresto, beta blocker as outpatient. 6. Carotid stenosis seen on ultrasound. Patient has a dye allergy so I will not do a CT angiogram. Can consider an MRA as outpatient. 7. This could also be a migraine variant where she gets her total vision but also had other neurological symptoms. Can consider neurological evaluation as outpatient.  DISCHARGE CONDITIONS:   Satisfactory  CONSULTS OBTAINED:  Treatment Team:  Alexis Goodell, MD  DRUG ALLERGIES:   Allergies  Allergen Reactions  . Iodinated Diagnostic Agents Other (See Comments)  . Iodine     Contrast Dye  . Lisinopril Cough  . Penicillins      DISCHARGE MEDICATIONS:   Discharge Medication List as of 11/09/2016 11:03 AM    START taking these medications   Details  clopidogrel (PLAVIX) 75 MG tablet Take 1 tablet (75 mg total) by mouth daily., Starting Thu 11/09/2016, Print      CONTINUE these medications which have NOT CHANGED   Details  aspirin 81 MG tablet Take by mouth., Until Discontinued, Historical Med    atorvastatin (LIPITOR) 80 MG tablet Take 1 tablet by mouth  daily, Normal    cyanocobalamin 1000 MCG tablet Take by mouth., Until Discontinued, Historical Med    ENTRESTO 24-26 MG Starting 09/13/2015, Until Discontinued, Historical Med    furosemide (LASIX) 40 MG tablet Take 1 tablet (40 mg total) by mouth daily., Starting Fri 06/09/2016, Normal    loratadine (CLARITIN) 10 MG tablet Take by mouth., Until Discontinued, Historical Med    metoprolol succinate (TOPROL-XL) 25 MG 24 hr tablet Take 1 tablet (25 mg total) by mouth daily., Starting Mon 03/27/2016, Normal    MULTIPLE VITAMIN PO Take by mouth., Until Discontinued, Historical Med    nitroGLYCERIN (NITROSTAT) 0.4 MG SL tablet Place 1 tablet (0.4 mg total) under the tongue every 5 (five) minutes as needed for chest pain., Starting Fri 08/18/2016, Until Sat 08/18/2017, Normal    omeprazole (PRILOSEC) 20 MG capsule Take 1 capsule by mouth  daily, Normal    potassium chloride (K-DUR) 10 MEQ tablet Take 1 tablet by mouth two  times daily, Normal    sertraline (ZOLOFT) 50 MG tablet Take 1 tablet by mouth  daily, Normal  STOP taking these medications     hydrochlorothiazide (HYDRODIURIL) 25 MG tablet      potassium chloride (K-DUR,KLOR-CON) 10 MEQ tablet      predniSONE (DELTASONE) 10 MG tablet          DISCHARGE INSTRUCTIONS:   Follow-up PMD one week  If you experience worsening of your admission symptoms, develop shortness of breath, life threatening emergency, suicidal or homicidal thoughts you must seek medical attention immediately by calling  911 or calling your MD immediately  if symptoms less severe.  You Must read complete instructions/literature along with all the possible adverse reactions/side effects for all the Medicines you take and that have been prescribed to you. Take any new Medicines after you have completely understood and accept all the possible adverse reactions/side effects.   Please note  You were cared for by a hospitalist during your hospital stay. If you have any questions about your discharge medications or the care you received while you were in the hospital after you are discharged, you can call the unit and asked to speak with the hospitalist on call if the hospitalist that took care of you is not available. Once you are discharged, your primary care physician will handle any further medical issues. Please note that NO REFILLS for any discharge medications will be authorized once you are discharged, as it is imperative that you return to your primary care physician (or establish a relationship with a primary care physician if you do not have one) for your aftercare needs so that they can reassess your need for medications and monitor your lab values.    Today   CHIEF COMPLAINT:   Chief Complaint  Patient presents with  . Numbness    HISTORY OF PRESENT ILLNESS:  Anna Marsh  is a 71 y.o. female presented with numbness of the left arm, tunnel vision both eyes and slowed speech   VITAL SIGNS:  Blood pressure (!) 138/53, pulse 73, temperature 98.2 F (36.8 C), temperature source Oral, resp. rate 18, height 5\' 3"  (1.6 m), weight 113.9 kg (251 lb 3.2 oz), SpO2 97 %.   PHYSICAL EXAMINATION:  GENERAL:  71 y.o.-year-old patient lying in the bed with no acute distress.  EYES: Pupils equal, round, reactive to light and accommodation. No scleral icterus. Extraocular muscles intact.  HEENT: Head atraumatic, normocephalic. Oropharynx and nasopharynx clear.  NECK:  Supple, no jugular venous distention. No thyroid  enlargement, no tenderness.  LUNGS: Normal breath sounds bilaterally, no wheezing, rales,rhonchi or crepitation. No use of accessory muscles of respiration.  CARDIOVASCULAR: S1, S2 normal. No murmurs, rubs, or gallops.  ABDOMEN: Soft, non-tender, non-distended. Bowel sounds present. No organomegaly or mass.  EXTREMITIES: No pedal edema, cyanosis, or clubbing.  NEUROLOGIC: Cranial nerves II through XII are intact. Muscle strength 5/5 in all extremities. Sensation intact. Gait not checked.  PSYCHIATRIC: The patient is alert and oriented x 3.  SKIN: No obvious rash, lesion, or ulcer.   DATA REVIEW:   CBC  Recent Labs Lab 11/08/16 0019  WBC 12.7*  HGB 12.1  HCT 36.0  PLT 212    Chemistries   Recent Labs Lab 11/07/16 1827 11/08/16 0019  NA 140  --   K 3.2*  --   CL 96*  --   CO2 33*  --   GLUCOSE 104*  --   BUN 19  --   CREATININE 0.99 1.03*  CALCIUM 9.3  --     Cardiac Enzymes  Recent Labs Lab 11/09/16 0556  TROPONINI <0.03      RADIOLOGY:  Ct Head Wo Contrast  Result Date: 11/07/2016 CLINICAL DATA:  Numbness and tingling in the left hand with slurred speech EXAM: CT HEAD WITHOUT CONTRAST TECHNIQUE: Contiguous axial images were obtained from the base of the skull through the vertex without intravenous contrast. COMPARISON:  None. FINDINGS: Brain: No evidence of acute infarction, hemorrhage, hydrocephalus, extra-axial collection or mass lesion/mass effect. Vascular: No hyperdense vessel or unexpected calcification. Skull: Normal. Negative for fracture or focal lesion. Sinuses/Orbits: No acute finding. Other: None. IMPRESSION: No acute abnormality noted. Electronically Signed   By: Inez Catalina M.D.   On: 11/07/2016 18:33   US Carotid Bilateral (at [facility] Only)  Result Date: 11/08/2016 CLINICAL DATA:  71 year old female with history of TIA. Cardiovascular risk factors include hypertension, known prior TIA/ stroke, hyperlipidemia EXAM: BILATERAL CAROTID DUPLEX  ULTRASOUND TECHNIQUE: Pearline Cables scale imaging, color Doppler and duplex ultrasound were performed of bilateral carotid and vertebral arteries in the neck. COMPARISON:  None FINDINGS: Criteria: Quantification of carotid stenosis is based on velocity parameters that correlate the residual internal carotid diameter with NASCET-based stenosis levels, using the diameter of the distal internal carotid lumen as the denominator for stenosis measurement. The following velocity measurements were obtained: RIGHT ICA:  Systolic XX123456 cm/sec, Diastolic 38 cm/sec CCA:  82 cm/sec SYSTOLIC ICA/CCA RATIO:  2.4 ECA:  259 cm/sec LEFT ICA:  Systolic 123456 cm/sec, Diastolic 32 cm/sec CCA:  [id number] cm/sec SYSTOLIC ICA/CCA RATIO:  1.4 ECA:  259 cm/sec Right Brachial SBP: Not acquired Left Brachial SBP: Not acquired RIGHT CAROTID ARTERY: No significant calcifications of the right common carotid artery. Intermediate waveform maintained. Heterogeneous and partially calcified plaque at the right carotid bifurcation. Luminal shadowing secondary to near field plaque. Low resistance waveform of the right ICA. No significant tortuosity. RIGHT VERTEBRAL ARTERY: Antegrade flow with low resistance waveform. LEFT CAROTID ARTERY: Atherosclerotic changes of the left common carotid artery. Intermediate waveform maintained. Heterogeneous and partially calcified plaque at the left carotid bifurcation with luminal shadowing. Low resistance waveform of the left ICA. No significant tortuosity. LEFT VERTEBRAL ARTERY:  Antegrade flow with low resistance waveform. IMPRESSION: Right: Heterogeneous and partially calcified plaque at the carotid bifurcation contributes to 50% - 69% stenosis by established duplex criteria. Note that flow velocity of the right ICA were obtained from an area distal to the maximum narrowing due to the presence of anterior wall plaque with shadowing and may be underestimating the percentage of ICA stenosis. If a more precise assessment of carotid  stenosis is required, consider a formal cerebral angiogram, or alternatively CTA. Left: Heterogeneous plaque at the carotid bifurcation, with discordant results regarding degree of stenosis by established duplex criteria. Peak velocity suggests 50% - 69% stenosis, with the ICA/ CCA ratio suggesting a lesser degree of stenosis. If establishing a more accurate degree of stenosis is required, cerebral angiogram should be considered, or as a second best test, CTA. Signed, Dulcy Fanny. Earleen Newport, DO [facility] Electronically Signed   By: Corrie Mckusick D.O.   On: 11/08/2016 17:40    Management plans discussed with the patient, family and they are in agreement.  CODE STATUS:  Code Status History    Date Active Date Inactive Code Status Order ID Comments User Context   11/07/2016 11:47 PM 11/09/2016  2:28 PM Full Code [id number]  Harvie Bridge, DO Inpatient      TOTAL TIME TAKING CARE OF THIS PATIENT: 35 minutes.    Leslye Peer,  Khale Nigh M.D on 11/09/2016 at 6:21 PM  Between 7am to 6pm - Pager - [phone]  After 6pm go to [email or web address] - password [email or web address]  [facility] Office  [phone]  CC: Primary care physician; Wilhemena Durie, MD

## 2016-11-09 NOTE — Care Management (Signed)
Discharge to home today per Dr. Leslye Peer. Physical therapy evaluation completed. Recommending outpatient therapy for balance. [name] states that the therapist has shown her what she needs to do to correct her balance issues. Declining outpatient services at this time. Family will transport. Anna Ammons RN MAN CCM Care Management

## 2016-11-10 ENCOUNTER — Telehealth: Payer: Self-pay

## 2016-11-14 DIAGNOSIS — Z95810 Presence of automatic (implantable) cardiac defibrillator: Secondary | ICD-10-CM | POA: Diagnosis not present

## 2016-11-14 DIAGNOSIS — Z45018 Encounter for adjustment and management of other part of cardiac pacemaker: Secondary | ICD-10-CM | POA: Diagnosis not present

## 2016-11-14 DIAGNOSIS — R000 Tachycardia, unspecified: Secondary | ICD-10-CM | POA: Diagnosis not present

## 2016-11-15 ENCOUNTER — Ambulatory Visit (INDEPENDENT_AMBULATORY_CARE_PROVIDER_SITE_OTHER): Payer: Medicare Other | Admitting: Family Medicine

## 2016-11-15 ENCOUNTER — Encounter: Payer: Self-pay | Admitting: Family Medicine

## 2016-11-15 VITALS — BP 110/62 | HR 76 | Temp 97.5°F | Resp 16 | Wt 252.0 lb

## 2016-11-15 DIAGNOSIS — Z09 Encounter for follow-up examination after completed treatment for conditions other than malignant neoplasm: Secondary | ICD-10-CM

## 2016-11-15 DIAGNOSIS — G459 Transient cerebral ischemic attack, unspecified: Secondary | ICD-10-CM | POA: Diagnosis not present

## 2016-11-15 DIAGNOSIS — I10 Essential (primary) hypertension: Secondary | ICD-10-CM | POA: Diagnosis not present

## 2016-11-15 DIAGNOSIS — H2513 Age-related nuclear cataract, bilateral: Secondary | ICD-10-CM | POA: Diagnosis not present

## 2016-11-15 NOTE — Telephone Encounter (Signed)
No returned calls from pt. Pt has appointment today for hospital f/u. Unable to complete TCM call.  FYI - MM

## 2016-11-15 NOTE — Patient Instructions (Signed)
Get a blood pressure cuff for home monitoring. Welch Allyn and [employer] blood pressure cuffs are best.

## 2016-11-15 NOTE — Progress Notes (Signed)
Subjective:  HPI Hospital stay 11/07/16- 11/09/16 Transition care visit. Diagnoses- TIA  Started Plavix 75 mg daily.   Discontinue HCTZ 25 mg, Potassium 10 meq, and prednisone 10 mg  Pt reports that she called Dr. Ubaldo Glassing about the HCTZ and the potassium and he wanted her to start it back. She is taking Plavix. She reports that all her TIA symptoms have resolved. Pt reports that other than family stress and husbands and daughters health she is doing well. Pt reports that they told her needed an MRI but needed to be out patient and at [facility] due to her defibrillator.   Prior to Admission medications   Medication Sig Start Date End Date Taking? Authorizing Provider  aspirin 81 MG tablet Take by mouth.    Historical Provider, MD  atorvastatin (LIPITOR) 80 MG tablet Take 1 tablet by mouth  daily 06/07/16   Jerrol Banana., MD  clopidogrel (PLAVIX) 75 MG tablet Take 1 tablet (75 mg total) by mouth daily. 11/09/16   Loletha Grayer, MD  cyanocobalamin 1000 MCG tablet Take by mouth.    Historical Provider, MD  ENTRESTO 24-26 MG  09/13/15   Historical Provider, MD  furosemide (LASIX) 40 MG tablet Take 1 tablet (40 mg total) by mouth daily. 06/09/16   Mar Daring, PA-C  loratadine (CLARITIN) 10 MG tablet Take by mouth.    Historical Provider, MD  metoprolol succinate (TOPROL-XL) 25 MG 24 hr tablet Take 1 tablet (25 mg total) by mouth daily. 03/27/16   Richard Maceo Pro., MD  MULTIPLE VITAMIN PO Take by mouth.    Historical Provider, MD  nitroGLYCERIN (NITROSTAT) 0.4 MG SL tablet Place 1 tablet (0.4 mg total) under the tongue every 5 (five) minutes as needed for chest pain. 08/18/16 08/18/17  Vickki Muff Chrismon, PA  omeprazole (PRILOSEC) 20 MG capsule Take 1 capsule by mouth  daily 03/27/16   Jerrol Banana., MD  potassium chloride (K-DUR) 10 MEQ tablet Take 1 tablet by mouth two  times daily 09/09/15   Jerrol Banana., MD  sertraline (ZOLOFT) 50 MG tablet Take 1 tablet by mouth  daily  03/27/16   Jerrol Banana., MD    Patient Active Problem List   Diagnosis Date Noted  . TIA (transient ischemic attack) 11/07/2016  . Pre-diabetes 07/20/2015  . Acute anxiety 07/20/2015  . Cardiomyopathy ([place]) 02/28/2015  . 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 GERD (gastroesophageal reflux disease) 02/28/2015  . Hypercholesterolemia 02/28/2015  . Hypertension 02/28/2015  . Cardiac defibrillator in place 02/28/2015  . Block, bundle branch, left 02/28/2015  . Adiposity 02/28/2015  . Acid reflux 02/28/2015  . 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 Chronic systolic heart failure ([place]) 02/24/2015  . H/O cardiac catheterization 02/24/2015  . 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 Diabetes mellitus, type 2 ([place]) 01/29/2015  . Angina pectoris ([place]) 12/25/2013  . Arthritis 12/25/2013  . 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 History of cardiac arrest   . Hypercholesteremia   . Hypertension     Social History   Social History  . Marital status: Married    Spouse name: N/A  . Number of children: N/A  . Years of education: N/A   Occupational History  . Not on file.   Social History Main Topics  . Smoking status: Former Smoker    Packs/day: 0.50    Years: 20.00  Quit date: 10/03/2003  . Smokeless tobacco: Never Used  . Alcohol use No  . Drug use: No  . Sexual activity: No   Other Topics Concern  . Not on file   Social History Narrative  . No narrative on file    Allergies  Allergen Reactions  . Iodinated Diagnostic Agents Other (See Comments)  . Iodine     Contrast Dye  . Lisinopril Cough  . Penicillins     Review of Systems  Constitutional: Negative.   HENT: Negative.   Eyes: Negative.   Respiratory: Negative.   Cardiovascular: Negative.   Gastrointestinal: Negative.   Genitourinary: Negative.   Musculoskeletal: Negative.   Skin: Negative.   Neurological:  Negative.   Endo/Heme/Allergies: Negative.   Psychiatric/Behavioral: Negative.     Immunization History  Administered Date(s) Administered  . Influenza, High Dose Seasonal PF 06/24/2015, 07/24/2016  . Pneumococcal Conjugate-13 08/29/2016    Objective:  There were no vitals taken for this visit. VS at top of note. Physical Exam  Constitutional: She is oriented to person, place, and time and well-developed, well-nourished, and in no distress.  Eyes: Conjunctivae and EOM are normal. Pupils are equal, round, and reactive to light.  Neck: Normal range of motion. Neck supple.  Cardiovascular: Normal rate, regular rhythm, normal heart sounds and intact distal pulses.   Pulmonary/Chest: Effort normal and breath sounds normal.  Musculoskeletal: Normal range of motion.  Neurological: She is alert and oriented to person, place, and time. She has normal reflexes. Gait normal. GCS score is 15.  Skin: Skin is warm and dry.  Psychiatric: Mood, memory, affect and judgment normal.    Lab Results  Component Value Date   WBC 12.7 (H) 11/08/2016   HGB 12.1 11/08/2016   HCT 36.0 11/08/2016   PLT 212 11/08/2016   GLUCOSE 104 (H) 11/07/2016   CHOL 152 11/08/2016   TRIG 94 11/08/2016   HDL 47 11/08/2016   LDLCALC 86 11/08/2016   TSH 2.990 04/06/2016   HGBA1C 5.8 (H) 11/08/2016    CMP     Component Value Date/Time   NA 140 11/07/2016 1827   NA 146 (H) 04/06/2016 0813   NA 140 09/29/2014 1128   K 3.2 (L) 11/07/2016 1827   K 3.6 09/29/2014 1128   CL 96 (L) 11/07/2016 1827   CL 103 09/29/2014 1128   CO2 33 (H) 11/07/2016 1827   CO2 32 09/29/2014 1128   GLUCOSE 104 (H) 11/07/2016 1827   GLUCOSE 84 09/29/2014 1128   BUN 19 11/07/2016 1827   BUN 16 04/06/2016 0813   BUN 16 09/29/2014 1128   CREATININE 1.03 (H) 11/08/2016 0019   CREATININE 1.10 09/29/2014 1128   CALCIUM 9.3 11/07/2016 1827   CALCIUM 9.2 09/29/2014 1128   PROT 6.3 04/06/2016 0813   ALBUMIN 3.8 04/06/2016 0813   AST 19  04/06/2016 0813   ALT 13 04/06/2016 0813   ALKPHOS 121 (H) 04/06/2016 0813   BILITOT 0.4 04/06/2016 0813   GFRNONAA 54 (L) 11/08/2016 0019   GFRNONAA 52 (L) 09/29/2014 1128   GFRAA >60 11/08/2016 0019   GFRAA >60 09/29/2014 1128    Assessment and Plan :  1. Hospital discharge follow-up   2. Transient cerebral ischemia, unspecified type Left arm numbness resolved. MRI at [facility] pending. Part of episode might have been orthostasis. Encouraged fluids.  3. Essential hypertension Pt may be having episodes of low blood pressure with tunnel vision. Pt to get blood pressure cuff at home and monitor blood  pressure and increase fluids.    HPI, Exam, and A&P Transcribed under the direction and in the presence of Richard L. Cranford Mon, MD  Electronically Signed: Katina Dung, CMA I have done the exam and reviewed the above chart and it is accurate to the best of my knowledge. Development worker, community has been used in this note in any air is in the dictation or transcription are unintentional.  [facility] 11/15/2016 11:13 AM

## 2016-11-27 DIAGNOSIS — M9981 Other biomechanical lesions of cervical region: Secondary | ICD-10-CM | POA: Diagnosis not present

## 2016-11-28 DIAGNOSIS — Z7902 Long term (current) use of antithrombotics/antiplatelets: Secondary | ICD-10-CM | POA: Diagnosis not present

## 2016-11-28 DIAGNOSIS — Z79899 Other long term (current) drug therapy: Secondary | ICD-10-CM | POA: Diagnosis not present

## 2016-11-28 DIAGNOSIS — I509 Heart failure, unspecified: Secondary | ICD-10-CM | POA: Diagnosis not present

## 2016-11-28 DIAGNOSIS — Z880 Allergy status to penicillin: Secondary | ICD-10-CM | POA: Diagnosis not present

## 2016-11-28 DIAGNOSIS — E785 Hyperlipidemia, unspecified: Secondary | ICD-10-CM | POA: Diagnosis not present

## 2016-11-28 DIAGNOSIS — E119 Type 2 diabetes mellitus without complications: Secondary | ICD-10-CM | POA: Diagnosis not present

## 2016-11-28 DIAGNOSIS — Z4502 Encounter for adjustment and management of automatic implantable cardiac defibrillator: Secondary | ICD-10-CM | POA: Diagnosis not present

## 2016-11-28 DIAGNOSIS — I447 Left bundle-branch block, unspecified: Secondary | ICD-10-CM | POA: Diagnosis not present

## 2016-11-28 DIAGNOSIS — Z7982 Long term (current) use of aspirin: Secondary | ICD-10-CM | POA: Diagnosis not present

## 2016-11-28 DIAGNOSIS — I110 Hypertensive heart disease with heart failure: Secondary | ICD-10-CM | POA: Diagnosis not present

## 2016-11-28 DIAGNOSIS — Z888 Allergy status to other drugs, medicaments and biological substances status: Secondary | ICD-10-CM | POA: Diagnosis not present

## 2016-11-28 DIAGNOSIS — I499 Cardiac arrhythmia, unspecified: Secondary | ICD-10-CM | POA: Diagnosis not present

## 2016-11-29 ENCOUNTER — Other Ambulatory Visit: Payer: Self-pay | Admitting: Family Medicine

## 2016-11-29 DIAGNOSIS — Z1231 Encounter for screening mammogram for malignant neoplasm of breast: Secondary | ICD-10-CM

## 2016-12-05 ENCOUNTER — Ambulatory Visit
Admission: RE | Admit: 2016-12-05 | Discharge: 2016-12-05 | Disposition: A | Discharge status: Home / Self Care | Payer: Medicare Other | Source: Ambulatory Visit | Attending: Family Medicine | Admitting: Family Medicine

## 2016-12-05 ENCOUNTER — Telehealth: Payer: Self-pay

## 2016-12-05 ENCOUNTER — Ambulatory Visit (INDEPENDENT_AMBULATORY_CARE_PROVIDER_SITE_OTHER): Payer: Medicare Other | Admitting: Family Medicine

## 2016-12-05 VITALS — BP 104/58 | HR 82 | Temp 98.1°F | Resp 14 | Wt 250.0 lb

## 2016-12-05 DIAGNOSIS — R05 Cough: Secondary | ICD-10-CM

## 2016-12-05 DIAGNOSIS — I700 Atherosclerosis of aorta: Secondary | ICD-10-CM | POA: Insufficient documentation

## 2016-12-05 DIAGNOSIS — I6523 Occlusion and stenosis of bilateral carotid arteries: Secondary | ICD-10-CM | POA: Diagnosis not present

## 2016-12-05 DIAGNOSIS — J00 Acute nasopharyngitis [common cold]: Secondary | ICD-10-CM

## 2016-12-05 DIAGNOSIS — L299 Pruritus, unspecified: Secondary | ICD-10-CM | POA: Diagnosis not present

## 2016-12-05 DIAGNOSIS — R059 Cough, unspecified: Secondary | ICD-10-CM

## 2016-12-05 DIAGNOSIS — I209 Angina pectoris, unspecified: Secondary | ICD-10-CM

## 2016-12-05 MED ORDER — NITROGLYCERIN 0.4 MG SL SUBL: 0.4000 mg | SUBLINGUAL_TABLET | SUBLINGUAL | 3 refills | Status: DC | PRN

## 2016-12-05 MED ORDER — ASPIRIN EC 325 MG PO TBEC: 325.0000 mg | DELAYED_RELEASE_TABLET | Freq: Every day | ORAL | 0 refills | Status: DC

## 2016-12-05 NOTE — Progress Notes (Signed)
Anna Marsh  MRN: [id number] DOB: 1945/11/07  Subjective:  HPI  Patient is here to discuss itching she has been having for almost 2 weeks now in her hands and feet. No visible rash or redness present and she took Benadryl yesterday. Itching has been getting worse. Patient wonders if she is having reaction to a medication Plavix or Gabapentin. She has also noticed her bowels got much worse since been on Plavix. She is having bowel incontinence and is unable to leave home until she knows she is done having a bowel movement. She has been on Plavix 1 month and Gabapentin 100 mg since 2/27 and as of yesterday 12/04/16 she stopped Gabapentin to see if it will help.  Patient Active Problem List   Diagnosis Date Noted  . TIA (transient ischemic attack) 11/07/2016  . Pre-diabetes 07/20/2015  . Acute anxiety 07/20/2015  . Cardiomyopathy ([place]) 02/28/2015  . 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 GERD (gastroesophageal reflux disease) 02/28/2015  . Hypercholesterolemia 02/28/2015  . Hypertension 02/28/2015  . Cardiac defibrillator in place 02/28/2015  . Block, bundle branch, left 02/28/2015  . Adiposity 02/28/2015  . Acid reflux 02/28/2015  . 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 Chronic systolic heart failure ([place]) 02/24/2015  . H/O cardiac catheterization 02/24/2015  . 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 Diabetes mellitus, type 2 ([place]) 01/29/2015  . Angina pectoris ([place]) 12/25/2013  . Arthritis 12/25/2013  . 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 History of cardiac arrest   . Hypercholesteremia   . Hypertension     Social History   Social History  . Marital status: Married    Spouse name: N/A  . Number of children: N/A  . Years of education: N/A   Occupational History  . Not on file.   Social History Main Topics  . Smoking status: Former Smoker    Packs/day: 0.50    Years: 20.00     Quit date: 10/03/2003  . Smokeless tobacco: Never Used  . Alcohol use No  . Drug use: No  . Sexual activity: No   Other Topics Concern  . Not on file   Social History Narrative  . No narrative on file    Outpatient Encounter Prescriptions as of 12/05/2016  Medication Sig Note  . aspirin 81 MG tablet Take by mouth. 02/28/2015: Received from: [facility]  . atorvastatin (LIPITOR) 80 MG tablet Take 1 tablet by mouth  daily   . clopidogrel (PLAVIX) 75 MG tablet Take 1 tablet (75 mg total) by mouth daily.   . cyanocobalamin 1000 MCG tablet Take by mouth. 02/28/2015: Received from: [facility]  . ENTRESTO 24-26 MG  10/26/2015: Received from: [facility]  . furosemide (LASIX) 40 MG tablet Take 1 tablet (40 mg total) by mouth daily.   . hydrochlorothiazide (HYDRODIURIL) 25 MG tablet    . loratadine (CLARITIN) 10 MG tablet Take by mouth. 02/28/2015: Received from: [facility]  . metoprolol succinate (TOPROL-XL) 25 MG 24 hr tablet Take 1 tablet (25 mg total) by mouth daily.   . MULTIPLE VITAMIN PO Take by mouth. 02/28/2015: Received from: [facility]  . nitroGLYCERIN (NITROSTAT) 0.4 MG SL tablet Place 1 tablet (0.4 mg total) under the tongue every 5 (five) minutes as needed for chest pain.   Marland Kitchen omeprazole (PRILOSEC) 20 MG capsule Take 1 capsule by mouth  daily   . potassium chloride (K-DUR)  10 MEQ tablet Take 1 tablet by mouth two  times daily   . sertraline (ZOLOFT) 50 MG tablet Take 1 tablet by mouth  daily   . [DISCONTINUED] potassium chloride (K-DUR,KLOR-CON) 10 MEQ tablet     No facility-administered encounter medications on file as of 12/05/2016.     Allergies  Allergen Reactions  . Iodinated Diagnostic Agents Other (See Comments)  . Iodine     Contrast Dye  . Lisinopril Cough  . Penicillins     Review of Systems  Constitutional: Positive for malaise/fatigue.  Respiratory: Negative.   Cardiovascular:  Negative.   Skin: Positive for itching.  Endo/Heme/Allergies: Negative.   Psychiatric/Behavioral: Negative.     Objective:  BP (!) 104/58   Pulse 82   Temp 98.1 F (36.7 C)   Resp 14   Wt 250 lb (113.4 kg)   BMI 44.29 kg/m   Physical Exam  Constitutional: She is oriented to person, place, and time and well-developed, well-nourished, and in no distress.  HENT:  Head: Normocephalic and atraumatic.  Right Ear: External ear normal.  Left Ear: External ear normal.  Nose: Nose normal.  Eyes: Conjunctivae are normal. Pupils are equal, round, and reactive to light.  Neck: Normal range of motion. Neck supple. No thyromegaly present.  Cardiovascular: Normal rate, regular rhythm, normal heart sounds and intact distal pulses.   No murmur heard. Pulmonary/Chest: Effort normal and breath sounds normal. No respiratory distress. She has no wheezes.  Abdominal: She exhibits no distension. There is no tenderness.  Neurological: She is alert and oriented to person, place, and time.  Skin: Skin is warm and dry.  Psychiatric: Mood, memory, affect and judgment normal.    Assessment and Plan :  1. Pruritus Will check labs. This could be coming from Plavix-discussed possible risks of stopping this medication but will go ahead and stop and increase Aspirin to 325 mg and follow up with vascular if it safe to stay off Plavix. - CBC w/Diff/Platelet - Comprehensive metabolic panel  2. Angina pectoris ([place]) Refill given today. - nitroGLYCERIN (NITROSTAT) 0.4 MG SL tablet; Place 1 tablet (0.4 mg total) under the tongue every 5 (five) minutes as needed for chest pain.  Dispense: 30 tablet; Refill: 3 - Ambulatory referral to Vascular Surgery  3. Carotid artery plaque, bilateral Refer to vascular for further work up.  - Ambulatory referral to Vascular Surgery  4. Acute nasopharyngitis Seems to be viral. Will get chest xray to rule out any other issues. Follow as needed for this specific issue. 5.  Bowel incontinence Will follow, this could be getting worse from Plavix will re access on the next visit.  HPI, Exam and A&P transcribed under direction and in the presence of Miguel Aschoff, MD. I have done the exam and reviewed the chart and it is accurate to the best of my knowledge. Development worker, community has been used and  any errors in dictation or transcription are unintentional. Miguel Aschoff M.D. [facility]

## 2016-12-05 NOTE — Telephone Encounter (Signed)
Patient advised as below.  

## 2016-12-05 NOTE — Telephone Encounter (Signed)
-----   Message from Jerrol Banana., MD sent at 12/05/2016  2:34 PM EST ----- [place] CXR.

## 2016-12-06 ENCOUNTER — Telehealth: Payer: Self-pay

## 2016-12-06 LAB — CBC WITH DIFFERENTIAL/PLATELET
BASOS ABS: 0 10*3/uL (ref 0.0–0.2)
Basos: 1 %
EOS (ABSOLUTE): 0.2 10*3/uL (ref 0.0–0.4)
EOS: 4 %
HEMOGLOBIN: 12 g/dL (ref 11.1–15.9)
Hematocrit: 36 % (ref 34.0–46.6)
IMMATURE GRANS (ABS): 0 10*3/uL (ref 0.0–0.1)
IMMATURE GRANULOCYTES: 0 %
LYMPHS ABS: 1.4 10*3/uL (ref 0.7–3.1)
LYMPHS: 24 %
MCH: 28.4 pg (ref 26.6–33.0)
MCHC: 33.3 g/dL (ref 31.5–35.7)
MCV: 85 fL (ref 79–97)
Monocytes Absolute: 0.6 10*3/uL (ref 0.1–0.9)
Monocytes: 10 %
NEUTROS PCT: 61 %
Neutrophils Absolute: 3.5 10*3/uL (ref 1.4–7.0)
Platelets: 239 10*3/uL (ref 150–379)
RBC: 4.23 x10E6/uL (ref 3.77–5.28)
RDW: 14.3 % (ref 12.3–15.4)
WBC: 5.6 10*3/uL (ref 3.4–10.8)

## 2016-12-06 LAB — COMPREHENSIVE METABOLIC PANEL
ALBUMIN: 4 g/dL (ref 3.5–4.8)
ALK PHOS: 104 IU/L (ref 39–117)
ALT: 15 IU/L (ref 0–32)
AST: 19 IU/L (ref 0–40)
Albumin/Globulin Ratio: 1.6 (ref 1.2–2.2)
BUN / CREAT RATIO: 18 (ref 12–28)
BUN: 17 mg/dL (ref 8–27)
Bilirubin Total: 0.3 mg/dL (ref 0.0–1.2)
CALCIUM: 9.7 mg/dL (ref 8.7–10.3)
CO2: 30 mmol/L — AB (ref 18–29)
CREATININE: 0.95 mg/dL (ref 0.57–1.00)
Chloride: 96 mmol/L (ref 96–106)
GFR calc Af Amer: 70 mL/min/{1.73_m2} (ref >59)
GFR, EST NON AFRICAN AMERICAN: 61 mL/min/{1.73_m2} (ref >59)
GLUCOSE: 93 mg/dL (ref 65–99)
Globulin, Total: 2.5 g/dL (ref 1.5–4.5)
Potassium: 3.2 mmol/L — ABNORMAL LOW (ref 3.5–5.2)
Sodium: 139 mmol/L (ref 134–144)
Total Protein: 6.5 g/dL (ref 6.0–8.5)

## 2016-12-06 NOTE — Telephone Encounter (Signed)
Patient advised.

## 2016-12-06 NOTE — Telephone Encounter (Signed)
-----   Message from Jerrol Banana., MD sent at 12/06/2016 10:04 AM EST ----- Yes--4 daily for 1 week then back to regular dosing.Check CBC/K on next OV.

## 2016-12-07 DIAGNOSIS — H2513 Age-related nuclear cataract, bilateral: Secondary | ICD-10-CM | POA: Diagnosis not present

## 2016-12-12 ENCOUNTER — Encounter: Payer: Self-pay | Admitting: *Deleted

## 2016-12-13 ENCOUNTER — Telehealth: Payer: Self-pay | Admitting: Family Medicine

## 2016-12-13 NOTE — Telephone Encounter (Signed)
Pharmacy changed

## 2016-12-13 NOTE — Telephone Encounter (Signed)
Pt requesting the [facility] be changed to [facility].  Pt states she wants to leave mail order the same.

## 2016-12-14 NOTE — Discharge Instructions (Signed)
Cataract Surgery, Care After °Refer to this sheet in the next few weeks. These instructions provide you with information about caring for yourself after your procedure. Your health care provider may also give you more specific instructions. Your treatment has been planned according to current medical practices, but problems sometimes occur. Call your health care provider if you have any problems or questions after your procedure. °What can I expect after the procedure? °After the procedure, it is common to have: °· Itching. °· Discomfort. °· Fluid discharge. °· Sensitivity to light and to touch. °· Bruising. °Follow these instructions at home: °Eye Care  °· Check your eye every day for signs of infection. Watch for: °¨ Redness, swelling, or pain. °¨ Fluid, blood, or pus. °¨ Warmth. °¨ Bad smell. °Activity  °· Avoid strenuous activities, such as playing contact sports, for as long as told by your health care provider. °· Do not drive or operate heavy machinery until your health care provider approves. °· Do not bend or lift heavy objects . Bending increases pressure in the eye. You can walk, climb stairs, and do light household chores. °· Ask your health care provider when you can return to work. If you work in a dusty environment, you may be advised to wear protective eyewear for a period of time. °General instructions  °· Take or apply over-the-counter and prescription medicines only as told by your health care provider. This includes eye drops. °· Do not touch or rub your eyes. °· If you were given a protective shield, wear it as told by your health care provider. If you were not given a protective shield, wear sunglasses as told by your health care provider to protect your eyes. °· Keep the area around your eye clean and dry. Avoid swimming or allowing water to hit you directly in the face while showering until told by your health care provider. Keep soap and shampoo out of your eyes. °· Do not put a contact lens  into the affected eye or eyes until your health care provider approves. °· Keep all follow-up visits as told by your health care provider. This is important. °Contact a health care provider if: ° °· You have increased bruising around your eye. °· You have pain that is not helped with medicine. °· You have a fever. °· You have redness, swelling, or pain in your eye. °· You have fluid, blood, or pus coming from your incision. °· Your vision gets worse. °Get help right away if: °· You have sudden vision loss. °This information is not intended to replace advice given to you by your health care provider. Make sure you discuss any questions you have with your health care provider. °Document Released: 04/07/2005 Document Revised: 01/27/2016 Document Reviewed: 07/29/2015 °Elsevier Interactive Patient Education © 2017 Elsevier Inc. ° ° ° ° °General Anesthesia, Adult, Care After °These instructions provide you with information about caring for yourself after your procedure. Your health care provider may also give you more specific instructions. Your treatment has been planned according to current medical practices, but problems sometimes occur. Call your health care provider if you have any problems or questions after your procedure. °What can I expect after the procedure? °After the procedure, it is common to have: °· Vomiting. °· A sore throat. °· Mental slowness. °It is common to feel: °· Nauseous. °· Cold or shivery. °· Sleepy. °· Tired. °· Sore or achy, even in parts of your body where you did not have surgery. °Follow these instructions at   home: °For at least 24 hours after the procedure:  °· Do not: °¨ Participate in activities where you could fall or become injured. °¨ Drive. °¨ Use heavy machinery. °¨ Drink alcohol. °¨ Take sleeping pills or medicines that cause drowsiness. °¨ Make important decisions or sign legal documents. °¨ Take care of children on your own. °· Rest. °Eating and drinking  °· If you vomit, drink  water, juice, or soup when you can drink without vomiting. °· Drink enough fluid to keep your urine clear or pale yellow. °· Make sure you have little or no nausea before eating solid foods. °· Follow the diet recommended by your health care provider. °General instructions  °· Have a responsible adult stay with you until you are awake and alert. °· Return to your normal activities as told by your health care provider. Ask your health care provider what activities are safe for you. °· Take over-the-counter and prescription medicines only as told by your health care provider. °· If you smoke, do not smoke without supervision. °· Keep all follow-up visits as told by your health care provider. This is important. °Contact a health care provider if: °· You continue to have nausea or vomiting at home, and medicines are not helpful. °· You cannot drink fluids or start eating again. °· You cannot urinate after 8-12 hours. °· You develop a skin rash. °· You have fever. °· You have increasing redness at the site of your procedure. °Get help right away if: °· You have difficulty breathing. °· You have chest pain. °· You have unexpected bleeding. °· You feel that you are having a life-threatening or urgent problem. °This information is not intended to replace advice given to you by your health care provider. Make sure you discuss any questions you have with your health care provider. °Document Released: 12/25/2000 Document Revised: 02/21/2016 Document Reviewed: 09/02/2015 °Elsevier Interactive Patient Education © 2017 Elsevier Inc. ° °

## 2016-12-18 ENCOUNTER — Ambulatory Visit: Payer: Medicare Other | Admitting: Anesthesiology

## 2016-12-18 ENCOUNTER — Ambulatory Visit
Admission: RE | Admit: 2016-12-18 | Discharge: 2016-12-18 | Disposition: A | Discharge status: Home / Self Care | Payer: Medicare Other | Source: Ambulatory Visit | Attending: Ophthalmology | Admitting: Ophthalmology

## 2016-12-18 ENCOUNTER — Encounter
Admission: RE | Disposition: A | Discharge status: Home / Self Care | Payer: Self-pay | Source: Ambulatory Visit | Attending: Ophthalmology

## 2016-12-18 DIAGNOSIS — E1136 Type 2 diabetes mellitus with diabetic cataract: Secondary | ICD-10-CM | POA: Insufficient documentation

## 2016-12-18 DIAGNOSIS — Z79899 Other long term (current) drug therapy: Secondary | ICD-10-CM | POA: Insufficient documentation

## 2016-12-18 DIAGNOSIS — Z8673 Personal history of transient ischemic attack (TIA), and cerebral infarction without residual deficits: Secondary | ICD-10-CM | POA: Insufficient documentation

## 2016-12-18 DIAGNOSIS — M1990 Unspecified osteoarthritis, unspecified site: Secondary | ICD-10-CM | POA: Insufficient documentation

## 2016-12-18 DIAGNOSIS — I110 Hypertensive heart disease with heart failure: Secondary | ICD-10-CM | POA: Insufficient documentation

## 2016-12-18 DIAGNOSIS — F419 Anxiety disorder, unspecified: Secondary | ICD-10-CM | POA: Insufficient documentation

## 2016-12-18 DIAGNOSIS — H2512 Age-related nuclear cataract, left eye: Secondary | ICD-10-CM | POA: Diagnosis not present

## 2016-12-18 DIAGNOSIS — H2513 Age-related nuclear cataract, bilateral: Secondary | ICD-10-CM | POA: Diagnosis not present

## 2016-12-18 DIAGNOSIS — G709 Myoneural disorder, unspecified: Secondary | ICD-10-CM | POA: Diagnosis not present

## 2016-12-18 DIAGNOSIS — Z87891 Personal history of nicotine dependence: Secondary | ICD-10-CM | POA: Insufficient documentation

## 2016-12-18 DIAGNOSIS — K219 Gastro-esophageal reflux disease without esophagitis: Secondary | ICD-10-CM | POA: Diagnosis not present

## 2016-12-18 DIAGNOSIS — Z950 Presence of cardiac pacemaker: Secondary | ICD-10-CM | POA: Diagnosis not present

## 2016-12-18 DIAGNOSIS — I509 Heart failure, unspecified: Secondary | ICD-10-CM | POA: Diagnosis not present

## 2016-12-18 HISTORY — DX: Heart failure, unspecified: I50.9

## 2016-12-18 HISTORY — DX: Presence of automatic (implantable) cardiac defibrillator: Z95.810

## 2016-12-18 HISTORY — DX: Radiculopathy, cervical region: M54.12

## 2016-12-18 HISTORY — DX: Atrioventricular block, complete: I44.2

## 2016-12-18 HISTORY — DX: Presence of cardiac pacemaker: Z95.0

## 2016-12-18 HISTORY — DX: Gastro-esophageal reflux disease without esophagitis: K21.9

## 2016-12-18 HISTORY — DX: Other symptoms and signs involving the musculoskeletal system: R29.898

## 2016-12-18 HISTORY — PX: CATARACT EXTRACTION W/PHACO: SHX586

## 2016-12-18 HISTORY — DX: Prediabetes: R73.03

## 2016-12-18 HISTORY — DX: Chronic systolic (congestive) heart failure: I50.22

## 2016-12-18 HISTORY — DX: Anxiety disorder, unspecified: F41.9

## 2016-12-18 SURGERY — PHACOEMULSIFICATION, CATARACT, WITH IOL INSERTION
Anesthesia: Monitor Anesthesia Care | Site: Eye | Laterality: Left | Wound class: Clean

## 2016-12-18 MED ORDER — MIDAZOLAM HCL 2 MG/2ML IJ SOLN
INTRAMUSCULAR | Status: DC | PRN
  Administered 2016-12-18: 2 mg via INTRAVENOUS

## 2016-12-18 MED ORDER — LIDOCAINE HCL (PF) 2 % IJ SOLN
INTRAMUSCULAR | Status: DC | PRN
  Administered 2016-12-18: 1 mL

## 2016-12-18 MED ORDER — FENTANYL CITRATE (PF) 100 MCG/2ML IJ SOLN
INTRAMUSCULAR | Status: DC | PRN
  Administered 2016-12-18: 50 ug via INTRAVENOUS

## 2016-12-18 MED ORDER — NA HYALUR & NA CHOND-NA HYALUR 0.4-0.35 ML IO KIT
PACK | INTRAOCULAR | Status: DC | PRN
  Administered 2016-12-18: 1 mL via INTRAOCULAR

## 2016-12-18 MED ORDER — EPINEPHRINE PF 1 MG/ML IJ SOLN
INTRAMUSCULAR | Status: DC | PRN
  Administered 2016-12-18: 123 mL via OPHTHALMIC

## 2016-12-18 MED ORDER — MOXIFLOXACIN HCL 0.5 % OP SOLN
1.0000 [drp] | OPHTHALMIC | Status: DC | PRN
Start: 2016-12-18 — End: 2016-12-18
  Administered 2016-12-18 (×3): 1 [drp] via OPHTHALMIC

## 2016-12-18 MED ORDER — BRIMONIDINE TARTRATE-TIMOLOL 0.2-0.5 % OP SOLN
OPHTHALMIC | Status: DC | PRN
  Administered 2016-12-18: 1 [drp] via OPHTHALMIC

## 2016-12-18 MED ORDER — ARMC OPHTHALMIC DILATING DROPS
1.0000 "application " | OPHTHALMIC | Status: DC | PRN
  Administered 2016-12-18 (×3): 1 via OPHTHALMIC

## 2016-12-18 MED ORDER — MOXIFLOXACIN HCL 0.5 % OP SOLN
OPHTHALMIC | Status: DC | PRN
  Administered 2016-12-18: .2 mL via OPHTHALMIC

## 2016-12-18 SURGICAL SUPPLY — 23 items
APPLICATOR COTTON TIP 3IN (MISCELLANEOUS) ×3 IMPLANT
CANNULA ANT/CHMB 27GA (MISCELLANEOUS) ×3 IMPLANT
DISSECTOR HYDRO NUCLEUS 50X22 (MISCELLANEOUS) ×3 IMPLANT
GLOVE BIO SURGEON STRL SZ7 (GLOVE) ×3 IMPLANT
GLOVE SURG LX 6.5 MICRO (GLOVE) ×2
GLOVE SURG LX STRL 6.5 MICRO (GLOVE) ×1 IMPLANT
GOWN STRL REUS W/ TWL LRG LVL3 (GOWN DISPOSABLE) ×2 IMPLANT
GOWN STRL REUS W/TWL LRG LVL3 (GOWN DISPOSABLE) ×4
LENS IOL ACRSF IQ ULTRA 16.5 (Intraocular Lens) ×1 IMPLANT
LENS IOL ACRYSOF IQ 16.5 (Intraocular Lens) ×3 IMPLANT
MARKER SKIN DUAL TIP RULER LAB (MISCELLANEOUS) ×3 IMPLANT
PACK CATARACT BRASINGTON (MISCELLANEOUS) ×3 IMPLANT
PACK EYE AFTER SURG (MISCELLANEOUS) ×3 IMPLANT
PACK OPTHALMIC (MISCELLANEOUS) ×3 IMPLANT
RING MALYGIN 7.0 (MISCELLANEOUS) IMPLANT
SUT ETHILON 10-0 CS-B-6CS-B-6 (SUTURE)
SUT VICRYL  9 0 (SUTURE)
SUT VICRYL 9 0 (SUTURE) IMPLANT
SUTURE EHLN 10-0 CS-B-6CS-B-6 (SUTURE) IMPLANT
SYR TB 1ML LUER SLIP (SYRINGE) ×3 IMPLANT
WATER STERILE IRR 250ML POUR (IV SOLUTION) ×3 IMPLANT
WICK EYE OCUCEL (MISCELLANEOUS) IMPLANT
WIPE NON LINTING 3.25X3.25 (MISCELLANEOUS) ×3 IMPLANT

## 2016-12-18 NOTE — Transfer of Care (Signed)
Immediate Anesthesia Transfer of Care Note  Patient: Anna Marsh  Procedure(s) Performed: Procedure(s): CATARACT EXTRACTION PHACO AND INTRAOCULAR LENS PLACEMENT (IOC)  left (Left)  Patient Location: PACU  Anesthesia Type: MAC  Level of Consciousness: awake, alert  and patient cooperative  Airway and Oxygen Therapy: Patient Spontanous Breathing and Patient connected to supplemental oxygen  Post-op Assessment: Post-op Vital signs reviewed, Patient's Cardiovascular Status Stable, Respiratory Function Stable, Patent Airway and No signs of Nausea or vomiting  Post-op Vital Signs: Reviewed and stable  Complications: No apparent anesthesia complications

## 2016-12-18 NOTE — Anesthesia Postprocedure Evaluation (Signed)
Anesthesia Post Note  Patient: Anna Marsh  Procedure(s) Performed: Procedure(s) (LRB): CATARACT EXTRACTION PHACO AND INTRAOCULAR LENS PLACEMENT (IOC)  left (Left)  Patient location during evaluation: PACU Anesthesia Type: MAC Level of consciousness: awake and alert and oriented Pain management: pain level controlled Vital Signs Assessment: post-procedure vital signs reviewed and stable Respiratory status: spontaneous breathing and nonlabored ventilation Cardiovascular status: stable Postop Assessment: no signs of nausea or vomiting and adequate PO intake Anesthetic complications: no    Estill Batten

## 2016-12-18 NOTE — H&P (Signed)
H+P reviewed and is up to date, please see paper chart.  

## 2016-12-18 NOTE — Op Note (Signed)
Date of Surgery: 12/18/2016  PREOPERATIVE DIAGNOSES: Visually significant nuclear sclerotic cataract, left eye.  POSTOPERATIVE DIAGNOSES: Same  PROCEDURES PERFORMED: Cataract extraction with intraocular lens implant, left eye.  SURGEON: Almon Hercules, M.D.  ANESTHESIA: MAC and topical  IMPLANTS: [id number] +16.5 D  Implant Name Type Inv. Item Serial No. Manufacturer Lot No. LRB No. Used  LENS IOL ACRYSOF IQ 16.5 - [id number] Intraocular Lens LENS IOL ACRYSOF IQ 16.5 [id number] ALCON   Left 1    COMPLICATIONS: None.  DESCRIPTION OF PROCEDURE: Therapeutic options were discussed with the patient preoperatively, including a discussion of risks and benefits of surgery. Informed consent was obtained. An IOL-Master and immersion biometry were used to take the lens measurements, and a dilated fundus exam was performed within 6 months of the surgical date.  The patient was premedicated and brought to the operating room and placed on the operating table in the supine position. After adequate anesthesia, the patient was prepped and draped in the usual sterile ophthalmic fashion. A wire lid speculum was inserted and the microscope was positioned. A Superblade was used to create a paracentesis site at the limbus and a small amount of dilute preservative free lidocaine was instilled into the anterior chamber, followed by dispersive viscoelastic. A clear corneal incision was created temporally using a 2.4 mm keratome blade. Capsulorrhexis was then performed. In situ phacoemulsification was performed.  Cortical material was removed with the irrigation-aspiration unit. Dispersive viscoelastic was instilled to open the capsular bag. A posterior chamber intraocular lens with the specifications above was inserted and positioned. Irrigation-aspiration was used to remove all viscoelastic. Vigamox 1cc was instilled into the anterior chamber, and the corneal incision was checked and found to be water tight. The eyelid  speculum was removed.  The operative eye was covered with protective goggles after instilling 1 drop of timolol and brimonidine. The patient tolerated the procedure well. There were no complications.

## 2016-12-18 NOTE — Anesthesia Preprocedure Evaluation (Signed)
Anesthesia Evaluation  Patient identified by MRN, date of birth, ID band  Reviewed: Allergy & Precautions, NPO status , Patient's Chart, lab work & pertinent test results  Airway Mallampati: II  TM Distance: >3 FB Neck ROM: Full    Dental no notable dental hx.    Pulmonary former smoker,    Pulmonary exam normal        Cardiovascular hypertension, +CHF  Normal cardiovascular exam+ dysrhythmias + pacemaker + Cardiac Defibrillator      Neuro/Psych Anxiety TIA Neuromuscular disease    GI/Hepatic Neg liver ROS, GERD  Medicated and Controlled,  Endo/Other  diabetes (Pre-Diabetes), Well Controlled  Renal/GU negative Renal ROS     Musculoskeletal  (+) Arthritis , Osteoarthritis,    Abdominal   Peds  Hematology negative hematology ROS (+)   Anesthesia Other Findings   Reproductive/Obstetrics                             Anesthesia Physical Anesthesia Plan  ASA: III  Anesthesia Plan: MAC   Post-op Pain Management:    Induction: Intravenous  Airway Management Planned:   Additional Equipment:   Intra-op Plan:   Post-operative Plan:   Informed Consent: I have reviewed the patients History and Physical, chart, labs and discussed the procedure including the risks, benefits and alternatives for the proposed anesthesia with the patient or authorized representative who has indicated his/her understanding and acceptance.     Plan Discussed with: CRNA  Anesthesia Plan Comments:         Anesthesia Quick Evaluation

## 2016-12-18 NOTE — Anesthesia Procedure Notes (Signed)
Procedure Name: MAC Performed by: Mayme Genta Pre-anesthesia Checklist: Patient identified, Emergency Drugs available, Suction available, Timeout performed and Patient being monitored Patient Re-evaluated:Patient Re-evaluated prior to inductionOxygen Delivery Method: Nasal cannula Placement Confirmation: positive ETCO2

## 2016-12-19 ENCOUNTER — Other Ambulatory Visit: Payer: Self-pay | Admitting: Emergency Medicine

## 2016-12-19 ENCOUNTER — Encounter: Payer: Self-pay | Admitting: Ophthalmology

## 2016-12-19 NOTE — Progress Notes (Signed)
error 

## 2016-12-21 ENCOUNTER — Ambulatory Visit: Payer: Medicare Other | Admitting: Family Medicine

## 2016-12-26 ENCOUNTER — Ambulatory Visit (INDEPENDENT_AMBULATORY_CARE_PROVIDER_SITE_OTHER): Payer: Medicare Other | Admitting: Family Medicine

## 2016-12-26 ENCOUNTER — Encounter: Payer: Self-pay | Admitting: Family Medicine

## 2016-12-26 VITALS — BP 104/52 | HR 68 | Temp 97.8°F | Resp 16 | Wt 253.0 lb

## 2016-12-26 DIAGNOSIS — L299 Pruritus, unspecified: Secondary | ICD-10-CM | POA: Diagnosis not present

## 2016-12-26 DIAGNOSIS — R059 Cough, unspecified: Secondary | ICD-10-CM

## 2016-12-26 DIAGNOSIS — R05 Cough: Secondary | ICD-10-CM | POA: Diagnosis not present

## 2016-12-26 DIAGNOSIS — H2511 Age-related nuclear cataract, right eye: Secondary | ICD-10-CM | POA: Diagnosis not present

## 2016-12-26 NOTE — Progress Notes (Signed)
Patient: Anna Marsh Female    DOB: July 30, 1946   71 y.o.   MRN: [id number] Visit Date: 12/26/2016  Today's Provider: Wilhemena Durie, MD   Chief Complaint  Patient presents with  . Follow-up   Subjective:    HPI Patient comes in today for a follow up. She was seen in the office 2 weeks ago c/o itching in her hands and feet. Patient reports that it may have been coming from the Plavix. Patient reports that her itching has improved since she has been off of the Plavix.     Allergies  Allergen Reactions  . Iodinated Diagnostic Agents Other (See Comments)    Causes migraines  . Iodine     Contrast Dye causes migraines  . Lisinopril Cough  . Penicillins Rash     Current Outpatient Prescriptions:  .  aspirin EC 325 MG tablet, Take 1 tablet (325 mg total) by mouth daily., Disp: 30 tablet, Rfl: 0 .  atorvastatin (LIPITOR) 80 MG tablet, Take 1 tablet by mouth  daily, Disp: 90 tablet, Rfl: 3 .  cyanocobalamin 1000 MCG tablet, Take by mouth., Disp: , Rfl:  .  ENTRESTO 24-26 MG, , Disp: , Rfl:  .  furosemide (LASIX) 40 MG tablet, Take 1 tablet (40 mg total) by mouth daily., Disp: 90 tablet, Rfl: 3 .  gabapentin (NEURONTIN) 100 MG capsule, Take 100 mg by mouth at bedtime., Disp: , Rfl:  .  hydrochlorothiazide (HYDRODIURIL) 25 MG tablet, , Disp: , Rfl:  .  loratadine (CLARITIN) 10 MG tablet, Take by mouth., Disp: , Rfl:  .  metoprolol succinate (TOPROL-XL) 25 MG 24 hr tablet, Take 1 tablet (25 mg total) by mouth daily., Disp: 90 tablet, Rfl: 3 .  MULTIPLE VITAMIN PO, Take by mouth., Disp: , Rfl:  .  nitroGLYCERIN (NITROSTAT) 0.4 MG SL tablet, Place 1 tablet (0.4 mg total) under the tongue every 5 (five) minutes as needed for chest pain., Disp: 30 tablet, Rfl: 3 .  omeprazole (PRILOSEC) 20 MG capsule, , Disp: , Rfl:  .  potassium chloride (K-DUR) 10 MEQ tablet, Take 1 tablet by mouth two  times daily, Disp: 180 tablet, Rfl: 3 .  sertraline (ZOLOFT) 50 MG tablet, Take 1  tablet by mouth  daily, Disp: 90 tablet, Rfl: 3  Review of Systems  Constitutional: Negative.   Respiratory: Negative.   Cardiovascular: Negative.   Musculoskeletal: Negative.   Skin: Negative.  Negative for color change, pallor, rash and wound.  Allergic/Immunologic: Negative.   Neurological: Negative.   Psychiatric/Behavioral: Negative.     Social History  Substance Use Topics  . Smoking status: Former Smoker    Packs/day: 0.50    Years: 20.00    Quit date: 10/03/2003  . Smokeless tobacco: Never Used  . Alcohol use No   Objective:   BP (!) 104/52 (BP Location: Right Arm, Patient Position: Sitting, Cuff Size: Large)   Pulse 68   Temp 97.8 F (36.6 C)   Resp 16   Wt 253 lb (114.8 kg)   SpO2 97%   BMI 44.82 kg/m  Vitals:   12/26/16 1037  BP: (!) 104/52  Pulse: 68  Resp: 16  Temp: 97.8 F (36.6 C)  SpO2: 97%  Weight: 253 lb (114.8 kg)     Physical Exam  Constitutional: She is oriented to person, place, and time. She appears well-developed and well-nourished.  HENT:  Head: Normocephalic and atraumatic.  Eyes: Conjunctivae are normal. No scleral icterus.  Neck: No thyromegaly present.  Cardiovascular: Normal rate, regular rhythm and normal heart sounds.   Pulmonary/Chest: Effort normal and breath sounds normal.  Abdominal: Soft.  Neurological: She is alert and oriented to person, place, and time.  Skin: Skin is warm and dry. No rash noted. No erythema.  Psychiatric: She has a normal mood and affect. Her behavior is normal. Judgment and thought content normal.        Assessment & Plan:      1. Pruritus Resoved.  2. Cough Improving.  I have done the exam and reviewed the chart and it is accurate to the best of my knowledge. Development worker, community has been used and  any errors in dictation or transcription are unintentional. Miguel Aschoff M.D. [facility]       Wilhemena Durie, MD

## 2016-12-27 ENCOUNTER — Encounter: Payer: Self-pay | Admitting: *Deleted

## 2016-12-28 DIAGNOSIS — M47812 Spondylosis without myelopathy or radiculopathy, cervical region: Secondary | ICD-10-CM | POA: Diagnosis not present

## 2016-12-28 NOTE — Discharge Instructions (Signed)
Cataract Surgery, Care After °Refer to this sheet in the next few weeks. These instructions provide you with information about caring for yourself after your procedure. Your health care provider may also give you more specific instructions. Your treatment has been planned according to current medical practices, but problems sometimes occur. Call your health care provider if you have any problems or questions after your procedure. °What can I expect after the procedure? °After the procedure, it is common to have: °· Itching. °· Discomfort. °· Fluid discharge. °· Sensitivity to light and to touch. °· Bruising. °Follow these instructions at home: °Eye Care  °· Check your eye every day for signs of infection. Watch for: °¨ Redness, swelling, or pain. °¨ Fluid, blood, or pus. °¨ Warmth. °¨ Bad smell. °Activity  °· Avoid strenuous activities, such as playing contact sports, for as long as told by your health care provider. °· Do not drive or operate heavy machinery until your health care provider approves. °· Do not bend or lift heavy objects . Bending increases pressure in the eye. You can walk, climb stairs, and do light household chores. °· Ask your health care provider when you can return to work. If you work in a dusty environment, you may be advised to wear protective eyewear for a period of time. °General instructions  °· Take or apply over-the-counter and prescription medicines only as told by your health care provider. This includes eye drops. °· Do not touch or rub your eyes. °· If you were given a protective shield, wear it as told by your health care provider. If you were not given a protective shield, wear sunglasses as told by your health care provider to protect your eyes. °· Keep the area around your eye clean and dry. Avoid swimming or allowing water to hit you directly in the face while showering until told by your health care provider. Keep soap and shampoo out of your eyes. °· Do not put a contact lens  into the affected eye or eyes until your health care provider approves. °· Keep all follow-up visits as told by your health care provider. This is important. °Contact a health care provider if: ° °· You have increased bruising around your eye. °· You have pain that is not helped with medicine. °· You have a fever. °· You have redness, swelling, or pain in your eye. °· You have fluid, blood, or pus coming from your incision. °· Your vision gets worse. °Get help right away if: °· You have sudden vision loss. °This information is not intended to replace advice given to you by your health care provider. Make sure you discuss any questions you have with your health care provider. °Document Released: 04/07/2005 Document Revised: 01/27/2016 Document Reviewed: 07/29/2015 °Elsevier Interactive Patient Education © 2017 Elsevier Inc. ° ° ° ° °General Anesthesia, Adult, Care After °These instructions provide you with information about caring for yourself after your procedure. Your health care provider may also give you more specific instructions. Your treatment has been planned according to current medical practices, but problems sometimes occur. Call your health care provider if you have any problems or questions after your procedure. °What can I expect after the procedure? °After the procedure, it is common to have: °· Vomiting. °· A sore throat. °· Mental slowness. °It is common to feel: °· Nauseous. °· Cold or shivery. °· Sleepy. °· Tired. °· Sore or achy, even in parts of your body where you did not have surgery. °Follow these instructions at   home: °For at least 24 hours after the procedure:  °· Do not: °¨ Participate in activities where you could fall or become injured. °¨ Drive. °¨ Use heavy machinery. °¨ Drink alcohol. °¨ Take sleeping pills or medicines that cause drowsiness. °¨ Make important decisions or sign legal documents. °¨ Take care of children on your own. °· Rest. °Eating and drinking  °· If you vomit, drink  water, juice, or soup when you can drink without vomiting. °· Drink enough fluid to keep your urine clear or pale yellow. °· Make sure you have little or no nausea before eating solid foods. °· Follow the diet recommended by your health care provider. °General instructions  °· Have a responsible adult stay with you until you are awake and alert. °· Return to your normal activities as told by your health care provider. Ask your health care provider what activities are safe for you. °· Take over-the-counter and prescription medicines only as told by your health care provider. °· If you smoke, do not smoke without supervision. °· Keep all follow-up visits as told by your health care provider. This is important. °Contact a health care provider if: °· You continue to have nausea or vomiting at home, and medicines are not helpful. °· You cannot drink fluids or start eating again. °· You cannot urinate after 8-12 hours. °· You develop a skin rash. °· You have fever. °· You have increasing redness at the site of your procedure. °Get help right away if: °· You have difficulty breathing. °· You have chest pain. °· You have unexpected bleeding. °· You feel that you are having a life-threatening or urgent problem. °This information is not intended to replace advice given to you by your health care provider. Make sure you discuss any questions you have with your health care provider. °Document Released: 12/25/2000 Document Revised: 02/21/2016 Document Reviewed: 09/02/2015 °Elsevier Interactive Patient Education © 2017 Elsevier Inc. ° °

## 2016-12-29 DIAGNOSIS — M79601 Pain in right arm: Secondary | ICD-10-CM | POA: Diagnosis not present

## 2016-12-29 DIAGNOSIS — G959 Disease of spinal cord, unspecified: Secondary | ICD-10-CM | POA: Diagnosis not present

## 2016-12-29 DIAGNOSIS — Z87891 Personal history of nicotine dependence: Secondary | ICD-10-CM | POA: Diagnosis not present

## 2016-12-29 DIAGNOSIS — M50322 Other cervical disc degeneration at C5-C6 level: Secondary | ICD-10-CM | POA: Diagnosis not present

## 2016-12-29 DIAGNOSIS — M50122 Cervical disc disorder at C5-C6 level with radiculopathy: Secondary | ICD-10-CM | POA: Diagnosis not present

## 2016-12-29 DIAGNOSIS — Z7982 Long term (current) use of aspirin: Secondary | ICD-10-CM | POA: Diagnosis not present

## 2016-12-29 DIAGNOSIS — E119 Type 2 diabetes mellitus without complications: Secondary | ICD-10-CM | POA: Diagnosis not present

## 2016-12-29 DIAGNOSIS — M5412 Radiculopathy, cervical region: Secondary | ICD-10-CM | POA: Diagnosis not present

## 2016-12-29 DIAGNOSIS — Z880 Allergy status to penicillin: Secondary | ICD-10-CM | POA: Diagnosis not present

## 2016-12-29 DIAGNOSIS — E785 Hyperlipidemia, unspecified: Secondary | ICD-10-CM | POA: Diagnosis not present

## 2016-12-29 DIAGNOSIS — I10 Essential (primary) hypertension: Secondary | ICD-10-CM | POA: Diagnosis not present

## 2016-12-29 DIAGNOSIS — M4313 Spondylolisthesis, cervicothoracic region: Secondary | ICD-10-CM | POA: Diagnosis not present

## 2016-12-29 DIAGNOSIS — Z981 Arthrodesis status: Secondary | ICD-10-CM | POA: Diagnosis not present

## 2016-12-29 DIAGNOSIS — I447 Left bundle-branch block, unspecified: Secondary | ICD-10-CM | POA: Diagnosis not present

## 2016-12-29 DIAGNOSIS — Z79899 Other long term (current) drug therapy: Secondary | ICD-10-CM | POA: Diagnosis not present

## 2016-12-29 DIAGNOSIS — M47812 Spondylosis without myelopathy or radiculopathy, cervical region: Secondary | ICD-10-CM | POA: Diagnosis not present

## 2016-12-29 DIAGNOSIS — R29898 Other symptoms and signs involving the musculoskeletal system: Secondary | ICD-10-CM | POA: Diagnosis not present

## 2017-01-01 ENCOUNTER — Ambulatory Visit: Payer: Medicare Other | Admitting: Anesthesiology

## 2017-01-01 ENCOUNTER — Ambulatory Visit
Admission: RE | Admit: 2017-01-01 | Discharge: 2017-01-01 | Disposition: A | Discharge status: Home / Self Care | Payer: Medicare Other | Source: Ambulatory Visit | Attending: Ophthalmology | Admitting: Ophthalmology

## 2017-01-01 ENCOUNTER — Encounter
Admission: RE | Disposition: A | Discharge status: Home / Self Care | Payer: Self-pay | Source: Ambulatory Visit | Attending: Ophthalmology

## 2017-01-01 DIAGNOSIS — E7800 Pure hypercholesterolemia, unspecified: Secondary | ICD-10-CM | POA: Insufficient documentation

## 2017-01-01 DIAGNOSIS — I509 Heart failure, unspecified: Secondary | ICD-10-CM | POA: Insufficient documentation

## 2017-01-01 DIAGNOSIS — H2511 Age-related nuclear cataract, right eye: Secondary | ICD-10-CM | POA: Diagnosis not present

## 2017-01-01 DIAGNOSIS — I110 Hypertensive heart disease with heart failure: Secondary | ICD-10-CM | POA: Diagnosis not present

## 2017-01-01 DIAGNOSIS — Z8673 Personal history of transient ischemic attack (TIA), and cerebral infarction without residual deficits: Secondary | ICD-10-CM | POA: Insufficient documentation

## 2017-01-01 DIAGNOSIS — E1136 Type 2 diabetes mellitus with diabetic cataract: Secondary | ICD-10-CM | POA: Insufficient documentation

## 2017-01-01 DIAGNOSIS — Z87891 Personal history of nicotine dependence: Secondary | ICD-10-CM | POA: Diagnosis not present

## 2017-01-01 DIAGNOSIS — Z950 Presence of cardiac pacemaker: Secondary | ICD-10-CM | POA: Diagnosis not present

## 2017-01-01 DIAGNOSIS — K219 Gastro-esophageal reflux disease without esophagitis: Secondary | ICD-10-CM | POA: Diagnosis not present

## 2017-01-01 HISTORY — PX: CATARACT EXTRACTION W/PHACO: SHX586

## 2017-01-01 SURGERY — PHACOEMULSIFICATION, CATARACT, WITH IOL INSERTION
Anesthesia: Monitor Anesthesia Care | Site: Eye | Laterality: Right | Wound class: Clean

## 2017-01-01 MED ORDER — LIDOCAINE HCL (PF) 2 % IJ SOLN
INTRAMUSCULAR | Status: DC | PRN
Start: 2017-01-01 — End: 2017-01-01
  Administered 2017-01-01: 1 mL

## 2017-01-01 MED ORDER — ARMC OPHTHALMIC DILATING DROPS
1.0000 "application " | OPHTHALMIC | Status: DC | PRN
  Administered 2017-01-01 (×3): 1 via OPHTHALMIC

## 2017-01-01 MED ORDER — MOXIFLOXACIN HCL 0.5 % OP SOLN
1.0000 [drp] | OPHTHALMIC | Status: DC | PRN
  Administered 2017-01-01 (×3): 1 [drp] via OPHTHALMIC

## 2017-01-01 MED ORDER — BRIMONIDINE TARTRATE 0.2 % OP SOLN
OPHTHALMIC | Status: DC | PRN
  Administered 2017-01-01: 1 [drp] via OPHTHALMIC

## 2017-01-01 MED ORDER — FENTANYL CITRATE (PF) 100 MCG/2ML IJ SOLN
INTRAMUSCULAR | Status: DC | PRN
  Administered 2017-01-01 (×2): 25 ug via INTRAVENOUS

## 2017-01-01 MED ORDER — NA HYALUR & NA CHOND-NA HYALUR 0.4-0.35 ML IO KIT
PACK | INTRAOCULAR | Status: DC | PRN
  Administered 2017-01-01: 1 mL via INTRAOCULAR

## 2017-01-01 MED ORDER — MOXIFLOXACIN HCL 0.5 % OP SOLN
OPHTHALMIC | Status: DC | PRN
  Administered 2017-01-01: .2 mL via OPHTHALMIC

## 2017-01-01 MED ORDER — MIDAZOLAM HCL 2 MG/2ML IJ SOLN
INTRAMUSCULAR | Status: DC | PRN
  Administered 2017-01-01 (×2): 1 mg via INTRAVENOUS

## 2017-01-01 MED ORDER — BSS IO SOLN
INTRAOCULAR | Status: DC | PRN
  Administered 2017-01-01: 82 mL via OPHTHALMIC

## 2017-01-01 MED ORDER — TIMOLOL MALEATE 0.5 % OP SOLN
OPHTHALMIC | Status: DC | PRN
  Administered 2017-01-01: 1 [drp] via OPHTHALMIC

## 2017-01-01 SURGICAL SUPPLY — 23 items
APPLICATOR COTTON TIP 3IN (MISCELLANEOUS) ×3 IMPLANT
CANNULA ANT/CHMB 27GA (MISCELLANEOUS) ×3 IMPLANT
DISSECTOR HYDRO NUCLEUS 50X22 (MISCELLANEOUS) ×3 IMPLANT
GLOVE BIO SURGEON STRL SZ7 (GLOVE) ×3 IMPLANT
GLOVE SURG LX 6.5 MICRO (GLOVE) ×2
GLOVE SURG LX STRL 6.5 MICRO (GLOVE) ×1 IMPLANT
GOWN STRL REUS W/ TWL LRG LVL3 (GOWN DISPOSABLE) ×2 IMPLANT
GOWN STRL REUS W/TWL LRG LVL3 (GOWN DISPOSABLE) ×4
LENS IOL ACRSF IQ ULTRA 16.0 (Intraocular Lens) ×1 IMPLANT
LENS IOL ACRYSOF IQ 16.0 (Intraocular Lens) ×3 IMPLANT
MARKER SKIN DUAL TIP RULER LAB (MISCELLANEOUS) ×3 IMPLANT
PACK CATARACT BRASINGTON (MISCELLANEOUS) ×3 IMPLANT
PACK EYE AFTER SURG (MISCELLANEOUS) ×3 IMPLANT
PACK OPTHALMIC (MISCELLANEOUS) ×3 IMPLANT
RING MALYGIN 7.0 (MISCELLANEOUS) IMPLANT
SUT ETHILON 10-0 CS-B-6CS-B-6 (SUTURE)
SUT VICRYL  9 0 (SUTURE)
SUT VICRYL 9 0 (SUTURE) IMPLANT
SUTURE EHLN 10-0 CS-B-6CS-B-6 (SUTURE) IMPLANT
SYR TB 1ML LUER SLIP (SYRINGE) ×3 IMPLANT
WATER STERILE IRR 250ML POUR (IV SOLUTION) ×3 IMPLANT
WICK EYE OCUCEL (MISCELLANEOUS) IMPLANT
WIPE NON LINTING 3.25X3.25 (MISCELLANEOUS) ×3 IMPLANT

## 2017-01-01 NOTE — Transfer of Care (Signed)
Immediate Anesthesia Transfer of Care Note  Patient: Anna Marsh  Procedure(s) Performed: Procedure(s): CATARACT EXTRACTION PHACO AND INTRAOCULAR LENS PLACEMENT (IOC)  Right (Right)  Patient Location: PACU  Anesthesia Type: MAC  Level of Consciousness: awake, alert  and patient cooperative  Airway and Oxygen Therapy: Patient Spontanous Breathing and Patient connected to supplemental oxygen  Post-op Assessment: Post-op Vital signs reviewed, Patient's Cardiovascular Status Stable, Respiratory Function Stable, Patent Airway and No signs of Nausea or vomiting  Post-op Vital Signs: Reviewed and stable  Complications: No apparent anesthesia complications

## 2017-01-01 NOTE — H&P (Signed)
H+P reviewed and is up to date, please see paper chart.  

## 2017-01-01 NOTE — Anesthesia Postprocedure Evaluation (Addendum)
Anesthesia Post Note  Patient: Anna Marsh  Procedure(s) Performed: Procedure(s) (LRB): CATARACT EXTRACTION PHACO AND INTRAOCULAR LENS PLACEMENT (IOC)  Right (Right)  Patient location during evaluation: PACU Anesthesia Type: MAC Level of consciousness: awake and alert Vital Signs Assessment: post-procedure vital signs reviewed and stable Respiratory status: spontaneous breathing, nonlabored ventilation and respiratory function stable Cardiovascular status: stable and blood pressure returned to baseline Anesthetic complications: no    Veda Canning

## 2017-01-01 NOTE — Anesthesia Preprocedure Evaluation (Addendum)
Anesthesia Evaluation  Patient identified by MRN, date of birth, ID band Patient awake    Reviewed: Allergy & Precautions, H&P , NPO status   Airway Mallampati: II  TM Distance: >3 FB     Dental   Pulmonary neg pulmonary ROS, neg recent URI, former smoker,    breath sounds clear to auscultation       Cardiovascular hypertension, +CHF  + dysrhythmias (AV block) + pacemaker + Cardiac Defibrillator  Rhythm:regular  Hypercholesterolemia  TTE 11/2016: - Left ventricle: Wall thickness was increased in a pattern of mild   LVH. Systolic function was normal. The estimated ejection   fraction was in the range of 60% to 65%. Doppler parameters are   consistent with abnormal left ventricular relaxation (grade 1   diastolic dysfunction). - Mitral valve: Calcified annulus. - Right ventricle: The cavity size was mildly dilated.    Neuro/Psych TIA   GI/Hepatic GERD  ,  Endo/Other  diabetes  Renal/GU      Musculoskeletal   Abdominal   Peds  Hematology negative hematology ROS (+)   Anesthesia Other Findings   Reproductive/Obstetrics                                                            Anesthesia Evaluation  Patient identified by MRN, date of birth, ID band  Reviewed: Allergy & Precautions, NPO status , Patient's Chart, lab work & pertinent test results  Airway Mallampati: II  TM Distance: >3 FB Neck ROM: Full    Dental no notable dental hx.    Pulmonary former smoker,    Pulmonary exam normal        Cardiovascular hypertension, +CHF  Normal cardiovascular exam+ dysrhythmias + pacemaker + Cardiac Defibrillator      Neuro/Psych Anxiety TIA Neuromuscular disease    GI/Hepatic Neg liver ROS, GERD  Medicated and Controlled,  Endo/Other  diabetes (Pre-Diabetes), Well Controlled  Renal/GU negative Renal ROS     Musculoskeletal  (+) Arthritis , Osteoarthritis,    Abdominal   Peds  Hematology negative hematology ROS (+)   Anesthesia Other Findings   Reproductive/Obstetrics                             Anesthesia Physical Anesthesia Plan  ASA: III  Anesthesia Plan: MAC   Post-op Pain Management:    Induction: Intravenous  Airway Management Planned:   Additional Equipment:   Intra-op Plan:   Post-operative Plan:   Informed Consent: I have reviewed the patients History and Physical, chart, labs and discussed the procedure including the risks, benefits and alternatives for the proposed anesthesia with the patient or authorized representative who has indicated his/her understanding and acceptance.     Plan Discussed with: CRNA  Anesthesia Plan Comments:         Anesthesia Quick Evaluation  Anesthesia Physical Anesthesia Plan  ASA: III  Anesthesia Plan: MAC   Post-op Pain Management:    Induction:   Airway Management Planned:   Additional Equipment:   Intra-op Plan:   Post-operative Plan:   Informed Consent: I have reviewed the patients History and Physical, chart, labs and discussed the procedure including the risks, benefits and alternatives for the proposed anesthesia with the patient or authorized representative who has indicated  his/her understanding and acceptance.     Plan Discussed with: CRNA  Anesthesia Plan Comments:         Anesthesia Quick Evaluation

## 2017-01-01 NOTE — Addendum Note (Signed)
Addendum  created 01/01/17 1012 by Veda Canning, MD   Sign clinical note

## 2017-01-01 NOTE — Op Note (Signed)
Date of Surgery: 01/01/2017  PREOPERATIVE DIAGNOSES: Visually significant nuclear sclerotic cataract, right eye.  POSTOPERATIVE DIAGNOSES: Same  PROCEDURES PERFORMED: Cataract extraction with intraocular lens implant, right eye.  SURGEON: Almon Hercules, M.D.  ANESTHESIA: MAC and topical  IMPLANTS: [id number] +16.0 D  Implant Name Type Inv. Item Serial No. Manufacturer Lot No. LRB No. Used  LENS IOL ACRYSOF IQ 16.0 - [id number] Intraocular Lens LENS IOL ACRYSOF IQ 16.0 [id number] ALCON   Right 1     COMPLICATIONS: None.  DESCRIPTION OF PROCEDURE: Therapeutic options were discussed with the patient preoperatively, including a discussion of risks and benefits of surgery. Informed consent was obtained. An IOL-Master and immersion biometry were used to take the lens measurements, and a dilated fundus exam was performed within 6 months of the surgical date.  The patient was premedicated and brought to the operating room and placed on the operating table in the supine position. After adequate anesthesia, the patient was prepped and draped in the usual sterile ophthalmic fashion. A wire lid speculum was inserted and the microscope was positioned. A Superblade was used to create a paracentesis site at the limbus and a small amount of dilute preservative free lidocaine was instilled into the anterior chamber, followed by dispersive viscoelastic. A clear corneal incision was created temporally using a 2.4 mm keratome blade. Capsulorrhexis was then performed. In situ phacoemulsification was performed.  Cortical material was removed with the irrigation-aspiration unit. Dispersive viscoelastic was instilled to open the capsular bag. A posterior chamber intraocular lens with the specifications above was inserted and positioned. Irrigation-aspiration was used to remove all viscoelastic. Vigamox 1cc was instilled into the anterior chamber, and the corneal incision was checked and found to be water tight. The  eyelid speculum was removed.  The operative eye was covered with protective goggles after instilling 1 drop of timolol and brimonidine. The patient tolerated the procedure well. There were no complications.

## 2017-01-02 ENCOUNTER — Encounter: Payer: Self-pay | Admitting: Ophthalmology

## 2017-01-08 ENCOUNTER — Ambulatory Visit
Admission: RE | Admit: 2017-01-08 | Discharge: 2017-01-08 | Disposition: A | Discharge status: Home / Self Care | Payer: Medicare Other | Source: Ambulatory Visit | Attending: Family Medicine | Admitting: Family Medicine

## 2017-01-08 DIAGNOSIS — Z1231 Encounter for screening mammogram for malignant neoplasm of breast: Secondary | ICD-10-CM | POA: Insufficient documentation

## 2017-01-16 DIAGNOSIS — Z91041 Radiographic dye allergy status: Secondary | ICD-10-CM | POA: Diagnosis not present

## 2017-01-16 DIAGNOSIS — I110 Hypertensive heart disease with heart failure: Secondary | ICD-10-CM | POA: Diagnosis not present

## 2017-01-16 DIAGNOSIS — E119 Type 2 diabetes mellitus without complications: Secondary | ICD-10-CM | POA: Diagnosis not present

## 2017-01-16 DIAGNOSIS — L905 Scar conditions and fibrosis of skin: Secondary | ICD-10-CM | POA: Diagnosis not present

## 2017-01-16 DIAGNOSIS — Z79899 Other long term (current) drug therapy: Secondary | ICD-10-CM | POA: Diagnosis not present

## 2017-01-16 DIAGNOSIS — I209 Angina pectoris, unspecified: Secondary | ICD-10-CM | POA: Diagnosis not present

## 2017-01-16 DIAGNOSIS — Z95810 Presence of automatic (implantable) cardiac defibrillator: Secondary | ICD-10-CM | POA: Diagnosis not present

## 2017-01-16 DIAGNOSIS — I6529 Occlusion and stenosis of unspecified carotid artery: Secondary | ICD-10-CM | POA: Diagnosis not present

## 2017-01-16 DIAGNOSIS — I8393 Asymptomatic varicose veins of bilateral lower extremities: Secondary | ICD-10-CM | POA: Diagnosis not present

## 2017-01-16 DIAGNOSIS — M2100 Valgus deformity, not elsewhere classified, unspecified site: Secondary | ICD-10-CM | POA: Diagnosis not present

## 2017-01-16 DIAGNOSIS — E7800 Pure hypercholesterolemia, unspecified: Secondary | ICD-10-CM | POA: Diagnosis not present

## 2017-01-16 DIAGNOSIS — G43909 Migraine, unspecified, not intractable, without status migrainosus: Secondary | ICD-10-CM | POA: Diagnosis not present

## 2017-01-16 DIAGNOSIS — R0609 Other forms of dyspnea: Secondary | ICD-10-CM | POA: Diagnosis not present

## 2017-01-16 DIAGNOSIS — Z880 Allergy status to penicillin: Secondary | ICD-10-CM | POA: Diagnosis not present

## 2017-01-16 DIAGNOSIS — Z7982 Long term (current) use of aspirin: Secondary | ICD-10-CM | POA: Diagnosis not present

## 2017-01-16 DIAGNOSIS — Z87891 Personal history of nicotine dependence: Secondary | ICD-10-CM | POA: Diagnosis not present

## 2017-01-16 DIAGNOSIS — I5022 Chronic systolic (congestive) heart failure: Secondary | ICD-10-CM | POA: Diagnosis not present

## 2017-01-16 DIAGNOSIS — I6523 Occlusion and stenosis of bilateral carotid arteries: Secondary | ICD-10-CM | POA: Diagnosis not present

## 2017-01-16 DIAGNOSIS — I429 Cardiomyopathy, unspecified: Secondary | ICD-10-CM | POA: Diagnosis not present

## 2017-01-16 DIAGNOSIS — Z888 Allergy status to other drugs, medicaments and biological substances status: Secondary | ICD-10-CM | POA: Diagnosis not present

## 2017-01-16 DIAGNOSIS — I447 Left bundle-branch block, unspecified: Secondary | ICD-10-CM | POA: Diagnosis not present

## 2017-01-16 DIAGNOSIS — I442 Atrioventricular block, complete: Secondary | ICD-10-CM | POA: Diagnosis not present

## 2017-01-16 DIAGNOSIS — M1991 Primary osteoarthritis, unspecified site: Secondary | ICD-10-CM | POA: Diagnosis not present

## 2017-01-16 DIAGNOSIS — K219 Gastro-esophageal reflux disease without esophagitis: Secondary | ICD-10-CM | POA: Diagnosis not present

## 2017-01-22 ENCOUNTER — Ambulatory Visit: Payer: Medicare Other | Admitting: Family Medicine

## 2017-01-25 DIAGNOSIS — I5022 Chronic systolic (congestive) heart failure: Secondary | ICD-10-CM | POA: Diagnosis not present

## 2017-01-25 DIAGNOSIS — I10 Essential (primary) hypertension: Secondary | ICD-10-CM | POA: Diagnosis not present

## 2017-01-25 DIAGNOSIS — E782 Mixed hyperlipidemia: Secondary | ICD-10-CM | POA: Diagnosis not present

## 2017-02-13 DIAGNOSIS — H547 Unspecified visual loss: Secondary | ICD-10-CM | POA: Diagnosis not present

## 2017-02-13 DIAGNOSIS — M47812 Spondylosis without myelopathy or radiculopathy, cervical region: Secondary | ICD-10-CM | POA: Diagnosis not present

## 2017-02-13 DIAGNOSIS — E785 Hyperlipidemia, unspecified: Secondary | ICD-10-CM | POA: Diagnosis not present

## 2017-02-13 DIAGNOSIS — I447 Left bundle-branch block, unspecified: Secondary | ICD-10-CM | POA: Diagnosis not present

## 2017-02-13 DIAGNOSIS — I6523 Occlusion and stenosis of bilateral carotid arteries: Secondary | ICD-10-CM | POA: Diagnosis not present

## 2017-02-13 DIAGNOSIS — Z7982 Long term (current) use of aspirin: Secondary | ICD-10-CM | POA: Diagnosis not present

## 2017-02-13 DIAGNOSIS — M4802 Spinal stenosis, cervical region: Secondary | ICD-10-CM | POA: Diagnosis not present

## 2017-02-13 DIAGNOSIS — M5010 Cervical disc disorder with radiculopathy, unspecified cervical region: Secondary | ICD-10-CM | POA: Diagnosis not present

## 2017-02-13 DIAGNOSIS — I10 Essential (primary) hypertension: Secondary | ICD-10-CM | POA: Diagnosis not present

## 2017-02-13 DIAGNOSIS — I6529 Occlusion and stenosis of unspecified carotid artery: Secondary | ICD-10-CM | POA: Diagnosis not present

## 2017-02-13 DIAGNOSIS — Z87891 Personal history of nicotine dependence: Secondary | ICD-10-CM | POA: Diagnosis not present

## 2017-02-13 DIAGNOSIS — G43909 Migraine, unspecified, not intractable, without status migrainosus: Secondary | ICD-10-CM | POA: Diagnosis not present

## 2017-02-13 DIAGNOSIS — Z95810 Presence of automatic (implantable) cardiac defibrillator: Secondary | ICD-10-CM | POA: Diagnosis not present

## 2017-02-13 DIAGNOSIS — Z79899 Other long term (current) drug therapy: Secondary | ICD-10-CM | POA: Diagnosis not present

## 2017-02-13 DIAGNOSIS — Z136 Encounter for screening for cardiovascular disorders: Secondary | ICD-10-CM | POA: Diagnosis not present

## 2017-02-20 ENCOUNTER — Ambulatory Visit (INDEPENDENT_AMBULATORY_CARE_PROVIDER_SITE_OTHER): Payer: Medicare Other | Admitting: Family Medicine

## 2017-02-20 ENCOUNTER — Encounter: Payer: Self-pay | Admitting: Family Medicine

## 2017-02-20 VITALS — BP 128/72 | HR 62 | Temp 97.4°F | Resp 16 | Wt 250.0 lb

## 2017-02-20 DIAGNOSIS — E119 Type 2 diabetes mellitus without complications: Secondary | ICD-10-CM | POA: Diagnosis not present

## 2017-02-20 DIAGNOSIS — I10 Essential (primary) hypertension: Secondary | ICD-10-CM | POA: Diagnosis not present

## 2017-02-20 LAB — POCT GLYCOSYLATED HEMOGLOBIN (HGB A1C)
Est. average glucose Bld gHb Est-mCnc: 128
Hemoglobin A1C: 6.1

## 2017-02-20 NOTE — Progress Notes (Signed)
Patient: Anna Marsh Female    DOB: [birth year]   71 y.o.   MRN: [id number] Visit Date: 02/20/2017  Today's Provider: Wilhemena Durie, MD   Chief Complaint  Patient presents with  . Diabetes  . Hypertension   Subjective:    HPI  Diabetes Mellitus Type II, Follow-up:   Lab Results  Component Value Date   HGBA1C 6.1 02/20/2017   HGBA1C 5.8 (H) 11/08/2016   HGBA1C 5.8 (H) 04/06/2016    Last seen for diabetes 3 months ago.  Management since then includes no changes. She reports good compliance with treatment. She is not having side effects.  Current symptoms include none and have been stable. Home blood sugar records: not being checked   Episodes of hypoglycemia? no   Current Insulin Regimen: none Most Recent Eye Exam: 11/2016. Weight trend: stable Prior visit with dietician: no Current diet: well balanced Current exercise: no regular exercise  Pertinent Labs:    Component Value Date/Time   CHOL 152 11/08/2016 0610   CHOL 183 04/06/2016 0813   TRIG 94 11/08/2016 0610   HDL 47 11/08/2016 0610   HDL 52 04/06/2016 0813   LDLCALC 86 11/08/2016 0610   LDLCALC 108 (H) 04/06/2016 0813   CREATININE 0.95 12/05/2016 1152   CREATININE 1.10 09/29/2014 1128    Wt Readings from Last 3 Encounters:  02/20/17 250 lb (113.4 kg)  01/01/17 250 lb (113.4 kg)  12/26/16 253 lb (114.8 kg)     Hypertension, follow-up:  BP Readings from Last 3 Encounters:  02/20/17 128/72  01/01/17 (!) 127/56  12/26/16 (!) 104/52    She was last seen for hypertension 3 months ago.  BP at that visit was 104/52. Management since that visit includes no changes. She reports good compliance with treatment. She is not having side effects.  She is not exercising. She is adherent to low salt diet.   Outside blood pressures are checked occasionally. Patient denies exertional chest pressure/discomfort and palpitations.   Cardiovascular risk factors include diabetes mellitus.      Weight trend: stable Wt Readings from Last 3 Encounters:  02/20/17 250 lb (113.4 kg)  01/01/17 250 lb (113.4 kg)  12/26/16 253 lb (114.8 kg)    Current diet: well balanced         Allergies  Allergen Reactions  . Iodinated Diagnostic Agents Other (See Comments)    Causes migraines  . Iodine     Contrast Dye causes migraines  . Lisinopril Cough  . Penicillins Rash     Current Outpatient Prescriptions:  .  aspirin EC 325 MG tablet, Take 1 tablet (325 mg total) by mouth daily., Disp: 30 tablet, Rfl: 0 .  atorvastatin (LIPITOR) 80 MG tablet, Take 1 tablet by mouth  daily, Disp: 90 tablet, Rfl: 3 .  cyanocobalamin 1000 MCG tablet, Take by mouth., Disp: , Rfl:  .  ENTRESTO 24-26 MG, , Disp: , Rfl:  .  furosemide (LASIX) 40 MG tablet, Take 1 tablet (40 mg total) by mouth daily., Disp: 90 tablet, Rfl: 3 .  gabapentin (NEURONTIN) 100 MG capsule, Take 100 mg by mouth at bedtime., Disp: , Rfl:  .  hydrochlorothiazide (HYDRODIURIL) 25 MG tablet, , Disp: , Rfl:  .  loratadine (CLARITIN) 10 MG tablet, Take by mouth., Disp: , Rfl:  .  metoprolol succinate (TOPROL-XL) 25 MG 24 hr tablet, Take 1 tablet (25 mg total) by mouth daily., Disp: 90 tablet, Rfl: 3 .  MULTIPLE VITAMIN PO,  Take by mouth., Disp: , Rfl:  .  nitroGLYCERIN (NITROSTAT) 0.4 MG SL tablet, Place 1 tablet (0.4 mg total) under the tongue every 5 (five) minutes as needed for chest pain., Disp: 30 tablet, Rfl: 3 .  omeprazole (PRILOSEC) 20 MG capsule, , Disp: , Rfl:  .  potassium chloride (K-DUR) 10 MEQ tablet, Take 1 tablet by mouth two  times daily, Disp: 180 tablet, Rfl: 3 .  sertraline (ZOLOFT) 50 MG tablet, Take 1 tablet by mouth  daily, Disp: 90 tablet, Rfl: 3  Review of Systems  Constitutional: Negative.   Respiratory: Negative.   Cardiovascular: Negative.   Endocrine: Negative.   Musculoskeletal: Negative.   Allergic/Immunologic: Negative.   Neurological: Negative.   Hematological: Negative.    Psychiatric/Behavioral: Negative.     Social History  Substance Use Topics  . Smoking status: Former Smoker    Packs/day: 0.50    Years: 20.00    Quit date: 10/03/2003  . Smokeless tobacco: Never Used  . Alcohol use No   Objective:   BP 128/72 (BP Location: Right Arm, Patient Position: Sitting, Cuff Size: Large)   Pulse 62   Temp 97.4 F (36.3 C)   Resp 16   Wt 250 lb (113.4 kg)   SpO2 94%   BMI 44.29 kg/m  Vitals:   02/20/17 1122  BP: 128/72  Pulse: 62  Resp: 16  Temp: 97.4 F (36.3 C)  SpO2: 94%  Weight: 250 lb (113.4 kg)     Physical Exam  Constitutional: She is oriented to person, place, and time. She appears well-developed and well-nourished.  HENT:  Head: Normocephalic and atraumatic.  Eyes: Conjunctivae are normal. No scleral icterus.  Neck: No thyromegaly present.  Cardiovascular: Normal rate, regular rhythm and normal heart sounds.   Pulmonary/Chest: Effort normal and breath sounds normal.  Musculoskeletal: She exhibits no edema.  Neurological: She is alert and oriented to person, place, and time.  Skin: Skin is warm and dry.  Psychiatric: She has a normal mood and affect. Her behavior is normal. Judgment and thought content normal.        Assessment & Plan:     1. Essential hypertension   2. Type 2 diabetes mellitus without complication, without long-term current use of insulin (HCC)  - POCT glycosylated hemoglobin (Hb A1C)--6.1 today. 3.Chronic Systolic CHF 4.pacemaker in place      I have done the exam and reviewed the above chart and it is accurate to the best of my knowledge. Development worker, community has been used in this note in any air is in the dictation or transcription are unintentional.  Wilhemena Durie, MD  [place]

## 2017-02-27 DIAGNOSIS — I255 Ischemic cardiomyopathy: Secondary | ICD-10-CM | POA: Diagnosis not present

## 2017-02-27 DIAGNOSIS — Z4502 Encounter for adjustment and management of automatic implantable cardiac defibrillator: Secondary | ICD-10-CM | POA: Diagnosis not present

## 2017-03-29 ENCOUNTER — Ambulatory Visit: Payer: Medicare Other | Admitting: Family Medicine

## 2017-04-20 ENCOUNTER — Other Ambulatory Visit: Payer: Self-pay | Admitting: Family Medicine

## 2017-04-20 DIAGNOSIS — I10 Essential (primary) hypertension: Secondary | ICD-10-CM

## 2017-05-29 ENCOUNTER — Ambulatory Visit
Admission: RE | Admit: 2017-05-29 | Discharge: 2017-05-29 | Disposition: A | Discharge status: Home / Self Care | Payer: MEDICARE

## 2017-05-29 DIAGNOSIS — I429 Cardiomyopathy, unspecified: Principal | ICD-10-CM

## 2017-05-29 DIAGNOSIS — Z4502 Encounter for adjustment and management of automatic implantable cardiac defibrillator: Secondary | ICD-10-CM | POA: Diagnosis not present

## 2017-06-28 ENCOUNTER — Encounter: Payer: Self-pay | Admitting: Family Medicine

## 2017-06-28 ENCOUNTER — Ambulatory Visit (INDEPENDENT_AMBULATORY_CARE_PROVIDER_SITE_OTHER): Payer: Medicare Other | Admitting: Family Medicine

## 2017-06-28 VITALS — BP 80/48 | HR 68 | Temp 98.5°F | Resp 18 | Wt 253.0 lb

## 2017-06-28 DIAGNOSIS — Z23 Encounter for immunization: Secondary | ICD-10-CM | POA: Diagnosis not present

## 2017-06-28 DIAGNOSIS — I952 Hypotension due to drugs: Secondary | ICD-10-CM | POA: Diagnosis not present

## 2017-06-28 DIAGNOSIS — I10 Essential (primary) hypertension: Secondary | ICD-10-CM

## 2017-06-28 DIAGNOSIS — R7303 Prediabetes: Secondary | ICD-10-CM

## 2017-06-28 DIAGNOSIS — I5022 Chronic systolic (congestive) heart failure: Secondary | ICD-10-CM | POA: Diagnosis not present

## 2017-06-28 LAB — POCT GLYCOSYLATED HEMOGLOBIN (HGB A1C)
Est. average glucose Bld gHb Est-mCnc: 123
HEMOGLOBIN A1C: 5.9

## 2017-06-28 NOTE — Patient Instructions (Signed)
Stop HCTZ and follow up in 1 month for blood pressure check.

## 2017-06-28 NOTE — Progress Notes (Signed)
Patient: Anna Marsh Female    DOB: 01-30-1946   71 y.o.   MRN: [id number] Visit Date: 06/28/2017  Today's Provider: Wilhemena Durie, MD   Chief Complaint  Patient presents with  . Hyperglycemia    follow up  . Hypertension    follow up   Subjective:    HPI  Prediabetes, Follow-up:   Lab Results  Component Value Date   HGBA1C 6.1 02/20/2017   HGBA1C 5.8 (H) 11/08/2016   HGBA1C 5.8 (H) 04/06/2016   GLUCOSE 93 12/05/2016   GLUCOSE 104 (H) 11/07/2016   GLUCOSE 105 (H) 04/06/2016    Last seen for for this 4 months ago.  Management since that visit includes no changes. Current symptoms include none and have been stable.  Weight trend: fluctuating a bit Prior visit with dietician: no Current diet: well balanced Current exercise: none  Pertinent Labs:    Component Value Date/Time   CHOL 152 11/08/2016 0610   CHOL 183 04/06/2016 0813   TRIG 94 11/08/2016 0610   CHOLHDL 3.2 11/08/2016 0610   CREATININE 0.95 12/05/2016 1152   CREATININE 1.10 09/29/2014 1128    Wt Readings from Last 3 Encounters:  02/20/17 250 lb (113.4 kg)  01/01/17 250 lb (113.4 kg)  12/26/16 253 lb (114.8 kg)    Hypertension, follow-up:  BP Readings from Last 3 Encounters:  02/20/17 128/72  01/01/17 (!) 127/56  12/26/16 (!) 104/52    She was last seen for hypertension 4 months ago.  BP at that visit was 128/72. Management since that visit includes no changes. She reports good compliance with treatment. She is not having side effects.  She is not exercising. She is adherent to low salt diet.   Outside blood pressures are not being checked. She is experiencing none.  Patient denies chest pain, chest pressure/discomfort, claudication, dyspnea, exertional chest pressure/discomfort, fatigue, irregular heart beat, lower extremity edema, near-syncope, orthopnea, palpitations, paroxysmal nocturnal dyspnea, syncope and tachypnea.   Cardiovascular risk factors include advanced  age (older than 62 for men, 39 for women) and hypertension.  Use of agents associated with hypertension: NSAIDS.     Weight trend: fluctuating a bit Wt Readings from Last 3 Encounters:  02/20/17 250 lb (113.4 kg)  01/01/17 250 lb (113.4 kg)  12/26/16 253 lb (114.8 kg)    Current diet: well balanced  ------------------------------------------------------------------------ Follow up of CHF:   Patient was last seen for this problem 4 months ago and no changes were made. Patient states she follows up with her Cardiologist every 6 months (Dr. Ubaldo Glassing).    Allergies  Allergen Reactions  . Iodinated Diagnostic Agents Other (See Comments)    Causes migraines  . Iodine     Contrast Dye causes migraines  . Lisinopril Cough  . Penicillins Rash     Current Outpatient Prescriptions:  .  aspirin EC 325 MG tablet, Take 1 tablet (325 mg total) by mouth daily., Disp: 30 tablet, Rfl: 0 .  atorvastatin (LIPITOR) 80 MG tablet, Take 1 tablet by mouth  daily, Disp: 90 tablet, Rfl: 3 .  cyanocobalamin 1000 MCG tablet, Take by mouth., Disp: , Rfl:  .  ENTRESTO 24-26 MG, , Disp: , Rfl:  .  furosemide (LASIX) 40 MG tablet, Take 1 tablet (40 mg total) by mouth daily., Disp: 90 tablet, Rfl: 3 .  gabapentin (NEURONTIN) 100 MG capsule, Take 100 mg by mouth at bedtime., Disp: , Rfl:  .  hydrochlorothiazide (HYDRODIURIL) 25 MG tablet, TAKE  1 TABLET BY MOUTH  DAILY, Disp: 90 tablet, Rfl: 3 .  loratadine (CLARITIN) 10 MG tablet, Take by mouth., Disp: , Rfl:  .  metoprolol succinate (TOPROL-XL) 25 MG 24 hr tablet, TAKE 1 TABLET BY MOUTH  DAILY, Disp: 90 tablet, Rfl: 3 .  MULTIPLE VITAMIN PO, Take by mouth., Disp: , Rfl:  .  nitroGLYCERIN (NITROSTAT) 0.4 MG SL tablet, Place 1 tablet (0.4 mg total) under the tongue every 5 (five) minutes as needed for chest pain., Disp: 30 tablet, Rfl: 3 .  omeprazole (PRILOSEC) 20 MG capsule, TAKE 1 CAPSULE BY MOUTH  DAILY, Disp: 90 capsule, Rfl: 3 .  potassium chloride  (K-DUR,KLOR-CON) 10 MEQ tablet, TAKE 1 TABLET BY MOUTH TWO  TIMES DAILY, Disp: 180 tablet, Rfl: 3 .  sertraline (ZOLOFT) 50 MG tablet, TAKE 1 TABLET BY MOUTH  DAILY, Disp: 90 tablet, Rfl: 3  Review of Systems  Constitutional: Negative for appetite change, chills, fatigue and fever.  HENT: Negative.   Eyes: Negative.   Respiratory: Negative for chest tightness and shortness of breath.   Cardiovascular: Negative for chest pain and palpitations.  Gastrointestinal: Negative for abdominal pain, nausea and vomiting.  Endocrine: Negative.   Musculoskeletal: Positive for arthralgias.  Allergic/Immunologic: Negative.   Neurological: Negative for dizziness and weakness.  Hematological: Negative.   Psychiatric/Behavioral: Negative.     Social History  Substance Use Topics  . Smoking status: Former Smoker    Packs/day: 0.50    Years: 20.00    Quit date: 10/03/2003  . Smokeless tobacco: Never Used  . Alcohol use No   Objective:   BP (!) 80/48 (BP Location: Right Arm, Cuff Size: Large)   Pulse 68   Temp 98.5 F (36.9 C) (Oral)   Resp 18   Wt 253 lb (114.8 kg)   SpO2 98% Comment: room air  BMI 44.82 kg/m  Vitals:   06/28/17 1337 06/28/17 1340  BP: (!) 82/50 (!) 80/48  Pulse: 68   Resp: 18   Temp: 98.5 F (36.9 C)   TempSrc: Oral   SpO2: 98%   Weight: 253 lb (114.8 kg)    This SmartLink has not been configured with any valid records.   Lab Results  Component Value Date   HGBA1C 5.9 06/28/2017    Physical Exam  Constitutional: She is oriented to person, place, and time. She appears well-developed and well-nourished.  Pleasant, cooperative , obese white female in no acute distress  HENT:  Head: Normocephalic and atraumatic.  Right Ear: External ear normal.  Left Ear: External ear normal.  Nose: Nose normal.  Eyes: Conjunctivae are normal.  Neck: No thyromegaly present.  Cardiovascular: Normal rate, regular rhythm and normal heart sounds.   Pulmonary/Chest: Effort normal  and breath sounds normal.  Abdominal: Soft.  Musculoskeletal: She exhibits no edema.  Neurological: She is alert and oriented to person, place, and time.  Skin: Skin is warm and dry.  Psychiatric: She has a normal mood and affect. Her behavior is normal. Judgment and thought content normal.        Assessment & Plan:      1. Pre-diabetes Stable. A1C 5.9 today in the office. Follow-up in the future just with routine fasting sugars. - POCT HgB A1C  2. Essential hypertension -Blood pressure running low. Will stop HCTZ and follow up 1 month for recheck.  3. Chronic systolic heart failure ([place]) -Pacemaker in place  4. Need for influenza vaccination - Flu vaccine HIGH DOSE PF (Fluzone High dose)  5. Hypotension due to medication -New. Will stop HCTZ and follow up in 1 month.   I have done the exam and reviewed the above chart and it is accurate to the best of my knowledge. Development worker, community has been used in this note in any air is in the dictation or transcription are unintentional. I have done the exam and reviewed the chart and it is accurate to the best of my knowledge. Development worker, community has been used and  any errors in dictation or transcription are unintentional. Miguel Aschoff M.D. [place], MD  [facility]

## 2017-07-10 ENCOUNTER — Telehealth: Payer: Self-pay | Admitting: Family Medicine

## 2017-07-10 NOTE — Telephone Encounter (Signed)
Cut back on Metoprolol to 12.5 daily.

## 2017-07-10 NOTE — Telephone Encounter (Signed)
Please review, she stopped taking medication on 06/28/17-Anastasiya V Hopkins, RMA

## 2017-07-10 NOTE — Telephone Encounter (Signed)
Pt stated that Dr. Rosanna Randy advised her to stop taking hydrochlorothiazide (HYDRODIURIL) 25 MG tablet and the last day she took the medication was on 07/28/17. Pt stated that since stopping the medication she has noticed her feet are swelling and she thinks it's because she isn't on that medication. Pt is requesting if she can take the medication again or what should she try. Please advise. Thanks TNP

## 2017-07-11 NOTE — Telephone Encounter (Signed)
Pt advised-Anna Marsh, RMA  

## 2017-07-24 ENCOUNTER — Telehealth: Payer: Self-pay | Admitting: Family Medicine

## 2017-07-24 DIAGNOSIS — I10 Essential (primary) hypertension: Secondary | ICD-10-CM | POA: Diagnosis not present

## 2017-07-24 DIAGNOSIS — I5022 Chronic systolic (congestive) heart failure: Secondary | ICD-10-CM | POA: Diagnosis not present

## 2017-07-24 DIAGNOSIS — I209 Angina pectoris, unspecified: Secondary | ICD-10-CM | POA: Diagnosis not present

## 2017-07-24 DIAGNOSIS — Z9889 Other specified postprocedural states: Secondary | ICD-10-CM | POA: Diagnosis not present

## 2017-07-24 DIAGNOSIS — E782 Mixed hyperlipidemia: Secondary | ICD-10-CM | POA: Diagnosis not present

## 2017-07-24 NOTE — Telephone Encounter (Signed)
Pt stated she received a letter to report for jury duty. Pt is requesting Dr. Rosanna Randy write her a letter so she won't have to go to jury duty because she needs to be with her husband for dialysis. Pt has to take him and pick him up from dialysis and also, needs to be with him as it is difficult for him to care for himself. Pt is scheduled for jury duty on 08/09/17. Pt request that her daughter [name] pick up the letter.  Please advise. Thanks TNP

## 2017-07-24 NOTE — Telephone Encounter (Signed)
ok 

## 2017-07-24 NOTE — Telephone Encounter (Signed)
Please review-Anna Marsh, RMA  

## 2017-07-26 ENCOUNTER — Other Ambulatory Visit: Payer: Self-pay | Admitting: Physician Assistant

## 2017-07-26 ENCOUNTER — Other Ambulatory Visit: Payer: Self-pay | Admitting: Family Medicine

## 2017-07-26 DIAGNOSIS — I10 Essential (primary) hypertension: Secondary | ICD-10-CM

## 2017-07-26 DIAGNOSIS — E7800 Pure hypercholesterolemia, unspecified: Secondary | ICD-10-CM

## 2017-07-30 ENCOUNTER — Encounter: Payer: Self-pay | Admitting: Family Medicine

## 2017-07-30 ENCOUNTER — Ambulatory Visit (INDEPENDENT_AMBULATORY_CARE_PROVIDER_SITE_OTHER): Payer: Medicare Other | Admitting: Family Medicine

## 2017-07-30 VITALS — BP 130/70 | HR 63 | Temp 97.9°F | Resp 16 | Wt 257.0 lb

## 2017-07-30 DIAGNOSIS — I10 Essential (primary) hypertension: Secondary | ICD-10-CM

## 2017-07-30 DIAGNOSIS — I442 Atrioventricular block, complete: Secondary | ICD-10-CM

## 2017-07-30 DIAGNOSIS — I5022 Chronic systolic (congestive) heart failure: Secondary | ICD-10-CM

## 2017-07-30 NOTE — Progress Notes (Signed)
Patient: Anna Marsh Female    DOB: 1945/10/29   71 y.o.   MRN: [id number] Visit Date: 07/30/2017  Today's Provider: Wilhemena Durie, MD   Chief Complaint  Patient presents with  . Hypertension   Subjective:    HPI Anna Marsh is asking when she can decrease ASA back to 81 mg po qd.     Hypertension, follow-up:  BP Readings from Last 3 Encounters:  07/30/17 130/70  06/28/17 (!) 80/48  02/20/17 128/72    She was last seen for hypertension 4 weeks ago.  BP at that visit was 80/48. Management since that visit includes D/C HCTZ due to hypotension. She reports good compliance with treatment. She is not having side effects.  She is exercising. Walking some. She is adherent to low salt diet.   Outside blood pressures are not being checked. She is experiencing none.  Patient denies chest pain, chest pressure/discomfort, claudication, dyspnea, exertional chest pressure/discomfort, fatigue, irregular heart beat, lower extremity edema, near-syncope, orthopnea, palpitations and syncope.   Cardiovascular risk factors include advanced age (older than 70 for men, 46 for women), hypertension and obesity (BMI >= 30 kg/m2).     Weight trend: increasing steadily; pt states her weight is stable at home. Her insurance company is monitoring weight and calls pt if she gains more than 3 pounds in a day. Wt Readings from Last 3 Encounters:  07/30/17 257 lb (116.6 kg)  06/28/17 253 lb (114.8 kg)  02/20/17 250 lb (113.4 kg)    Current diet: in general, a "healthy" diet    ------------------------------------------------------------------------   Allergies  Allergen Reactions  . Iodinated Diagnostic Agents Other (See Comments)    Causes migraines  . Iodine     Contrast Dye causes migraines  . Lisinopril Cough  . Penicillins Rash     Current Outpatient Prescriptions:  .  aspirin EC 325 MG tablet, Take 1 tablet (325 mg total) by mouth daily., Disp: 30 tablet, Rfl: 0 .   atorvastatin (LIPITOR) 80 MG tablet, TAKE 1 TABLET BY MOUTH  DAILY, Disp: 90 tablet, Rfl: 3 .  cyanocobalamin 1000 MCG tablet, Take by mouth., Disp: , Rfl:  .  ENTRESTO 24-26 MG, , Disp: , Rfl:  .  furosemide (LASIX) 40 MG tablet, TAKE 1 TABLET BY MOUTH  DAILY, Disp: 90 tablet, Rfl: 3 .  gabapentin (NEURONTIN) 100 MG capsule, Take 100 mg by mouth at bedtime., Disp: , Rfl:  .  loratadine (CLARITIN) 10 MG tablet, Take by mouth., Disp: , Rfl:  .  metoprolol succinate (TOPROL-XL) 25 MG 24 hr tablet, TAKE 1 TABLET BY MOUTH  DAILY, Disp: 90 tablet, Rfl: 3 .  MULTIPLE VITAMIN PO, Take by mouth., Disp: , Rfl:  .  nitroGLYCERIN (NITROSTAT) 0.4 MG SL tablet, Place 1 tablet (0.4 mg total) under the tongue every 5 (five) minutes as needed for chest pain., Disp: 30 tablet, Rfl: 3 .  omeprazole (PRILOSEC) 20 MG capsule, TAKE 1 CAPSULE BY MOUTH  DAILY, Disp: 90 capsule, Rfl: 3 .  potassium chloride (K-DUR,KLOR-CON) 10 MEQ tablet, TAKE 1 TABLET BY MOUTH TWO  TIMES DAILY, Disp: 180 tablet, Rfl: 3 .  sertraline (ZOLOFT) 50 MG tablet, TAKE 1 TABLET BY MOUTH  DAILY, Disp: 90 tablet, Rfl: 3  Review of Systems  Constitutional: Negative for activity change, appetite change, chills, diaphoresis, fatigue, fever and unexpected weight change.  HENT: Negative.   Eyes: Negative.   Respiratory: Negative.   Cardiovascular: Negative for chest pain,  palpitations and leg swelling.  Gastrointestinal: Negative.   Endocrine: Negative.   Allergic/Immunologic: Negative.   Hematological: Negative.   Psychiatric/Behavioral: Negative.     Social History  Substance Use Topics  . Smoking status: Former Smoker    Packs/day: 0.50    Years: 20.00    Quit date: 10/03/2003  . Smokeless tobacco: Never Used  . Alcohol use No   Objective:   BP 130/70 (BP Location: Right Arm, Patient Position: Sitting, Cuff Size: Large)   Pulse 63   Temp 97.9 F (36.6 C) (Oral)   Resp 16   Wt 257 lb (116.6 kg)   SpO2 97%   BMI 45.53 kg/m    Vitals:   07/30/17 1516  BP: 130/70  Pulse: 63  Resp: 16  Temp: 97.9 F (36.6 C)  TempSrc: Oral  SpO2: 97%  Weight: 257 lb (116.6 kg)     Physical Exam  Constitutional: She is oriented to person, place, and time. She appears well-developed and well-nourished.  HENT:  Head: Normocephalic and atraumatic.  Eyes: Conjunctivae are normal. No scleral icterus.  Neck: No thyromegaly present.  Cardiovascular: Normal rate, regular rhythm and normal heart sounds.   Pulmonary/Chest: Effort normal and breath sounds normal.  Abdominal: Soft.  Neurological: She is alert and oriented to person, place, and time.  Skin: Skin is warm and dry.  Psychiatric: She has a normal mood and affect. Her behavior is normal. Judgment and thought content normal.        Assessment & Plan:     HTN Idiopathic Cardiomyopathy Pacemaker Chronic CHF Ok on [place].     I have done the exam and reviewed the above chart and it is accurate to the best of my knowledge. Development worker, community has been used in this note in any air is in the dictation or transcription are unintentional.  Wilhemena Durie, MD  [place]

## 2017-08-14 ENCOUNTER — Ambulatory Visit
Admission: RE | Admit: 2017-08-14 | Discharge: 2017-08-14 | Disposition: A | Discharge status: Home / Self Care | Payer: MEDICARE

## 2017-08-14 ENCOUNTER — Ambulatory Visit
Admission: RE | Admit: 2017-08-14 | Discharge: 2017-08-14 | Disposition: A | Discharge status: Home / Self Care | Payer: MEDICARE | Attending: Vascular Surgery | Admitting: Vascular Surgery

## 2017-08-14 DIAGNOSIS — I6523 Occlusion and stenosis of bilateral carotid arteries: Principal | ICD-10-CM

## 2017-08-14 DIAGNOSIS — I447 Left bundle-branch block, unspecified: Secondary | ICD-10-CM | POA: Diagnosis not present

## 2017-08-14 DIAGNOSIS — G43909 Migraine, unspecified, not intractable, without status migrainosus: Secondary | ICD-10-CM | POA: Diagnosis not present

## 2017-08-14 DIAGNOSIS — Z87891 Personal history of nicotine dependence: Secondary | ICD-10-CM | POA: Diagnosis not present

## 2017-08-14 DIAGNOSIS — Z95810 Presence of automatic (implantable) cardiac defibrillator: Secondary | ICD-10-CM | POA: Diagnosis not present

## 2017-08-14 DIAGNOSIS — I5022 Chronic systolic (congestive) heart failure: Secondary | ICD-10-CM | POA: Diagnosis not present

## 2017-08-14 DIAGNOSIS — I110 Hypertensive heart disease with heart failure: Secondary | ICD-10-CM | POA: Diagnosis not present

## 2017-08-14 DIAGNOSIS — Z79899 Other long term (current) drug therapy: Secondary | ICD-10-CM | POA: Diagnosis not present

## 2017-08-14 DIAGNOSIS — R200 Anesthesia of skin: Secondary | ICD-10-CM | POA: Diagnosis not present

## 2017-08-14 DIAGNOSIS — Z7982 Long term (current) use of aspirin: Secondary | ICD-10-CM | POA: Diagnosis not present

## 2017-08-14 DIAGNOSIS — E785 Hyperlipidemia, unspecified: Secondary | ICD-10-CM | POA: Diagnosis not present

## 2017-08-25 ENCOUNTER — Other Ambulatory Visit: Payer: Self-pay

## 2017-08-25 DIAGNOSIS — R112 Nausea with vomiting, unspecified: Secondary | ICD-10-CM | POA: Insufficient documentation

## 2017-08-25 DIAGNOSIS — I110 Hypertensive heart disease with heart failure: Secondary | ICD-10-CM | POA: Insufficient documentation

## 2017-08-25 DIAGNOSIS — Z87891 Personal history of nicotine dependence: Secondary | ICD-10-CM | POA: Insufficient documentation

## 2017-08-25 DIAGNOSIS — Z7982 Long term (current) use of aspirin: Secondary | ICD-10-CM | POA: Diagnosis not present

## 2017-08-25 DIAGNOSIS — R51 Headache: Secondary | ICD-10-CM | POA: Diagnosis not present

## 2017-08-25 DIAGNOSIS — Z8673 Personal history of transient ischemic attack (TIA), and cerebral infarction without residual deficits: Secondary | ICD-10-CM | POA: Insufficient documentation

## 2017-08-25 DIAGNOSIS — Z79899 Other long term (current) drug therapy: Secondary | ICD-10-CM | POA: Insufficient documentation

## 2017-08-25 DIAGNOSIS — E860 Dehydration: Secondary | ICD-10-CM | POA: Insufficient documentation

## 2017-08-25 DIAGNOSIS — I509 Heart failure, unspecified: Secondary | ICD-10-CM | POA: Insufficient documentation

## 2017-08-25 LAB — CBC
HCT: 38.5 % (ref 35.0–47.0)
HEMOGLOBIN: 12.8 g/dL (ref 12.0–16.0)
MCH: 28.2 pg (ref 26.0–34.0)
MCHC: 33.1 g/dL (ref 32.0–36.0)
MCV: 84.9 fL (ref 80.0–100.0)
PLATELETS: 219 10*3/uL (ref 150–440)
RBC: 4.53 MIL/uL (ref 3.80–5.20)
RDW: 14.4 % (ref 11.5–14.5)
WBC: 8.2 10*3/uL (ref 3.6–11.0)

## 2017-08-25 LAB — COMPREHENSIVE METABOLIC PANEL
ALBUMIN: 3.9 g/dL (ref 3.5–5.0)
ALK PHOS: 102 U/L (ref 38–126)
ALT: 20 U/L (ref 14–54)
AST: 23 U/L (ref 15–41)
Anion gap: 11 (ref 5–15)
BILIRUBIN TOTAL: 0.6 mg/dL (ref 0.3–1.2)
BUN: 23 mg/dL — AB (ref 6–20)
CALCIUM: 9.5 mg/dL (ref 8.9–10.3)
CO2: 26 mmol/L (ref 22–32)
CREATININE: 1.12 mg/dL — AB (ref 0.44–1.00)
Chloride: 99 mmol/L — ABNORMAL LOW (ref 101–111)
GFR calc Af Amer: 56 mL/min — ABNORMAL LOW (ref >60)
GFR calc non Af Amer: 48 mL/min — ABNORMAL LOW (ref >60)
GLUCOSE: 123 mg/dL — AB (ref 65–99)
Potassium: 3.6 mmol/L (ref 3.5–5.1)
SODIUM: 136 mmol/L (ref 135–145)
TOTAL PROTEIN: 7 g/dL (ref 6.5–8.1)

## 2017-08-25 LAB — LIPASE, BLOOD: Lipase: 39 U/L (ref 11–51)

## 2017-08-25 MED ORDER — OXYCODONE-ACETAMINOPHEN 5-325 MG PO TABS
1.0000 | ORAL_TABLET | ORAL | Status: DC | PRN
  Administered 2017-08-25: 1 via ORAL
  Filled 2017-08-25: qty 1

## 2017-08-25 MED ORDER — ONDANSETRON 4 MG PO TBDP
4.0000 mg | ORAL_TABLET | Freq: Once | ORAL | Status: AC | PRN
  Administered 2017-08-25: 4 mg via ORAL

## 2017-08-25 MED ORDER — ONDANSETRON 4 MG PO TBDP
ORAL_TABLET | ORAL | Status: AC
  Filled 2017-08-25: qty 1

## 2017-08-25 NOTE — ED Triage Notes (Addendum)
Pt presents to ED via POV with c/o headache and nausea since 3pm today. Pt reports taking 2 Ibuprofen at 3pm and 2 Excedrin migraine at 7pm without relief. Pt denies previous h/x of migraines, but states she only has them "once in a blue moon". Pt reports (+) photosensitivity. Pt denies vomiting; no CP, abdominal pain, dizziness or SHOB. Pt reports family h/x of "bad migraines". Pt denies weakness in extremities, no facial droop, no trouble swallowing or speaking. Pt denies slurred speech, but states it hurts to talk. Pt is A&O, in NAD; RR even, regular, and unlabored.

## 2017-08-26 ENCOUNTER — Emergency Department
Admission: EM | Admit: 2017-08-26 | Discharge: 2017-08-26 | Disposition: A | Discharge status: Home / Self Care | Payer: Medicare Other | Attending: Emergency Medicine | Admitting: Emergency Medicine

## 2017-08-26 DIAGNOSIS — R51 Headache: Secondary | ICD-10-CM

## 2017-08-26 DIAGNOSIS — E860 Dehydration: Secondary | ICD-10-CM

## 2017-08-26 DIAGNOSIS — R519 Headache, unspecified: Secondary | ICD-10-CM

## 2017-08-26 MED ORDER — ONDANSETRON 4 MG PO TBDP
4.0000 mg | ORAL_TABLET | Freq: Three times a day (TID) | ORAL | 0 refills | Status: DC | PRN
Start: 2017-08-26 — End: 2018-08-15

## 2017-08-26 MED ORDER — BUTALBITAL-APAP-CAFFEINE 50-325-40 MG PO TABS: 1.0000 | ORAL_TABLET | ORAL | 0 refills | Status: DC | PRN

## 2017-08-26 MED ORDER — BUTALBITAL-APAP-CAFFEINE 50-325-40 MG PO TABS
1.0000 | ORAL_TABLET | Freq: Once | ORAL | Status: AC
  Administered 2017-08-26: 1 via ORAL
  Filled 2017-08-26: qty 1

## 2017-08-26 MED ORDER — ONDANSETRON 4 MG PO TBDP
4.0000 mg | ORAL_TABLET | Freq: Once | ORAL | Status: AC
  Administered 2017-08-26: 4 mg via ORAL
  Filled 2017-08-26: qty 1

## 2017-08-26 NOTE — ED Provider Notes (Signed)
[facility] Emergency Department Provider Note   ____________________________________________   First MD Initiated Contact with Patient 08/26/17 [phone]     (approximate)  I have reviewed the triage vital signs and the nursing notes.   HISTORY  Chief Complaint Headache and Nausea    HPI Anna Marsh is a 71 y.o. female who presents to the ED from home with a chief complaint of headache.  Patient reports occasional headaches.  Reports gradual onset left-sided headache approximately 3 PM when she was "upset with my grandson".  Took 2 ibuprofen without relief approximately 3 PM, then 2 Excedrin Migraine at 7 PM without relief of symptoms.  Headache associated with nausea and photophobia only.  Patient denies associated fever, chills, neck pain, chest pain, shortness of breath, abdominal pain, vomiting.  Denies recent travel or trauma.  Denies associated unilateral extremity weakness, numbness/tingling, slurred speech, facial droop, dysphasia.   Past Medical History:  Diagnosis Date  . AICD (automatic cardioverter/defibrillator) present   . Anxiety   . AV block, complete (HCC)   . Cervical radiculopathy   . CHF (congestive heart failure) ([place])   . Chronic systolic heart failure ([place])   . GERD (gastroesophageal reflux disease)   . History of cardiac arrest   . Hypercholesteremia   . Hypertension   . Pre-diabetes   . 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 Right arm weakness     Patient Active Problem List   Diagnosis Date Noted  . TIA (transient ischemic attack) 11/07/2016  . Pre-diabetes 07/20/2015  . Acute anxiety 07/20/2015  . Cardiomyopathy ([place]) 02/28/2015  . 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 GERD (gastroesophageal reflux disease) 02/28/2015  . Hypercholesterolemia 02/28/2015  . Hypertension 02/28/2015  . Cardiac defibrillator in place 02/28/2015  . Block, bundle branch, left 02/28/2015  .  Adiposity 02/28/2015  . Acid reflux 02/28/2015  . 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 Chronic systolic heart failure ([place]) 02/24/2015  . H/O cardiac catheterization 02/24/2015  . 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 Angina pectoris ([place]) 12/25/2013  . Arthritis 12/25/2013  . 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 ABDOMINAL HYSTERECTOMY    . CATARACT EXTRACTION W/PHACO Left 12/18/2016   Procedure: CATARACT EXTRACTION PHACO AND INTRAOCULAR LENS PLACEMENT ([place])  left;  Surgeon: Ronnell Freshwater, MD;  Location: [facility];  Service: Ophthalmology;  Laterality: Left;  . CATARACT EXTRACTION W/PHACO Right 01/01/2017   Procedure: CATARACT EXTRACTION PHACO AND INTRAOCULAR LENS PLACEMENT ([place])  Right;  Surgeon: Ronnell Freshwater, MD;  Location: [place];  Service: Ophthalmology;  Laterality: Right;  . HERNIA REPAIR     umbilical x 2  . NECK SURGERY    . PACEMAKER IMPLANT  02/03/2015   [facility]  . TONSILLECTOMY      Prior to Admission medications   Medication Sig Start Date End Date Taking? Authorizing Provider  aspirin EC 325 MG tablet Take 1 tablet (325 mg total) by mouth daily. 12/05/16   Jerrol Banana., MD  atorvastatin (LIPITOR) 80 MG tablet TAKE 1 TABLET BY MOUTH  DAILY 07/26/17   Jerrol Banana., MD  butalbital-acetaminophen-caffeine (FIORICET, ESGIC) 2178381073 MG tablet Take 1 tablet by mouth every 4 (four) hours as needed for headache. 08/26/17   Paulette Blanch, MD  cyanocobalamin 1000 MCG tablet Take by mouth.    [provider]  ENTRESTO 24-26 MG  09/13/15   [provider]  furosemide (LASIX) 40 MG tablet TAKE 1 TABLET BY MOUTH  DAILY 07/26/17   Jerrol Banana., MD  gabapentin (NEURONTIN) 100 MG capsule Take 100 mg by mouth at bedtime.    [provider]  loratadine (CLARITIN) 10 MG tablet Take by mouth.    [provider]  metoprolol succinate (TOPROL-XL) 25 MG 24 hr tablet TAKE 1 TABLET BY MOUTH  DAILY 04/20/17   Jerrol Banana., MD  MULTIPLE VITAMIN PO Take by mouth.    [provider]  nitroGLYCERIN (NITROSTAT) 0.4 MG SL tablet Place 1 tablet (0.4 mg total) under the tongue every 5 (five) minutes as needed for chest pain. 12/05/16 12/05/17  Jerrol Banana., MD  omeprazole (PRILOSEC) 20 MG capsule TAKE 1 CAPSULE BY MOUTH  DAILY 04/20/17   Jerrol Banana., MD  ondansetron [facility] ODT) 4 MG disintegrating tablet Take 1 tablet (4 mg total) by mouth every 8 (eight) hours as needed for nausea or vomiting. 08/26/17   Paulette Blanch, MD  potassium chloride (K-DUR,KLOR-CON) 10 MEQ tablet TAKE 1 TABLET BY MOUTH TWO  TIMES DAILY 04/20/17   Jerrol Banana., MD  sertraline (ZOLOFT) 50 MG tablet TAKE 1 TABLET BY MOUTH  DAILY 04/20/17   Jerrol Banana., MD    Allergies Iodinated diagnostic agents; Iodine; Lisinopril; and Penicillins  Family History  Problem Relation Age of Onset  . Cancer Mother   . Hyperlipidemia Father   . Heart disease Father   . Vascular Disease Father   . Arthritis Sister   . Colon cancer Sister   . Migraines Brother   . Alcohol abuse Sister   . Dementia Sister   . Bipolar disorder Sister   . Heart attack Sister        around age 61  . Arthritis Sister   . Migraines Sister     Social History Social History   Tobacco Use  . Smoking status: Former Smoker    Packs/day: 0.50    Years: 20.00    Pack years: 10.00    Last attempt to quit: 10/03/2003    Years since quitting: 13.9  . Smokeless tobacco: Never Used  Substance Use Topics  . Alcohol use: No  . Drug use: No    Review of Systems  Constitutional: No fever/chills. Eyes: No visual changes. ENT: No sore throat. Cardiovascular: Denies chest pain. Respiratory: Denies shortness of breath. Gastrointestinal: No abdominal pain.  No nausea, no vomiting.  No diarrhea.  No  constipation. Genitourinary: Negative for dysuria. Musculoskeletal: Negative for back pain. Skin: Negative for rash. Neurological: Positive for headache.  Negative for focal weakness or numbness.   ____________________________________________   PHYSICAL EXAM:  VITAL SIGNS: ED Triage Vitals  Enc Vitals Group     BP 08/25/17 2057 (!) 156/59     Pulse Rate 08/26/17 0040 63     Resp 08/25/17 2057 18     Temp 08/25/17 2057 98.7 F (37.1 C)     Temp Source 08/25/17 2057 Oral     SpO2 08/25/17 2057 96 %     Weight 08/25/17 2057 248 lb (112.5 kg)     Height 08/25/17 2057 5\' 3"  (1.6 m)     Head Circumference --      Peak Flow --      Pain Score 08/25/17 2105 10     Pain Loc --  Pain Edu? --      Excl. in [place]? --     Constitutional: Alert and oriented. Well appearing and in no acute distress. Eyes: Conjunctivae are normal. PERRL. EOMI. Head: Atraumatic. No palpable temporal arteries. Nose: No congestion/rhinnorhea. Mouth/Throat: Mucous membranes are moist.  Oropharynx non-erythematous. Neck: No stridor.  No carotid bruits. Cardiovascular: Normal rate, regular rhythm. Grossly normal heart sounds.  Good peripheral circulation. Respiratory: Normal respiratory effort.  No retractions. Lungs CTAB. Gastrointestinal: Soft and nontender. No distention. No abdominal bruits. No CVA tenderness. Musculoskeletal: No lower extremity tenderness nor edema.  No joint effusions. Neurologic:  Normal speech and language.  CN II-XII grossly intact.  No gross focal neurologic deficits are appreciated. MAEx4. No gait instability. Skin:  Skin is warm, dry and intact. No rash noted.  No petechiae. Psychiatric: Mood and affect are normal. Speech and behavior are normal.  ____________________________________________   LABS (all labs ordered are listed, but only abnormal results are displayed)  Labs Reviewed  COMPREHENSIVE METABOLIC PANEL - Abnormal; Notable for the following components:       Result Value   Chloride 99 (*)    Glucose, Bld 123 (*)    BUN 23 (*)    Creatinine, Ser 1.12 (*)    GFR calc non Af Amer 48 (*)    GFR calc Af Amer 56 (*)    All other components within normal limits  LIPASE, BLOOD  CBC   ____________________________________________  EKG  None ____________________________________________  RADIOLOGY  No results found.  ____________________________________________   PROCEDURES  Procedure(s) performed: None  Procedures  Critical Care performed: No  ____________________________________________   INITIAL IMPRESSION / ASSESSMENT AND PLAN / ED COURSE  As part of my medical decision making, I reviewed the following data within the [place] History obtained from family, Nursing notes reviewed and incorporated, Labs reviewed and Notes from prior ED visits.   71 year old female who presents with gradual onset headache incurred while arguing with grandson.  Differential diagnosis includes but is not limited to migraine headache, cephalgia, encephalopathy, meningitis, carotid artery dissection.  Neck is supple, no focal neurological deficits on examination.  Patient received Percocet and Zofran prior to being roomed and is currently pain-free.  No indication at this time for CT head.  Updated patient and daughter on lab work and encouraged her to drink plenty of fluids.  Will discharge home with prescriptions for Fioricet and Zofran.  Strict return precautions given.  Both verbalize understanding and agree with plan of care.      ____________________________________________   FINAL CLINICAL IMPRESSION(S) / ED DIAGNOSES  Final diagnoses:  Acute nonintractable headache, unspecified headache type  Dehydration     ED Discharge Orders        Ordered    butalbital-acetaminophen-caffeine (FIORICET, ESGIC) 50-325-40 MG tablet  Every 4 hours PRN     08/26/17 0053    ondansetron (ZOFRAN ODT) 4 MG disintegrating tablet  Every  8 hours PRN     08/26/17 0053       Note:  This document was prepared using Dragon voice recognition software and may include unintentional dictation errors.    Paulette Blanch, MD 08/26/17 [phone]

## 2017-08-26 NOTE — Discharge Instructions (Signed)
1.  You may take medicines as needed for headache and nausea (Fioricet/Zofran #20). 2.  Drink plenty of fluids daily. 3.  Return to the ER for worsening symptoms, persistent vomiting, lethargy or other concerns.

## 2017-08-28 ENCOUNTER — Ambulatory Visit
Admission: RE | Admit: 2017-08-28 | Discharge: 2017-08-28 | Disposition: A | Discharge status: Home / Self Care | Payer: MEDICARE

## 2017-08-28 DIAGNOSIS — Z950 Presence of cardiac pacemaker: Principal | ICD-10-CM

## 2017-08-28 DIAGNOSIS — Z45018 Encounter for adjustment and management of other part of cardiac pacemaker: Secondary | ICD-10-CM | POA: Diagnosis not present

## 2017-08-28 DIAGNOSIS — Z4502 Encounter for adjustment and management of automatic implantable cardiac defibrillator: Secondary | ICD-10-CM | POA: Diagnosis not present

## 2017-09-04 DIAGNOSIS — H43813 Vitreous degeneration, bilateral: Secondary | ICD-10-CM | POA: Diagnosis not present

## 2017-09-11 ENCOUNTER — Ambulatory Visit: Payer: Self-pay | Admitting: Family Medicine

## 2017-09-14 ENCOUNTER — Ambulatory Visit (INDEPENDENT_AMBULATORY_CARE_PROVIDER_SITE_OTHER): Payer: Medicare Other | Admitting: Family Medicine

## 2017-09-14 DIAGNOSIS — Z23 Encounter for immunization: Secondary | ICD-10-CM | POA: Diagnosis not present

## 2017-10-03 ENCOUNTER — Ambulatory Visit (INDEPENDENT_AMBULATORY_CARE_PROVIDER_SITE_OTHER): Payer: Medicare Other | Admitting: Family Medicine

## 2017-10-03 ENCOUNTER — Encounter: Payer: Self-pay | Admitting: Family Medicine

## 2017-10-03 VITALS — BP 118/58 | HR 75 | Temp 98.6°F | Resp 16 | Wt 262.0 lb

## 2017-10-03 DIAGNOSIS — J329 Chronic sinusitis, unspecified: Secondary | ICD-10-CM | POA: Diagnosis not present

## 2017-10-03 DIAGNOSIS — J069 Acute upper respiratory infection, unspecified: Secondary | ICD-10-CM

## 2017-10-03 MED ORDER — AZITHROMYCIN 250 MG PO TABS: ORAL_TABLET | ORAL | 0 refills | Status: AC

## 2017-10-03 NOTE — Progress Notes (Signed)
Patient: Anna Marsh Female    DOB: 01-06-1946   72 y.o.   MRN: [id number] Visit Date: 10/03/2017  Today's Provider: Lelon Huh, MD   Chief Complaint  Patient presents with  . Cough    x 10 days   Subjective:    Cough  This is a new problem. Episode onset: 10 days ago. Associated symptoms include nasal congestion, postnasal drip, rhinorrhea, a sore throat (now resolved ) and wheezing. Pertinent negatives include no chest pain, chills, ear congestion, ear pain, fever, headaches, hemoptysis, myalgias or shortness of breath. Treatments tried: Coricidin HBP. The treatment provided no relief.   No chest congestion , but having a lot of nasal and sinus congestion. Drainage had been with some green sputum.     Allergies  Allergen Reactions  . Iodinated Diagnostic Agents Other (See Comments)    Causes migraines  . Iodine     Contrast Dye causes migraines  . Lisinopril Cough  . Penicillins Rash     Current Outpatient Medications:  .  aspirin EC 325 MG tablet, Take 1 tablet (325 mg total) by mouth daily., Disp: 30 tablet, Rfl: 0 .  atorvastatin (LIPITOR) 80 MG tablet, TAKE 1 TABLET BY MOUTH  DAILY, Disp: 90 tablet, Rfl: 3 .  butalbital-acetaminophen-caffeine (FIORICET, ESGIC) 50-325-40 MG tablet, Take 1 tablet by mouth every 4 (four) hours as needed for headache., Disp: 20 tablet, Rfl: 0 .  cyanocobalamin 1000 MCG tablet, Take by mouth., Disp: , Rfl:  .  ENTRESTO 24-26 MG, , Disp: , Rfl:  .  furosemide (LASIX) 40 MG tablet, TAKE 1 TABLET BY MOUTH  DAILY, Disp: 90 tablet, Rfl: 3 .  gabapentin (NEURONTIN) 100 MG capsule, Take 100 mg by mouth at bedtime., Disp: , Rfl:  .  loratadine (CLARITIN) 10 MG tablet, Take by mouth., Disp: , Rfl:  .  metoprolol succinate (TOPROL-XL) 25 MG 24 hr tablet, TAKE 1 TABLET BY MOUTH  DAILY, Disp: 90 tablet, Rfl: 3 .  MULTIPLE VITAMIN PO, Take by mouth., Disp: , Rfl:  .  nitroGLYCERIN (NITROSTAT) 0.4 MG SL tablet, Place 1 tablet (0.4 mg  total) under the tongue every 5 (five) minutes as needed for chest pain., Disp: 30 tablet, Rfl: 3 .  omeprazole (PRILOSEC) 20 MG capsule, TAKE 1 CAPSULE BY MOUTH  DAILY, Disp: 90 capsule, Rfl: 3 .  ondansetron (ZOFRAN ODT) 4 MG disintegrating tablet, Take 1 tablet (4 mg total) by mouth every 8 (eight) hours as needed for nausea or vomiting., Disp: 20 tablet, Rfl: 0 .  potassium chloride (K-DUR,KLOR-CON) 10 MEQ tablet, TAKE 1 TABLET BY MOUTH TWO  TIMES DAILY, Disp: 180 tablet, Rfl: 3 .  sertraline (ZOLOFT) 50 MG tablet, TAKE 1 TABLET BY MOUTH  DAILY, Disp: 90 tablet, Rfl: 3  Review of Systems  Constitutional: Positive for fatigue. Negative for appetite change, chills, diaphoresis and fever.  HENT: Positive for congestion (nasal congestion), postnasal drip, rhinorrhea and sore throat (now resolved ). Negative for ear pain.   Respiratory: Positive for cough (productive with clear sputum) and wheezing. Negative for hemoptysis, chest tightness and shortness of breath.   Cardiovascular: Negative for chest pain and palpitations.  Gastrointestinal: Negative for abdominal pain, nausea and vomiting.  Musculoskeletal: Negative for myalgias.  Neurological: Negative for dizziness, weakness and headaches.    Social History   Tobacco Use  . Smoking status: Former Smoker    Packs/day: 0.50    Years: 20.00    Pack years: 10.00  Last attempt to quit: 10/03/2003    Years since quitting: 14.0  . Smokeless tobacco: Never Used  Substance Use Topics  . Alcohol use: No   Objective:   BP (!) 118/58 (BP Location: Left Arm, Patient Position: Sitting, Cuff Size: Large)   Pulse 75   Temp 98.6 F (37 C) (Oral)   Resp 16   Wt 262 lb (118.8 kg)   SpO2 97% Comment: room air  BMI 46.41 kg/m  There were no vitals filed for this visit.   Physical Exam  General Appearance:    Alert, cooperative, no distress  HENT:   bilateral TM normal without fluid or infection, bilateral , frontal sinus tender and nasal  mucosa congested  Eyes:    PERRL, conjunctiva/corneas clear, EOM's intact       Lungs:     Clear to auscultation bilaterally, respirations unlabored  Heart:    Regular rate and rhythm  Neurologic:   Awake, alert, oriented x 3. No apparent focal neurological           defect.           Assessment & Plan:     1. Upper respiratory tract infection, unspecified type  - azithromycin (ZITHROMAX) 250 MG tablet; 2 by mouth today, then 1 daily for 4 days  Dispense: 6 tablet; Refill: 0       Lelon Huh, MD  [facility]

## 2017-10-10 ENCOUNTER — Telehealth: Payer: Self-pay | Admitting: Family Medicine

## 2017-10-10 NOTE — Telephone Encounter (Signed)
Thanks

## 2017-10-10 NOTE — Telephone Encounter (Signed)
Pt states RN's come to her house for this and she doesn't want to come in.

## 2017-11-14 IMAGING — MG MM DIGITAL SCREENING BILAT W/ CAD
6 series · 6 of 6 positions shown · non-contrast
Comparison: Previous exam(s).

CLINICAL DATA: Screening.

EXAM:
DIGITAL SCREENING BILATERAL MAMMOGRAM WITH CAD

[L MLO (1 of 2)]
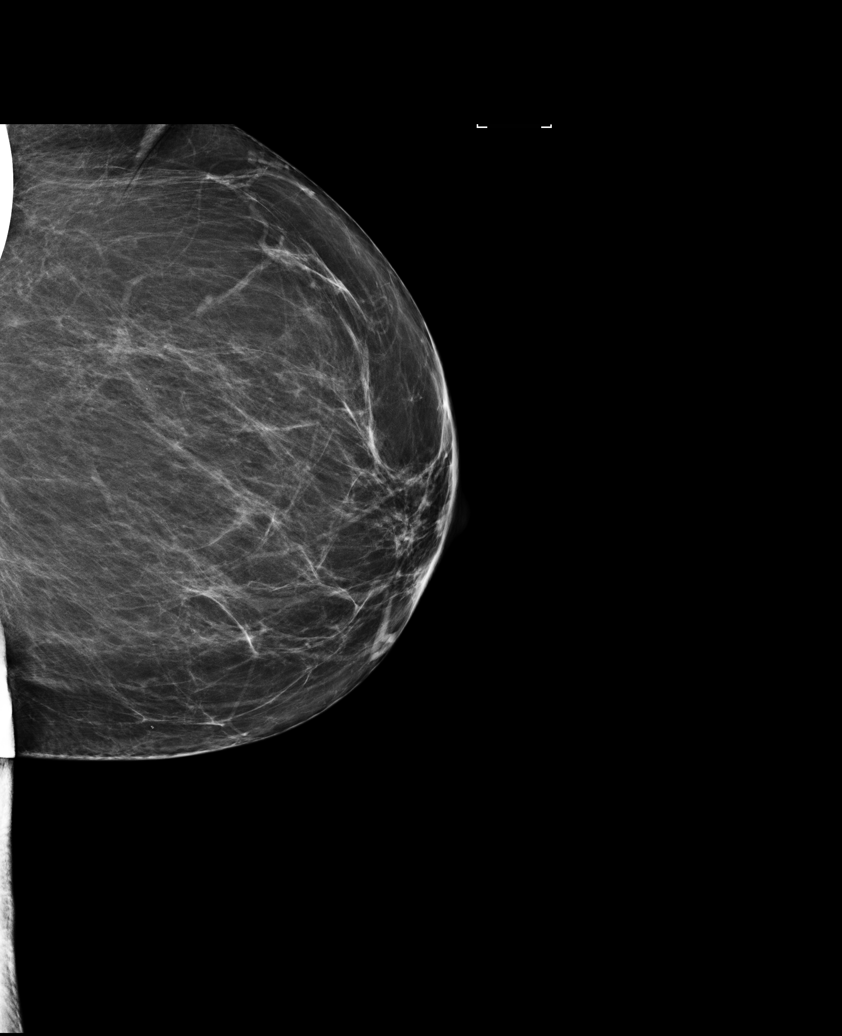

[L CC]
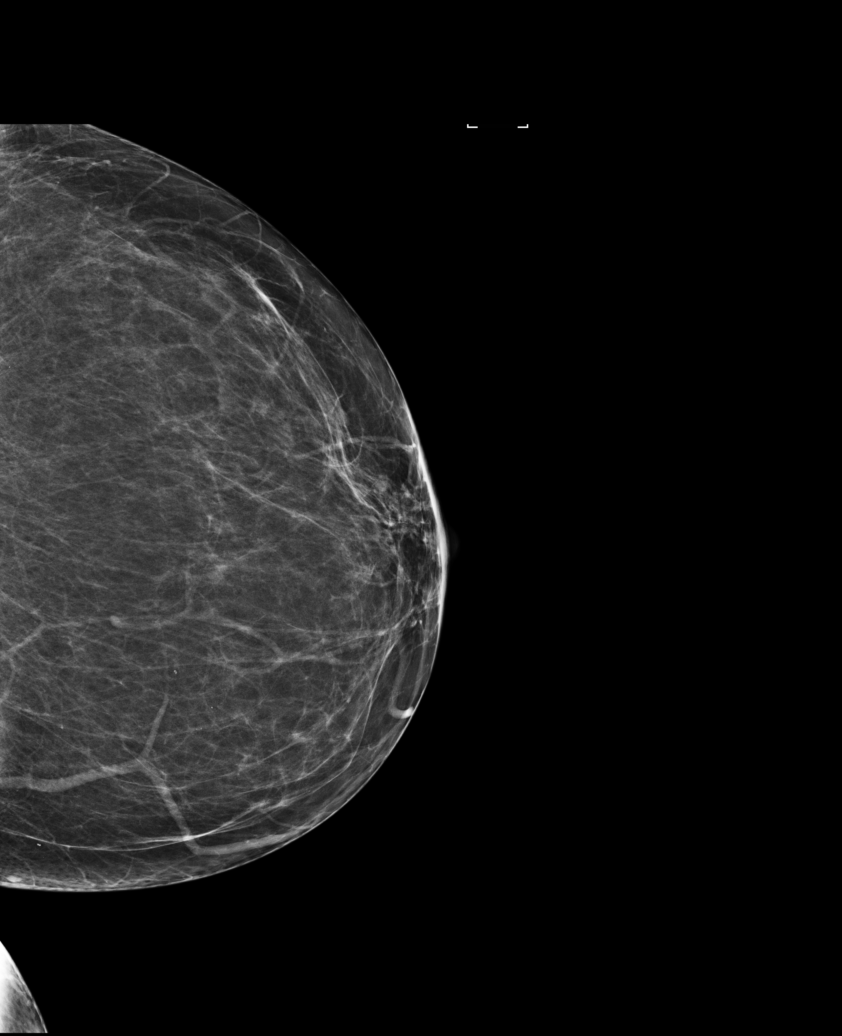

[R CC]
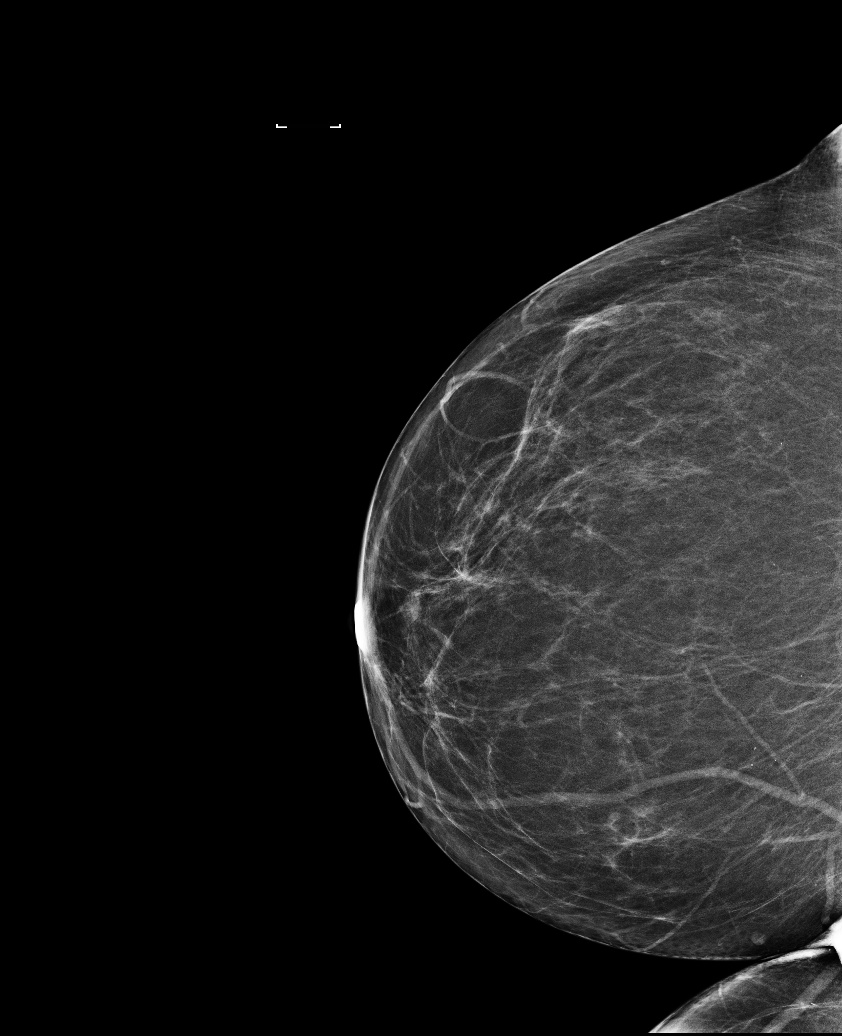

[R MLO (1 of 2)]
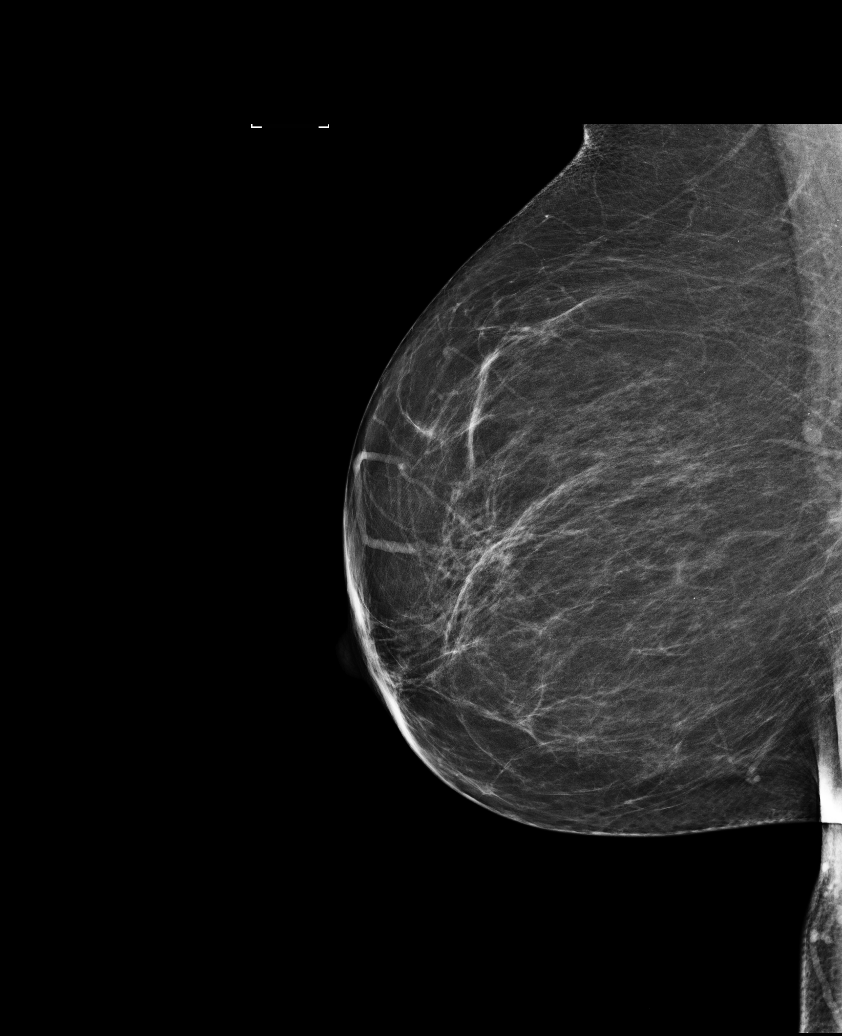

[R MLO (2 of 2)]
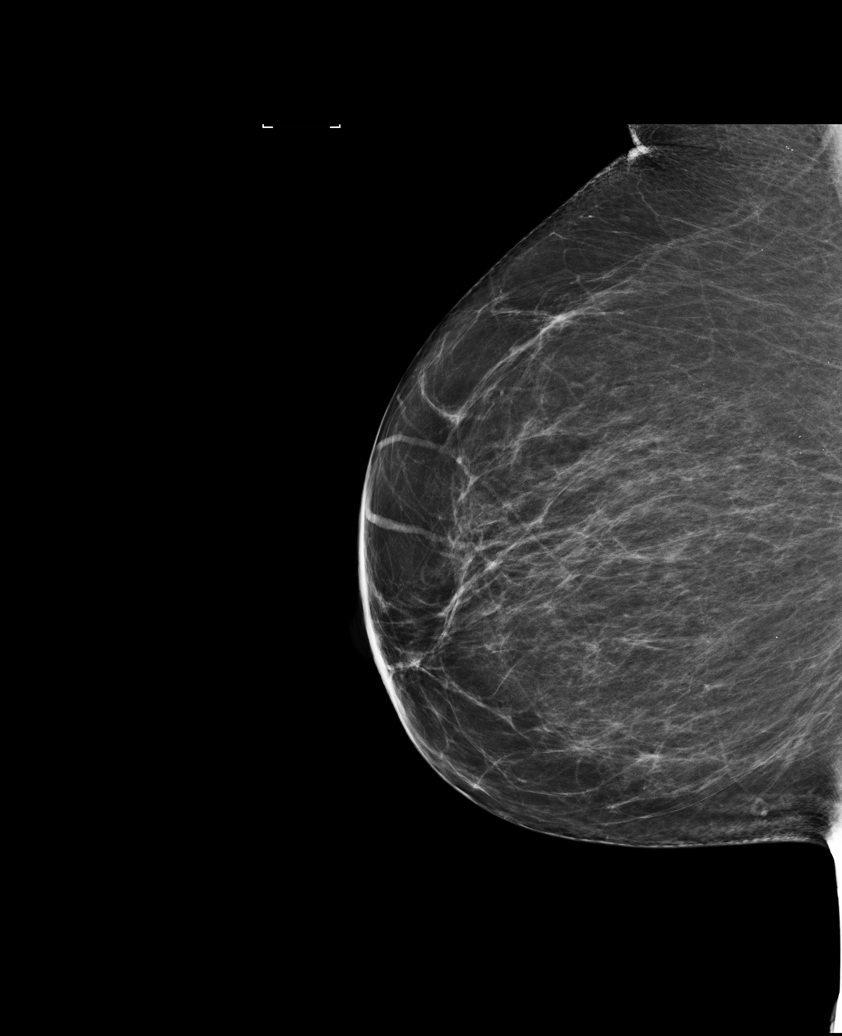

[L MLO (2 of 2)]
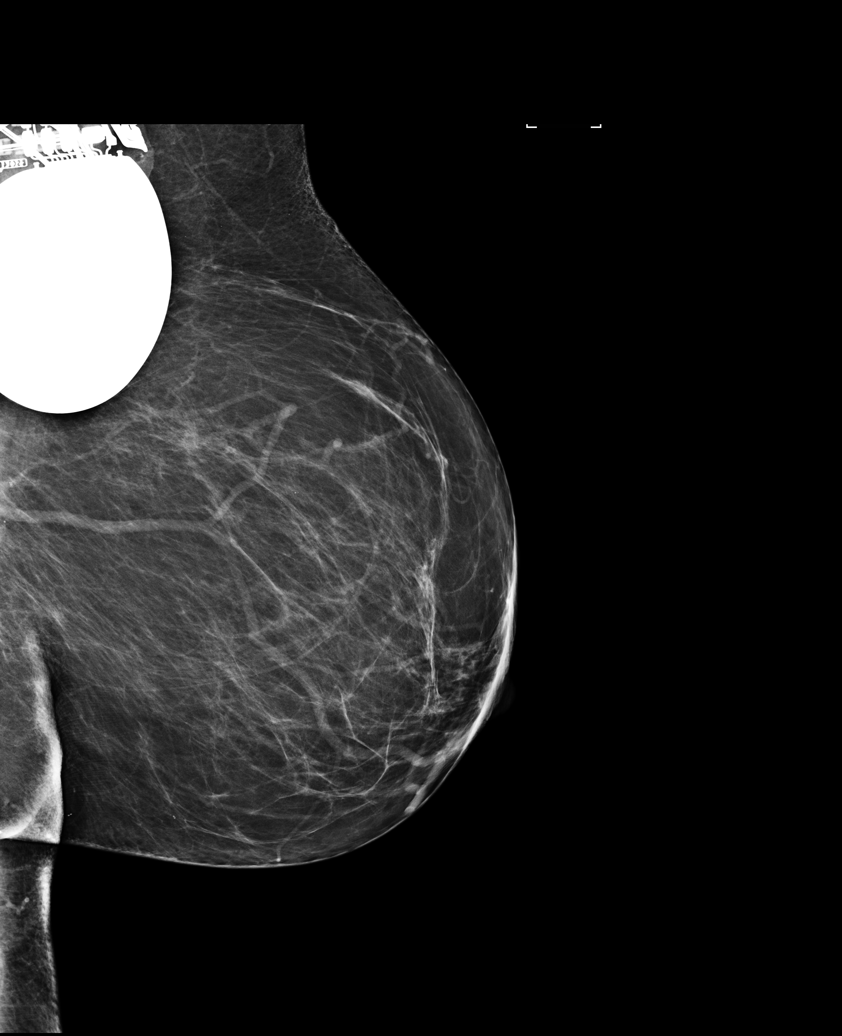

[6 of 6 positions shown; findings below may reference images not displayed]

ACR Breast Density Category b: There are scattered areas of
fibroglandular density.
FINDINGS: There are no findings suspicious for malignancy. Images were
processed with CAD.
IMPRESSION: No mammographic evidence of malignancy. A result letter of this
screening mammogram will be mailed directly to the patient.

RECOMMENDATION:
Screening mammogram in one year. (Code:AS-G-LCT)

BI-RADS CATEGORY  1: Negative.

## 2017-11-27 ENCOUNTER — Encounter: Admit: 2017-11-27 | Discharge: 2017-11-28 | Payer: MEDICARE

## 2017-11-27 DIAGNOSIS — Z95810 Presence of automatic (implantable) cardiac defibrillator: Principal | ICD-10-CM

## 2017-11-27 DIAGNOSIS — I255 Ischemic cardiomyopathy: Secondary | ICD-10-CM | POA: Diagnosis not present

## 2017-11-27 DIAGNOSIS — Z4509 Encounter for adjustment and management of other cardiac device: Secondary | ICD-10-CM | POA: Diagnosis not present

## 2017-12-03 ENCOUNTER — Other Ambulatory Visit: Payer: Self-pay | Admitting: Family Medicine

## 2017-12-03 DIAGNOSIS — Z1231 Encounter for screening mammogram for malignant neoplasm of breast: Secondary | ICD-10-CM

## 2018-01-11 ENCOUNTER — Ambulatory Visit
Admission: RE | Admit: 2018-01-11 | Discharge: 2018-01-11 | Disposition: A | Discharge status: Home / Self Care | Payer: Medicare Other | Source: Ambulatory Visit | Attending: Family Medicine | Admitting: Family Medicine

## 2018-01-11 DIAGNOSIS — Z1231 Encounter for screening mammogram for malignant neoplasm of breast: Secondary | ICD-10-CM | POA: Insufficient documentation

## 2018-01-28 ENCOUNTER — Ambulatory Visit: Payer: Self-pay | Admitting: Family Medicine

## 2018-01-30 ENCOUNTER — Telehealth: Payer: Self-pay | Admitting: Family Medicine

## 2018-01-30 NOTE — Telephone Encounter (Signed)
Spoke with patient she states that a nurse comes to  Their home now, she states she will call back to reschedule her appt. Patient didn't want to schedule the AWV.

## 2018-02-12 ENCOUNTER — Telehealth: Payer: Self-pay | Admitting: Family Medicine

## 2018-02-12 NOTE — Telephone Encounter (Signed)
Patient chooses not to do her AWV at this time because she has a nurse that comes out to her house.

## 2018-02-28 ENCOUNTER — Encounter: Admit: 2018-02-28 | Discharge: 2018-03-01 | Payer: MEDICARE | Attending: Family | Primary: Family

## 2018-02-28 ENCOUNTER — Ambulatory Visit: Admit: 2018-02-28 | Discharge: 2018-03-01 | Payer: MEDICARE

## 2018-02-28 DIAGNOSIS — I429 Cardiomyopathy, unspecified: Principal | ICD-10-CM

## 2018-02-28 DIAGNOSIS — Z95810 Presence of automatic (implantable) cardiac defibrillator: Principal | ICD-10-CM

## 2018-02-28 DIAGNOSIS — I447 Left bundle-branch block, unspecified: Secondary | ICD-10-CM

## 2018-02-28 DIAGNOSIS — Z886 Allergy status to analgesic agent status: Secondary | ICD-10-CM | POA: Diagnosis not present

## 2018-02-28 DIAGNOSIS — Z880 Allergy status to penicillin: Secondary | ICD-10-CM | POA: Diagnosis not present

## 2018-02-28 DIAGNOSIS — R55 Syncope and collapse: Secondary | ICD-10-CM | POA: Diagnosis not present

## 2018-02-28 DIAGNOSIS — I6523 Occlusion and stenosis of bilateral carotid arteries: Secondary | ICD-10-CM | POA: Diagnosis not present

## 2018-02-28 DIAGNOSIS — Z45018 Encounter for adjustment and management of other part of cardiac pacemaker: Secondary | ICD-10-CM | POA: Diagnosis not present

## 2018-02-28 DIAGNOSIS — Z888 Allergy status to other drugs, medicaments and biological substances status: Secondary | ICD-10-CM | POA: Diagnosis not present

## 2018-02-28 DIAGNOSIS — E785 Hyperlipidemia, unspecified: Secondary | ICD-10-CM | POA: Diagnosis not present

## 2018-02-28 DIAGNOSIS — I10 Essential (primary) hypertension: Secondary | ICD-10-CM | POA: Diagnosis not present

## 2018-02-28 DIAGNOSIS — E119 Type 2 diabetes mellitus without complications: Secondary | ICD-10-CM | POA: Diagnosis not present

## 2018-02-28 MED ORDER — METOPROLOL TARTRATE 25 MG TABLET
ORAL_TABLET | Freq: Two times a day (BID) | ORAL | 6 refills | 0.00000 days | Status: SS
Start: 2018-02-28 — End: 2018-07-16

## 2018-03-20 ENCOUNTER — Ambulatory Visit (INDEPENDENT_AMBULATORY_CARE_PROVIDER_SITE_OTHER): Payer: Medicare Other | Admitting: Family Medicine

## 2018-03-20 ENCOUNTER — Encounter: Payer: Self-pay | Admitting: Family Medicine

## 2018-03-20 VITALS — BP 122/68 | HR 68 | Temp 98.5°F | Resp 16 | Ht 63.0 in | Wt 254.0 lb

## 2018-03-20 DIAGNOSIS — R7303 Prediabetes: Secondary | ICD-10-CM

## 2018-03-20 DIAGNOSIS — E782 Mixed hyperlipidemia: Secondary | ICD-10-CM

## 2018-03-20 DIAGNOSIS — I10 Essential (primary) hypertension: Secondary | ICD-10-CM

## 2018-03-20 MED ORDER — METOPROLOL TARTRATE 25 MG PO TABS: 25.0000 mg | ORAL_TABLET | Freq: Two times a day (BID) | ORAL | 3 refills | Status: DC

## 2018-03-20 NOTE — Progress Notes (Signed)
Patient: Anna Marsh Female    DOB: 08-20-1946   72 y.o.   MRN: [id number] Visit Date: 03/20/2018  Today's Provider: Wilhemena Durie, MD   Chief Complaint  Patient presents with  . Hypertension   Subjective:    HPI  Hypertension, follow-up:  BP Readings from Last 3 Encounters:  03/20/18 122/68  10/03/17 (!) 118/58  08/26/17 (!) 124/49    She was last seen for hypertension 8 months ago.  BP at that visit was 118/58. Management since that visit includes no changes. She reports good compliance with treatment. She is not having side effects.  She is not exercising. She is adherent to low salt diet.   Outside blood pressures are not being checked. She is experiencing none.  Patient denies exertional chest pressure/discomfort, lower extremity edema and palpitations.   Cardiovascular risk factors include dyslipidemia.   Weight trend: stable Wt Readings from Last 3 Encounters:  03/20/18 254 lb (115.2 kg)  10/03/17 262 lb (118.8 kg)  08/25/17 248 lb (112.5 kg)    Current diet: well balanced     Allergies  Allergen Reactions  . Iodinated Diagnostic Agents Other (See Comments)    Causes migraines  . Iodine     Contrast Dye causes migraines  . Lisinopril Cough  . Penicillins Rash     Current Outpatient Medications:  .  aspirin EC 325 MG tablet, Take 1 tablet (325 mg total) by mouth daily., Disp: 30 tablet, Rfl: 0 .  atorvastatin (LIPITOR) 80 MG tablet, TAKE 1 TABLET BY MOUTH  DAILY, Disp: 90 tablet, Rfl: 3 .  butalbital-acetaminophen-caffeine (FIORICET, ESGIC) 50-325-40 MG tablet, Take 1 tablet by mouth every 4 (four) hours as needed for headache., Disp: 20 tablet, Rfl: 0 .  cyanocobalamin 1000 MCG tablet, Take by mouth., Disp: , Rfl:  .  ENTRESTO 24-26 MG, , Disp: , Rfl:  .  furosemide (LASIX) 40 MG tablet, TAKE 1 TABLET BY MOUTH  DAILY, Disp: 90 tablet, Rfl: 3 .  gabapentin (NEURONTIN) 100 MG capsule, Take 100 mg by mouth at bedtime., Disp: , Rfl:   .  loratadine (CLARITIN) 10 MG tablet, Take by mouth., Disp: , Rfl:  .  metoprolol succinate (TOPROL-XL) 25 MG 24 hr tablet, TAKE 1 TABLET BY MOUTH  DAILY, Disp: 90 tablet, Rfl: 3 .  MULTIPLE VITAMIN PO, Take by mouth., Disp: , Rfl:  .  omeprazole (PRILOSEC) 20 MG capsule, TAKE 1 CAPSULE BY MOUTH  DAILY, Disp: 90 capsule, Rfl: 3 .  ondansetron (ZOFRAN ODT) 4 MG disintegrating tablet, Take 1 tablet (4 mg total) by mouth every 8 (eight) hours as needed for nausea or vomiting., Disp: 20 tablet, Rfl: 0 .  potassium chloride (K-DUR,KLOR-CON) 10 MEQ tablet, TAKE 1 TABLET BY MOUTH TWO  TIMES DAILY, Disp: 180 tablet, Rfl: 3 .  sertraline (ZOLOFT) 50 MG tablet, TAKE 1 TABLET BY MOUTH  DAILY, Disp: 90 tablet, Rfl: 3 .  nitroGLYCERIN (NITROSTAT) 0.4 MG SL tablet, Place 1 tablet (0.4 mg total) under the tongue every 5 (five) minutes as needed for chest pain., Disp: 30 tablet, Rfl: 3  Review of Systems  Constitutional: Negative for activity change, appetite change, chills, diaphoresis, fatigue, fever and unexpected weight change.  Eyes: Negative.   Respiratory: Negative for shortness of breath.   Cardiovascular: Negative for chest pain, palpitations and leg swelling.  Musculoskeletal: Negative for arthralgias.  Skin: Negative.   Allergic/Immunologic: Negative.   Neurological: Negative for dizziness and headaches.  Psychiatric/Behavioral: Negative  for agitation, decreased concentration, self-injury and sleep disturbance. The patient is not nervous/anxious.     Social History   Tobacco Use  . Smoking status: Former Smoker    Packs/day: 0.50    Years: 20.00    Pack years: 10.00    Last attempt to quit: 10/03/2003    Years since quitting: 14.4  . Smokeless tobacco: Never Used  Substance Use Topics  . Alcohol use: No   Objective:   BP 122/68 (BP Location: Right Arm, Patient Position: Sitting, Cuff Size: Large)   Pulse 68   Temp 98.5 F (36.9 C)   Resp 16   Ht 5\' 3"  (1.6 m)   Wt 254 lb (115.2  kg)   SpO2 98%   BMI 44.99 kg/m    Physical Exam  Constitutional: She is oriented to person, place, and time. She appears well-developed and well-nourished.  HENT:  Head: Normocephalic and atraumatic.  Right Ear: External ear normal.  Left Ear: External ear normal.  Nose: Nose normal.  Eyes: Conjunctivae are normal. No scleral icterus.  Neck: No thyromegaly present.  Cardiovascular: Normal rate, regular rhythm and normal heart sounds.  Pulmonary/Chest: Effort normal and breath sounds normal.  Abdominal: Soft.  Musculoskeletal: She exhibits no edema.  Neurological: She is alert and oriented to person, place, and time.  Skin: Skin is warm and dry.  Psychiatric: She has a normal mood and affect. Her behavior is normal. Judgment and thought content normal.        Assessment & Plan:     1. Essential hypertension  - CBC with Differential - TSH - Comprehensive metabolic panel  2. 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 Development worker, community has been used and  any errors in dictation or transcription are unintentional. Miguel Aschoff M.D. [place], MD  [facility]

## 2018-04-03 ENCOUNTER — Encounter
Admit: 2018-04-03 | Discharge: 2018-04-04 | Payer: MEDICARE | Attending: Orthopaedic Surgery of the Spine | Primary: Orthopaedic Surgery of the Spine

## 2018-04-03 DIAGNOSIS — M5412 Radiculopathy, cervical region: Principal | ICD-10-CM

## 2018-04-03 MED ORDER — GABAPENTIN 100 MG CAPSULE
ORAL_CAPSULE | Freq: Two times a day (BID) | ORAL | 11 refills | 0.00000 days | Status: CP
Start: 2018-04-03 — End: 2018-04-03

## 2018-04-03 MED ORDER — GABAPENTIN 100 MG CAPSULE: 100 mg | capsule | Freq: Two times a day (BID) | 11 refills | 0 days | Status: AC

## 2018-04-08 DIAGNOSIS — R7303 Prediabetes: Secondary | ICD-10-CM | POA: Diagnosis not present

## 2018-04-08 DIAGNOSIS — I10 Essential (primary) hypertension: Secondary | ICD-10-CM | POA: Diagnosis not present

## 2018-04-08 DIAGNOSIS — E782 Mixed hyperlipidemia: Secondary | ICD-10-CM | POA: Diagnosis not present

## 2018-04-09 ENCOUNTER — Telehealth: Payer: Self-pay

## 2018-04-09 LAB — COMPREHENSIVE METABOLIC PANEL
ALK PHOS: 128 IU/L — AB (ref 39–117)
ALT: 14 IU/L (ref 0–32)
AST: 16 IU/L (ref 0–40)
Albumin/Globulin Ratio: 1.6 (ref 1.2–2.2)
Albumin: 3.7 g/dL (ref 3.5–4.8)
BUN/Creatinine Ratio: 25 (ref 12–28)
BUN: 25 mg/dL (ref 8–27)
Bilirubin Total: <0.2 mg/dL (ref 0.0–1.2)
CO2: 19 mmol/L — AB (ref 20–29)
CREATININE: 0.99 mg/dL (ref 0.57–1.00)
Calcium: 8.8 mg/dL (ref 8.7–10.3)
Chloride: 106 mmol/L (ref 96–106)
GFR calc Af Amer: 66 mL/min/{1.73_m2} (ref >59)
GFR calc non Af Amer: 57 mL/min/{1.73_m2} — ABNORMAL LOW (ref >59)
GLUCOSE: 104 mg/dL — AB (ref 65–99)
Globulin, Total: 2.3 g/dL (ref 1.5–4.5)
Potassium: 4.7 mmol/L (ref 3.5–5.2)
Sodium: 145 mmol/L — ABNORMAL HIGH (ref 134–144)
Total Protein: 6 g/dL (ref 6.0–8.5)

## 2018-04-09 LAB — CBC WITH DIFFERENTIAL/PLATELET
Basophils Absolute: 0 10*3/uL (ref 0.0–0.2)
Basos: 0 %
EOS (ABSOLUTE): 0.3 10*3/uL (ref 0.0–0.4)
Eos: 5 %
HEMATOCRIT: 37.6 % (ref 34.0–46.6)
HEMOGLOBIN: 12 g/dL (ref 11.1–15.9)
Immature Grans (Abs): 0 10*3/uL (ref 0.0–0.1)
Immature Granulocytes: 0 %
LYMPHS ABS: 1.6 10*3/uL (ref 0.7–3.1)
Lymphs: 28 %
MCH: 27.8 pg (ref 26.6–33.0)
MCHC: 31.9 g/dL (ref 31.5–35.7)
MCV: 87 fL (ref 79–97)
MONOCYTES: 7 %
MONOS ABS: 0.4 10*3/uL (ref 0.1–0.9)
NEUTROS ABS: 3.4 10*3/uL (ref 1.4–7.0)
Neutrophils: 60 %
Platelets: 233 10*3/uL (ref 150–450)
RBC: 4.31 x10E6/uL (ref 3.77–5.28)
RDW: 14.6 % (ref 12.3–15.4)
WBC: 5.6 10*3/uL (ref 3.4–10.8)

## 2018-04-09 LAB — LIPID PANEL
Chol/HDL Ratio: 3.7 ratio (ref 0.0–4.4)
Cholesterol, Total: 176 mg/dL (ref 100–199)
HDL: 47 mg/dL (ref >39)
LDL CALC: 101 mg/dL — AB (ref 0–99)
Triglycerides: 139 mg/dL (ref 0–149)
VLDL CHOLESTEROL CAL: 28 mg/dL (ref 5–40)

## 2018-04-09 LAB — HEMOGLOBIN A1C
ESTIMATED AVERAGE GLUCOSE: 114 mg/dL
Hgb A1c MFr Bld: 5.6 % (ref 4.8–5.6)

## 2018-04-09 LAB — TSH: TSH: 2.76 u[IU]/mL (ref 0.450–4.500)

## 2018-04-09 NOTE — Telephone Encounter (Signed)
-----   Message from Jerrol Banana., MD sent at 04/09/2018  8:28 AM EDT ----- Labs OK--push hydration some.

## 2018-04-09 NOTE — Telephone Encounter (Signed)
Patient advised as below.  

## 2018-04-23 DIAGNOSIS — I5022 Chronic systolic (congestive) heart failure: Secondary | ICD-10-CM | POA: Diagnosis not present

## 2018-04-23 DIAGNOSIS — I10 Essential (primary) hypertension: Secondary | ICD-10-CM | POA: Diagnosis not present

## 2018-04-23 DIAGNOSIS — E782 Mixed hyperlipidemia: Secondary | ICD-10-CM | POA: Diagnosis not present

## 2018-04-23 DIAGNOSIS — R55 Syncope and collapse: Secondary | ICD-10-CM | POA: Diagnosis not present

## 2018-04-23 DIAGNOSIS — I429 Cardiomyopathy, unspecified: Secondary | ICD-10-CM | POA: Diagnosis not present

## 2018-04-30 ENCOUNTER — Encounter: Admit: 2018-04-30 | Discharge: 2018-05-01 | Payer: MEDICARE

## 2018-04-30 DIAGNOSIS — Z4502 Encounter for adjustment and management of automatic implantable cardiac defibrillator: Secondary | ICD-10-CM

## 2018-04-30 DIAGNOSIS — I429 Cardiomyopathy, unspecified: Principal | ICD-10-CM

## 2018-05-20 ENCOUNTER — Other Ambulatory Visit: Payer: Self-pay | Admitting: Family Medicine

## 2018-06-24 DIAGNOSIS — R55 Syncope and collapse: Secondary | ICD-10-CM | POA: Diagnosis not present

## 2018-06-25 ENCOUNTER — Ambulatory Visit (INDEPENDENT_AMBULATORY_CARE_PROVIDER_SITE_OTHER): Payer: Medicare Other | Admitting: Family Medicine

## 2018-06-25 ENCOUNTER — Telehealth: Payer: Self-pay | Admitting: Family Medicine

## 2018-06-25 VITALS — BP 112/60 | HR 55 | Temp 98.1°F | Resp 16 | Wt 251.0 lb

## 2018-06-25 DIAGNOSIS — R55 Syncope and collapse: Secondary | ICD-10-CM | POA: Diagnosis not present

## 2018-06-25 DIAGNOSIS — R7303 Prediabetes: Secondary | ICD-10-CM | POA: Diagnosis not present

## 2018-06-25 DIAGNOSIS — Z23 Encounter for immunization: Secondary | ICD-10-CM

## 2018-06-25 DIAGNOSIS — I442 Atrioventricular block, complete: Secondary | ICD-10-CM | POA: Diagnosis not present

## 2018-06-25 DIAGNOSIS — I639 Cerebral infarction, unspecified: Secondary | ICD-10-CM

## 2018-06-25 DIAGNOSIS — Z95810 Presence of automatic (implantable) cardiac defibrillator: Secondary | ICD-10-CM

## 2018-06-25 NOTE — Telephone Encounter (Signed)
Patient has studies scheduled in town

## 2018-06-25 NOTE — Telephone Encounter (Signed)
error 

## 2018-06-25 NOTE — Telephone Encounter (Signed)
Pt's daughter [name] called to let Jiles Garter know Festus Aloe is doing to do a carotid duplex doppler on Nov. 12th. Pt's daughter is wanting a call back to ask some questions.  Please advise.  Thanks, [employer]

## 2018-06-25 NOTE — Progress Notes (Signed)
Anna Marsh  MRN: [id number] DOB: 05-27-1946  Subjective:  HPI  The patient is a 36 female that presents today with her daughter after having 2 weeks of symptoms suggestive of possible stroke.  The information gathered is from both the patient and daughter.  The daughter states that 2 weeks ago she noticed that the mother had bruising of her lower lip.  She had been to see the dentist and so they thought the bruising was from that visit.   After that it was noticed that the mother had drawing of her mouth to the left and twitching of her left hand.  Family members thought she had a Bells Palsy.   Since that time the patient continues to have some twitching in her left hand and slurring of her speech.  She also complains of getting choked when she eats or drinks.  Yesterday the patient had been out in the heat while with a realtor.  She went to sit in her car to get cooled off and while in the car she passed out.   EMS came out to check on her and they checked her vital signs and hooked her up to the EKG.  She brings with her a strip that the paramedics had given her.   The patient has been under increased stress lately as she is trying to sell her condo after the death of her husband.  The patient would also like to get her flu shot today.  Patient Active Problem List   Diagnosis Date Noted  . TIA (transient ischemic attack) 11/07/2016  . Pre-diabetes 07/20/2015  . Acute anxiety 07/20/2015  . 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 GERD (gastroesophageal reflux disease) 02/28/2015  . Hypercholesterolemia 02/28/2015  . Hypertension 02/28/2015  . Cardiac defibrillator in place 02/28/2015  . Block, bundle branch, left 02/28/2015  . Adiposity 02/28/2015  . Acid reflux 02/28/2015  . 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 Chronic systolic CHF (congestive heart failure), NYHA class 3 ([place]) 02/24/2015  . H/O cardiac catheterization 02/24/2015  . Combined fat and  carbohydrate induced hyperlipemia 02/15/2015  . Cardiomyopathy ([place]) 02/01/2015  . Angina pectoris ([place]) 12/25/2013  . Arthritis 12/25/2013  . 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 AICD (automatic cardioverter/defibrillator) present   . Anxiety   . AV block, complete (H[facility])   . Cervical radiculopathy   . CHF (congestive heart failure) ([place])   . Chronic systolic heart failure ([place])   . GERD (gastroesophageal reflux disease)   . History of cardiac arrest   . Hypercholesteremia   . Hypertension   . Pre-diabetes   . 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 Right arm weakness     Social History   Socioeconomic History  . Marital status: Married    Spouse name: Not on file  . Number of children: Not on file  . Years of education: Not on file  . Highest education level: Not on file  Occupational History  . Not on file  Social Needs  . Financial resource strain: Not on file  . Food insecurity:    Worry: Not on file    Inability: Not on file  . Transportation needs:    Medical: Not on file    Non-medical: Not on file  Tobacco Use  . Smoking status: Former Smoker    Packs/day: 0.50    Years: 20.00  Pack years: 10.00    Last attempt to quit: 10/03/2003    Years since quitting: 14.7  . Smokeless tobacco: Never Used  Substance and Sexual Activity  . Alcohol use: No  . Drug use: No  . Sexual activity: Never  Lifestyle  . Physical activity:    Days per week: Not on file    Minutes per session: Not on file  . Stress: Not on file  Relationships  . Social connections:    Talks on phone: Not on file    Gets together: Not on file    Attends religious service: Not on file    Active member of club or organization: Not on file    Attends meetings of clubs or organizations: Not on file    Relationship status: Not on file  . Intimate partner violence:    Fear of current or ex partner: Not on file     Emotionally abused: Not on file    Physically abused: Not on file    Forced sexual activity: Not on file  Other Topics Concern  . Not on file  Social History Narrative  . Not on file    Outpatient Encounter Medications as of 06/25/2018  Medication Sig Note  . aspirin EC 325 MG tablet Take 1 tablet (325 mg total) by mouth daily.   Marland Kitchen atorvastatin (LIPITOR) 80 MG tablet TAKE 1 TABLET BY MOUTH  DAILY   . butalbital-acetaminophen-caffeine (FIORICET, ESGIC) 50-325-40 MG tablet Take 1 tablet by mouth every 4 (four) hours as needed for headache.   . cyanocobalamin 1000 MCG tablet Take by mouth. 02/28/2015: Received from: [facility]  . ENTRESTO 24-26 MG  10/26/2015: Received from: External Pharmacy  . furosemide (LASIX) 40 MG tablet TAKE 1 TABLET BY MOUTH  DAILY   . gabapentin (NEURONTIN) 100 MG capsule Take 100 mg by mouth at bedtime.   Marland Kitchen loratadine (CLARITIN) 10 MG tablet Take by mouth. 02/28/2015: Received from: [facility]  . metoprolol tartrate (LOPRESSOR) 25 MG tablet Take 1 tablet (25 mg total) by mouth 2 (two) times daily.   . MULTIPLE VITAMIN PO Take by mouth. 02/28/2015: Received from: [facility]  . omeprazole (PRILOSEC) 20 MG capsule TAKE 1 CAPSULE BY MOUTH  DAILY   . ondansetron (ZOFRAN ODT) 4 MG disintegrating tablet Take 1 tablet (4 mg total) by mouth every 8 (eight) hours as needed for nausea or vomiting.   . potassium chloride (K-DUR,KLOR-CON) 10 MEQ tablet TAKE 1 TABLET BY MOUTH TWO  TIMES DAILY   . sertraline (ZOLOFT) 50 MG tablet TAKE 1 TABLET BY MOUTH  DAILY   . nitroGLYCERIN (NITROSTAT) 0.4 MG SL tablet Place 1 tablet (0.4 mg total) under the tongue every 5 (five) minutes as needed for chest pain.    No facility-administered encounter medications on file as of 06/25/2018.     Allergies  Allergen Reactions  . Iodinated Diagnostic Agents Other (See Comments)    Causes migraines  . Iodine     Contrast Dye causes migraines    . Lisinopril Cough  . Penicillins Rash    Review of Systems  Constitutional: Positive for malaise/fatigue. Negative for fever.  Respiratory: Positive for cough. Negative for sputum production, shortness of breath and wheezing.   Cardiovascular: Negative for chest pain, palpitations, orthopnea, claudication and leg swelling.  Gastrointestinal: Positive for diarrhea. Negative for abdominal pain, blood in stool, constipation, heartburn, nausea and vomiting.       Choking episodes when eats or drinks, sometimes  when she isn't eating or drinking.  Neurological: Positive for tremors (twitching of left hand), speech change (slurs when tired), focal weakness (left hand weakness during episodes), loss of consciousness (Passed out while sitting in car after overheating (per patient)) and headaches. Negative for dizziness, tingling, sensory change and seizures.    Objective:  BP 112/60 (BP Location: Right Arm, Patient Position: Sitting, Cuff Size: Large)   Pulse (!) 55   Temp 98.1 F (36.7 C) (Oral)   Resp 16   Wt 251 lb (113.9 kg)   SpO2 95%   BMI 44.46 kg/m   Physical Exam  Constitutional: She is oriented to person, place, and time and well-developed, well-nourished, and in no distress.  HENT:  Head: Normocephalic and atraumatic.  Right Ear: External ear normal.  Left Ear: External ear normal.  Nose: Nose normal.  Eyes: Conjunctivae are normal. No scleral icterus.  Neck: No thyromegaly present.  Cardiovascular: Normal rate, regular rhythm and normal heart sounds.  Pulmonary/Chest: Effort normal and breath sounds normal.  Abdominal: Soft.  Musculoskeletal: She exhibits no edema.  Neurological: She is alert and oriented to person, place, and time. Gait normal. GCS score is 15.  Grossly nonfocal.  Skin: Skin is warm and dry.  Psychiatric: Mood, memory, affect and judgment normal.    Assessment and Plan :   1. Syncope and collapse TIA most likely--possible small CVA with  dysarthria. - TSH - Comprehensive metabolic panel - Sedimentation rate - CBC with Differential/Platelet - Ambulatory referral to Neurology - US Carotid Duplex Bilateral; Future - CT Head Wo Contrast; Future  2. Syncope, unspecified syncope type   3. Cerebrovascular accident (CVA), unspecified mechanism ([place])  Refer to Neurology. - TSH - Comprehensive metabolic panel - Sedimentation rate - CBC with Differential/Platelet - Ambulatory referral to Neurology - US Carotid Duplex Bilateral; Future - CT Head Wo Contrast; Future  4. Need for influenza vaccination  - Flu vaccine HIGH DOSE PF (Fluzone High dose)  5. Complete atrioventricular block ([facility])   6. Pre-diabetes  7.Obesity  I have done the exam and reviewed the chart and it is accurate to the best of my knowledge. Development worker, community has been used and  any errors in dictation or transcription are unintentional. Miguel Aschoff M.D. [facility]   8. Cardiac defibrillator in place  I have done the exam and reviewed the chart and it is accurate to the best of my knowledge. Development worker, community has been used and  any errors in dictation or transcription are unintentional. Miguel Aschoff M.D. [facility]

## 2018-06-26 DIAGNOSIS — I5022 Chronic systolic (congestive) heart failure: Secondary | ICD-10-CM | POA: Diagnosis not present

## 2018-06-26 DIAGNOSIS — R55 Syncope and collapse: Secondary | ICD-10-CM | POA: Diagnosis not present

## 2018-06-26 DIAGNOSIS — R300 Dysuria: Secondary | ICD-10-CM | POA: Diagnosis not present

## 2018-06-26 DIAGNOSIS — E782 Mixed hyperlipidemia: Secondary | ICD-10-CM | POA: Diagnosis not present

## 2018-06-26 DIAGNOSIS — I10 Essential (primary) hypertension: Secondary | ICD-10-CM | POA: Diagnosis not present

## 2018-06-26 LAB — COMPREHENSIVE METABOLIC PANEL
ALBUMIN: 4.1 g/dL (ref 3.5–4.8)
ALK PHOS: 120 IU/L — AB (ref 39–117)
ALT: 13 IU/L (ref 0–32)
AST: 17 IU/L (ref 0–40)
Albumin/Globulin Ratio: 1.6 (ref 1.2–2.2)
BUN / CREAT RATIO: 23 (ref 12–28)
BUN: 22 mg/dL (ref 8–27)
Bilirubin Total: 0.4 mg/dL (ref 0.0–1.2)
CALCIUM: 9.8 mg/dL (ref 8.7–10.3)
CO2: 25 mmol/L (ref 20–29)
CREATININE: 0.97 mg/dL (ref 0.57–1.00)
Chloride: 102 mmol/L (ref 96–106)
GFR, EST AFRICAN AMERICAN: 67 mL/min/{1.73_m2} (ref >59)
GFR, EST NON AFRICAN AMERICAN: 59 mL/min/{1.73_m2} — AB (ref >59)
GLOBULIN, TOTAL: 2.5 g/dL (ref 1.5–4.5)
Glucose: 91 mg/dL (ref 65–99)
Potassium: 4.4 mmol/L (ref 3.5–5.2)
SODIUM: 142 mmol/L (ref 134–144)
TOTAL PROTEIN: 6.6 g/dL (ref 6.0–8.5)

## 2018-06-26 LAB — CBC WITH DIFFERENTIAL/PLATELET
Basophils Absolute: 0 10*3/uL (ref 0.0–0.2)
Basos: 1 %
EOS (ABSOLUTE): 0.3 10*3/uL (ref 0.0–0.4)
Eos: 4 %
HEMOGLOBIN: 12.6 g/dL (ref 11.1–15.9)
Hematocrit: 39 % (ref 34.0–46.6)
IMMATURE GRANS (ABS): 0 10*3/uL (ref 0.0–0.1)
Immature Granulocytes: 1 %
LYMPHS ABS: 1.2 10*3/uL (ref 0.7–3.1)
Lymphs: 19 %
MCH: 27.9 pg (ref 26.6–33.0)
MCHC: 32.3 g/dL (ref 31.5–35.7)
MCV: 86 fL (ref 79–97)
MONOCYTES: 7 %
Monocytes Absolute: 0.5 10*3/uL (ref 0.1–0.9)
NEUTROS ABS: 4.4 10*3/uL (ref 1.4–7.0)
Neutrophils: 68 %
Platelets: 258 10*3/uL (ref 150–450)
RBC: 4.52 x10E6/uL (ref 3.77–5.28)
RDW: 13.1 % (ref 12.3–15.4)
WBC: 6.4 10*3/uL (ref 3.4–10.8)

## 2018-06-26 LAB — TSH: TSH: 1.72 u[IU]/mL (ref 0.450–4.500)

## 2018-06-26 LAB — SEDIMENTATION RATE: Sed Rate: 29 mm/hr (ref 0–40)

## 2018-06-27 ENCOUNTER — Ambulatory Visit
Admission: RE | Admit: 2018-06-27 | Discharge: 2018-06-27 | Disposition: A | Discharge status: Home / Self Care | Payer: Medicare Other | Source: Ambulatory Visit | Attending: Family Medicine | Admitting: Family Medicine

## 2018-06-27 ENCOUNTER — Telehealth: Payer: Self-pay

## 2018-06-27 DIAGNOSIS — R55 Syncope and collapse: Secondary | ICD-10-CM | POA: Diagnosis not present

## 2018-06-27 DIAGNOSIS — I639 Cerebral infarction, unspecified: Secondary | ICD-10-CM | POA: Diagnosis not present

## 2018-06-27 NOTE — Telephone Encounter (Signed)
-----   Message from Jerrol Banana., MD sent at 06/27/2018 12:25 PM EDT ----- Labs OK.

## 2018-06-27 NOTE — Telephone Encounter (Signed)
Patient advised, KW 

## 2018-07-01 ENCOUNTER — Ambulatory Visit
Admission: RE | Admit: 2018-07-01 | Discharge: 2018-07-01 | Disposition: A | Discharge status: Home / Self Care | Payer: Medicare Other | Source: Ambulatory Visit | Attending: Family Medicine | Admitting: Family Medicine

## 2018-07-01 DIAGNOSIS — I639 Cerebral infarction, unspecified: Secondary | ICD-10-CM | POA: Diagnosis not present

## 2018-07-01 DIAGNOSIS — R55 Syncope and collapse: Secondary | ICD-10-CM | POA: Diagnosis not present

## 2018-07-01 DIAGNOSIS — I6523 Occlusion and stenosis of bilateral carotid arteries: Secondary | ICD-10-CM | POA: Diagnosis not present

## 2018-07-02 ENCOUNTER — Observation Stay
Admission: EM | Admit: 2018-07-02 | Discharge: 2018-07-04 | Disposition: A | Discharge status: Home / Self Care | Payer: Medicare Other | Attending: Internal Medicine | Admitting: Internal Medicine

## 2018-07-02 ENCOUNTER — Observation Stay: Payer: Medicare Other

## 2018-07-02 ENCOUNTER — Other Ambulatory Visit: Payer: Self-pay

## 2018-07-02 ENCOUNTER — Encounter: Payer: Self-pay | Admitting: Emergency Medicine

## 2018-07-02 ENCOUNTER — Emergency Department: Payer: Medicare Other

## 2018-07-02 DIAGNOSIS — R4701 Aphasia: Secondary | ICD-10-CM | POA: Insufficient documentation

## 2018-07-02 DIAGNOSIS — Z87891 Personal history of nicotine dependence: Secondary | ICD-10-CM | POA: Diagnosis not present

## 2018-07-02 DIAGNOSIS — I6523 Occlusion and stenosis of bilateral carotid arteries: Secondary | ICD-10-CM | POA: Diagnosis not present

## 2018-07-02 DIAGNOSIS — I5022 Chronic systolic (congestive) heart failure: Secondary | ICD-10-CM | POA: Insufficient documentation

## 2018-07-02 DIAGNOSIS — Z880 Allergy status to penicillin: Secondary | ICD-10-CM | POA: Diagnosis not present

## 2018-07-02 DIAGNOSIS — I480 Paroxysmal atrial fibrillation: Secondary | ICD-10-CM | POA: Diagnosis not present

## 2018-07-02 DIAGNOSIS — I110 Hypertensive heart disease with heart failure: Secondary | ICD-10-CM | POA: Diagnosis not present

## 2018-07-02 DIAGNOSIS — F418 Other specified anxiety disorders: Secondary | ICD-10-CM | POA: Diagnosis not present

## 2018-07-02 DIAGNOSIS — Z888 Allergy status to other drugs, medicaments and biological substances status: Secondary | ICD-10-CM | POA: Diagnosis not present

## 2018-07-02 DIAGNOSIS — R262 Difficulty in walking, not elsewhere classified: Secondary | ICD-10-CM | POA: Insufficient documentation

## 2018-07-02 DIAGNOSIS — Z8674 Personal history of sudden cardiac arrest: Secondary | ICD-10-CM | POA: Insufficient documentation

## 2018-07-02 DIAGNOSIS — Z7901 Long term (current) use of anticoagulants: Secondary | ICD-10-CM | POA: Diagnosis not present

## 2018-07-02 DIAGNOSIS — G459 Transient cerebral ischemic attack, unspecified: Principal | ICD-10-CM | POA: Insufficient documentation

## 2018-07-02 DIAGNOSIS — E119 Type 2 diabetes mellitus without complications: Secondary | ICD-10-CM | POA: Diagnosis not present

## 2018-07-02 DIAGNOSIS — Z79899 Other long term (current) drug therapy: Secondary | ICD-10-CM | POA: Insufficient documentation

## 2018-07-02 DIAGNOSIS — Z7982 Long term (current) use of aspirin: Secondary | ICD-10-CM | POA: Diagnosis not present

## 2018-07-02 DIAGNOSIS — Z95810 Presence of automatic (implantable) cardiac defibrillator: Secondary | ICD-10-CM | POA: Diagnosis not present

## 2018-07-02 DIAGNOSIS — I4891 Unspecified atrial fibrillation: Secondary | ICD-10-CM | POA: Diagnosis not present

## 2018-07-02 DIAGNOSIS — R4781 Slurred speech: Secondary | ICD-10-CM | POA: Diagnosis not present

## 2018-07-02 DIAGNOSIS — R29810 Facial weakness: Secondary | ICD-10-CM | POA: Diagnosis not present

## 2018-07-02 DIAGNOSIS — K219 Gastro-esophageal reflux disease without esophagitis: Secondary | ICD-10-CM | POA: Insufficient documentation

## 2018-07-02 DIAGNOSIS — E785 Hyperlipidemia, unspecified: Secondary | ICD-10-CM | POA: Diagnosis not present

## 2018-07-02 LAB — COMPREHENSIVE METABOLIC PANEL
ALT: 14 U/L (ref 0–44)
AST: 19 U/L (ref 15–41)
Albumin: 3.7 g/dL (ref 3.5–5.0)
Alkaline Phosphatase: 103 U/L (ref 38–126)
Anion gap: 6 (ref 5–15)
BUN: 21 mg/dL (ref 8–23)
CHLORIDE: 106 mmol/L (ref 98–111)
CO2: 29 mmol/L (ref 22–32)
CREATININE: 0.84 mg/dL (ref 0.44–1.00)
Calcium: 9 mg/dL (ref 8.9–10.3)
Glucose, Bld: 100 mg/dL — ABNORMAL HIGH (ref 70–99)
POTASSIUM: 3.9 mmol/L (ref 3.5–5.1)
Sodium: 141 mmol/L (ref 135–145)
Total Bilirubin: 0.5 mg/dL (ref 0.3–1.2)
Total Protein: 6.8 g/dL (ref 6.5–8.1)

## 2018-07-02 LAB — GLUCOSE, CAPILLARY: GLUCOSE-CAPILLARY: 98 mg/dL (ref 70–99)

## 2018-07-02 LAB — CBC
HEMATOCRIT: 36.9 % (ref 35.0–47.0)
Hemoglobin: 12.5 g/dL (ref 12.0–16.0)
MCH: 28.9 pg (ref 26.0–34.0)
MCHC: 33.9 g/dL (ref 32.0–36.0)
MCV: 85.3 fL (ref 80.0–100.0)
Platelets: 230 10*3/uL (ref 150–440)
RBC: 4.33 MIL/uL (ref 3.80–5.20)
RDW: 13.9 % (ref 11.5–14.5)
WBC: 6.1 10*3/uL (ref 3.6–11.0)

## 2018-07-02 LAB — DIFFERENTIAL
BASOS PCT: 1 %
Basophils Absolute: 0 10*3/uL (ref 0–0.1)
EOS ABS: 0.3 10*3/uL (ref 0–0.7)
Eosinophils Relative: 5 %
Lymphocytes Relative: 20 %
Lymphs Abs: 1.2 10*3/uL (ref 1.0–3.6)
MONO ABS: 0.3 10*3/uL (ref 0.2–0.9)
MONOS PCT: 6 %
Neutro Abs: 4.2 10*3/uL (ref 1.4–6.5)
Neutrophils Relative %: 68 %

## 2018-07-02 LAB — PROTIME-INR
INR: 0.99
Prothrombin Time: 13 seconds (ref 11.4–15.2)

## 2018-07-02 LAB — TROPONIN I

## 2018-07-02 LAB — APTT: aPTT: 30 seconds (ref 24–36)

## 2018-07-02 MED ORDER — ASPIRIN 325 MG PO TABS
325.0000 mg | ORAL_TABLET | Freq: Every day | ORAL | Status: DC
  Administered 2018-07-03 – 2018-07-04 (×2): 325 mg via ORAL
  Filled 2018-07-02 (×5): qty 1

## 2018-07-02 MED ORDER — STROKE: EARLY STAGES OF RECOVERY BOOK: Freq: Once | Status: DC

## 2018-07-02 MED ORDER — ACETAMINOPHEN 650 MG RE SUPP: 650.0000 mg | RECTAL | Status: DC | PRN

## 2018-07-02 MED ORDER — IOHEXOL 350 MG/ML SOLN
75.0000 mL | Freq: Once | INTRAVENOUS | Status: AC | PRN
  Administered 2018-07-02: 75 mL via INTRAVENOUS

## 2018-07-02 MED ORDER — HYDROCORTISONE NA SUCCINATE PF 250 MG IJ SOLR
200.0000 mg | Freq: Once | INTRAMUSCULAR | Status: DC
  Filled 2018-07-02: qty 200

## 2018-07-02 MED ORDER — DIPHENHYDRAMINE HCL 50 MG/ML IJ SOLN
50.0000 mg | Freq: Once | INTRAMUSCULAR | Status: AC
  Administered 2018-07-02: 50 mg via INTRAVENOUS
  Filled 2018-07-02: qty 1

## 2018-07-02 MED ORDER — ASPIRIN 300 MG RE SUPP: 300.0000 mg | Freq: Every day | RECTAL | Status: DC

## 2018-07-02 MED ORDER — ACETAMINOPHEN 160 MG/5ML PO SOLN
650.0000 mg | ORAL | Status: DC | PRN
  Filled 2018-07-02: qty 20.3

## 2018-07-02 MED ORDER — METHYLPREDNISOLONE SODIUM SUCC 40 MG IJ SOLR
40.0000 mg | Freq: Once | INTRAMUSCULAR | Status: AC
  Administered 2018-07-02: 40 mg via INTRAVENOUS
  Filled 2018-07-02: qty 1

## 2018-07-02 MED ORDER — DIPHENHYDRAMINE HCL 50 MG/ML IJ SOLN: 50.0000 mg | Freq: Once | INTRAMUSCULAR | Status: DC

## 2018-07-02 MED ORDER — ACETAMINOPHEN 325 MG PO TABS
650.0000 mg | ORAL_TABLET | ORAL | Status: DC | PRN
  Filled 2018-07-02: qty 2

## 2018-07-02 MED ORDER — ENOXAPARIN SODIUM 40 MG/0.4ML ~~LOC~~ SOLN
40.0000 mg | SUBCUTANEOUS | Status: DC
  Administered 2018-07-02: 23:00:00 40 mg via SUBCUTANEOUS
  Filled 2018-07-02: qty 0.4

## 2018-07-02 NOTE — H&P (Signed)
[facility] [place] NAME: Anna Marsh    MR#:  [id number]  DATE OF BIRTH:  July 17, 1946  DATE OF ADMISSION:  07/02/2018  PRIMARY CARE PHYSICIAN: Jerrol Banana., MD   REQUESTING/REFERRING PHYSICIAN: Merlyn Lot, MD  CHIEF COMPLAINT:   Chief Complaint  Patient presents with  . Aphasia    HISTORY OF PRESENT ILLNESS: Anna Marsh  is a 72 y.o. female with a known history of AICD, pacemaker, anxiety, congestive heart failure, GERD, hypercholesteremia, essential hypertension who is presenting with complaint of a aphasia.  According to patient and her daughter she has not been feeling well recently.  She passed out last week.  And was noted to have a UTI.  Patient in the ER CT scan of the head was negative.  She currently continues to have some expressive aphasia. Patient also had a carotid Doppler yesterday which shows left-sided carotid artery stenosis.  In the ER her pacemaker defibrillator was interrogated and is showing that patient is having episodes of atrial fibrillation.    PAST MEDICAL HISTORY:   Past Medical History:  Diagnosis Date  . AICD (automatic cardioverter/defibrillator) present   . Anxiety   . AV block, complete (HCC)   . Cervical radiculopathy   . CHF (congestive heart failure) ([place])   . Chronic systolic heart failure ([place])   . GERD (gastroesophageal reflux disease)   . History of cardiac arrest   . Hypercholesteremia   . Hypertension   . Pre-diabetes   . 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 Right arm weakness     PAST SURGICAL HISTORY:  Past Surgical History:  Procedure Laterality Date  . ABDOMINAL HYSTERECTOMY    . CATARACT EXTRACTION W/PHACO Left 12/18/2016   Procedure: CATARACT EXTRACTION PHACO AND INTRAOCULAR LENS PLACEMENT ([place])  left;  Surgeon: Ronnell Freshwater, MD;  Location: [place];  Service: Ophthalmology;  Laterality: Left;  . CATARACT  EXTRACTION W/PHACO Right 01/01/2017   Procedure: CATARACT EXTRACTION PHACO AND INTRAOCULAR LENS PLACEMENT ([place])  Right;  Surgeon: Ronnell Freshwater, MD;  Location: [place];  Service: Ophthalmology;  Laterality: Right;  . HERNIA REPAIR     umbilical x 2  . NECK SURGERY    . PACEMAKER IMPLANT  02/03/2015   [facility]  . TONSILLECTOMY      SOCIAL HISTORY:  Social History   Tobacco Use  . Smoking status: Former Smoker    Packs/day: 0.50    Years: 20.00    Pack years: 10.00    Last attempt to quit: 10/03/2003    Years since quitting: 14.7  . Smokeless tobacco: Never Used  Substance Use Topics  . Alcohol use: No    FAMILY HISTORY:  Family History  Problem Relation Age of Onset  . Cancer Mother   . Hyperlipidemia Father   . Heart disease Father   . Vascular Disease Father   . Arthritis Sister   . Colon cancer Sister   . Migraines Brother   . Alcohol abuse Sister   . Dementia Sister   . Bipolar disorder Sister   . Heart attack Sister        around age 32  . Arthritis Sister   . Migraines Sister   . Breast cancer Neg Hx     DRUG ALLERGIES:  Allergies  Allergen Reactions  . Iodinated Diagnostic Agents Other (See Comments)    Causes migraines  . Iodine  Contrast Dye causes migraines  . Lisinopril Cough  . Penicillins Rash    REVIEW OF SYSTEMS:   CONSTITUTIONAL: No fever, fatigue or weakness.  EYES: No blurred or double vision.  EARS, NOSE, AND THROAT: No tinnitus or ear pain.  RESPIRATORY: No cough, shortness of breath, wheezing or hemoptysis.  CARDIOVASCULAR: No chest pain, orthopnea, edema.  GASTROINTESTINAL: No nausea, vomiting, diarrhea or abdominal pain.  GENITOURINARY: No dysuria, hematuria.  ENDOCRINE: No polyuria, nocturia,  HEMATOLOGY: No anemia, easy bruising or bleeding SKIN: No rash or lesion. MUSCULOSKELETAL: No joint pain or arthritis.   NEUROLOGIC: No tingling, numbness, positive weakness.  Positive expressive aphasia PSYCHIATRY: No  anxiety or depression.   MEDICATIONS AT HOME:  Prior to Admission medications   Medication Sig Start Date End Date Taking? Authorizing Provider  aspirin EC 325 MG tablet Take 1 tablet (325 mg total) by mouth daily. 12/05/16   Jerrol Banana., MD  atorvastatin (LIPITOR) 80 MG tablet TAKE 1 TABLET BY MOUTH  DAILY 07/26/17   Jerrol Banana., MD  butalbital-acetaminophen-caffeine (FIORICET, ESGIC) [phone] MG tablet Take 1 tablet by mouth every 4 (four) hours as needed for headache. 08/26/17   Paulette Blanch, MD  cyanocobalamin 1000 MCG tablet Take by mouth.    [provider]  ENTRESTO 24-26 MG  09/13/15   [provider]  furosemide (LASIX) 40 MG tablet TAKE 1 TABLET BY MOUTH  DAILY 07/26/17   Jerrol Banana., MD  gabapentin (NEURONTIN) 100 MG capsule Take 100 mg by mouth at bedtime.    [provider]  loratadine (CLARITIN) 10 MG tablet Take by mouth.    [provider]  metoprolol tartrate (LOPRESSOR) 25 MG tablet Take 1 tablet (25 mg total) by mouth 2 (two) times daily. 03/20/18   Jerrol Banana., MD  MULTIPLE VITAMIN PO Take by mouth.    [provider]  nitroGLYCERIN (NITROSTAT) 0.4 MG SL tablet Place 1 tablet (0.4 mg total) under the tongue every 5 (five) minutes as needed for chest pain. 12/05/16 12/05/17  Jerrol Banana., MD  omeprazole (PRILOSEC) 20 MG capsule TAKE 1 CAPSULE BY MOUTH  DAILY 05/20/18   Jerrol Banana., MD  ondansetron (ZOFRAN ODT) 4 MG disintegrating tablet Take 1 tablet (4 mg total) by mouth every 8 (eight) hours as needed for nausea or vomiting. 08/26/17   Paulette Blanch, MD  potassium chloride (K-DUR,KLOR-CON) 10 MEQ tablet TAKE 1 TABLET BY MOUTH TWO  TIMES DAILY 05/20/18   Jerrol Banana., MD  sertraline (ZOLOFT) 50 MG tablet TAKE 1 TABLET BY MOUTH  DAILY 04/20/17   Jerrol Banana., MD      PHYSICAL EXAMINATION:   VITAL SIGNS: Blood pressure (!) 137/45, pulse (!) 59, temperature  98.4 F (36.9 C), temperature source Oral, resp. rate 18, height 5\' 2"  (1.575 m), weight 113.4 kg, SpO2 97 %.  GENERAL:  72 y.o.-year-old patient lying in the bed with no acute distress.  EYES: Pupils equal, round, reactive to light and accommodation. No scleral icterus. Extraocular muscles intact.  HEENT: Head atraumatic, normocephalic. Oropharynx and nasopharynx clear.  NECK:  Supple, no jugular venous distention. No thyroid enlargement, no tenderness.  LUNGS: Normal breath sounds bilaterally, no wheezing, rales,rhonchi or crepitation. No use of accessory muscles of respiration.  CARDIOVASCULAR: S1, S2 normal. No murmurs, rubs, or gallops.  ABDOMEN: Soft, nontender, nondistended. Bowel sounds present. No organomegaly or mass.  EXTREMITIES: No pedal edema, cyanosis, or  clubbing.  NEUROLOGIC: Cranial nerves II through XII are intact. Muscle strength 5/5 in all extremities. Sensation intact. Gait not checked.  PSYCHIATRIC: The patient is alert and oriented x 3.  SKIN: No obvious rash, lesion, or ulcer.   LABORATORY PANEL:   CBC Recent Labs  Lab 07/02/18 1139  WBC 6.1  HGB 12.5  HCT 36.9  PLT 230  MCV 85.3  MCH 28.9  MCHC 33.9  RDW 13.9  LYMPHSABS 1.2  MONOABS 0.3  EOSABS 0.3  BASOSABS 0.0   ------------------------------------------------------------------------------------------------------------------  Chemistries  Recent Labs  Lab 07/02/18 1139  NA 141  K 3.9  CL 106  CO2 29  GLUCOSE 100*  BUN 21  CREATININE 0.84  CALCIUM 9.0  AST 19  ALT 14  ALKPHOS 103  BILITOT 0.5   ------------------------------------------------------------------------------------------------------------------ estimated creatinine clearance is 72.1 mL/min (by C-G formula based on SCr of 0.84 mg/dL). ------------------------------------------------------------------------------------------------------------------ No results for input(s): TSH, T4TOTAL, T3FREE, THYROIDAB in the last 72  hours.  Invalid input(s): FREET3   Coagulation profile Recent Labs  Lab 07/02/18 1139  INR 0.99   ------------------------------------------------------------------------------------------------------------------- No results for input(s): DDIMER in the last 72 hours. -------------------------------------------------------------------------------------------------------------------  Cardiac Enzymes Recent Labs  Lab 07/02/18 1139  TROPONINI <0.03   ------------------------------------------------------------------------------------------------------------------ Invalid input(s): POCBNP  ---------------------------------------------------------------------------------------------------------------  Urinalysis    Component Value Date/Time   COLORURINE YELLOW (A) 11/07/2016 2348   APPEARANCEUR CLEAR (A) 11/07/2016 2348   LABSPEC 1.011 11/07/2016 2348   PHURINE 6.0 11/07/2016 2348   GLUCOSEU NEGATIVE 11/07/2016 2348   HGBUR NEGATIVE 11/07/2016 2348   BILIRUBINUR NEGATIVE 11/07/2016 2348   KETONESUR NEGATIVE 11/07/2016 2348   PROTEINUR NEGATIVE 11/07/2016 2348   NITRITE NEGATIVE 11/07/2016 2348   LEUKOCYTESUR NEGATIVE 11/07/2016 2348     RADIOLOGY: Ct Head Wo Contrast  Result Date: 07/02/2018 CLINICAL DATA:  Sudden onset of left-sided facial droop and slurred speech which has resolved EXAM: CT HEAD WITHOUT CONTRAST TECHNIQUE: Contiguous axial images were obtained from the base of the skull through the vertex without intravenous contrast. COMPARISON:  CT brain scan 06/27/2018 FINDINGS: Brain: The ventricular system is prominent but stable. Mild cortical atrophy also is present. The septum is midline in position. The fourth ventricle and basilar cisterns are unremarkable. No hemorrhage, mass lesion, or acute infarction is noted. Mild small vessel ischemic change is difficult to exclude. Vascular: No vascular and on this unenhanced study. Skull: On bone window images, no calvarial  fracture is seen. Sinuses/Orbits: The paranasal sinuses are well pneumatized. Other: None. IMPRESSION: Mild atrophy and possibly mild small vessel ischemic change. No acute intracranial abnormality. Electronically Signed   By: Ivar Drape M.D.   On: 07/02/2018 11:59   US Carotid Duplex Bilateral  Result Date: 07/01/2018 CLINICAL DATA:  Syncope and collapse, hypertension and visual disturbance EXAM: BILATERAL CAROTID DUPLEX ULTRASOUND TECHNIQUE: Pearline Cables scale imaging, color Doppler and duplex ultrasound were performed of bilateral carotid and vertebral arteries in the neck. COMPARISON:  06/27/2018 head CT FINDINGS: Criteria: Quantification of carotid stenosis is based on velocity parameters that correlate the residual internal carotid diameter with NASCET-based stenosis levels, using the diameter of the distal internal carotid lumen as the denominator for stenosis measurement. The following velocity measurements were obtained: RIGHT ICA: 69/15 cm/sec CCA: 16/1 cm/sec SYSTOLIC ICA/CCA RATIO:  1.6 ECA: 124 cm/sec LEFT ICA: 206/38 cm/sec CCA: 096/04 cm/sec SYSTOLIC ICA/CCA RATIO:  1.2 ECA: 105 cm/sec RIGHT CAROTID ARTERY: Heterogeneous moderate and partially calcified right carotid bifurcation atherosclerosis. Despite this, there is no hemodynamically significant right ICA  stenosis, velocity elevation, or turbulent flow. Degree of narrowing estimated less than 50% by ultrasound criteria. RIGHT VERTEBRAL ARTERY:  Antegrade LEFT CAROTID ARTERY: More pronounced irregular calcified right carotid bifurcation atherosclerosis. Left ICA lumen is narrowed proximally. In this region, there is mild left proximal ICA velocity elevation measures 206/38 centimeters/second without significant turbulent flow. By ultrasound criteria, left ICA stenosis estimated at 50-69% (closer to the 50% range). LEFT VERTEBRAL ARTERY:  Antegrade IMPRESSION: Moderate bilateral carotid atherosclerosis. Right ICA narrowing less than 50% Moderate left  ICA stenosis estimated at 50-69% (closer to the 50% range). Electronically Signed   By: Jerilynn Mages.  Shick M.D.   On: 07/01/2018 14:34    EKG: Orders placed or performed during the hospital encounter of 07/02/18  . ED EKG  . ED EKG  . EKG 12-Lead  . EKG 12-Lead    IMPRESSION AND PLAN: Patient 72 year old presenting with expressive aphasia  1.  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  Hyperlipidemia continue Lipitor at 80 mg lipid panel in the morning  3.  Chronic systolic CHF currently compensated continue Lasix Continue Entresto 4.  Depression anxiety continue Zoloft  5.  GERD continue PPIs  6.  Miscellaneous patient will need Lovenox until decide about full dose anticoagulation        All the records are reviewed and case discussed with ED provider. Management plans discussed with the patient, family and they are in agreement.  CODE STATUS: Code Status History    Date Active Date Inactive Code Status Order ID Comments User Context   11/07/2016 2347 11/09/2016 1428 Full Code [id number]  Hugelmeyer, Ubaldo Glassing, DO Inpatient       TOTAL TIME TAKING CARE OF THIS PATIENT:9minutes.    Dustin Flock M.D on 07/02/2018 at 1:23 PM  Between 7am to 6pm - Pager - [phone]  After 6pm go to [email or web address] - password [email or web address] [facility]  [phone]  CC: Primary care physician; Jerrol Banana., MD

## 2018-07-02 NOTE — Progress Notes (Signed)
Advanced care plan.  Purpose of the Encounter: CODE STATUS  Parties in Attendance: Patient herself  Patient's Decision Capacity: Intact  Subjective/Patient's story: Patient 72 year old with history of chronic systolic CHF, essential hypertension, hyperlipidemia, GERD presenting with strokelike symptoms    Objective/Medical story I discussed with the patient regarding her desires for cardiac and pulmonary resuscitation   Goals of care determination:  Patient states that she would like everything done and wants to be a full code for now   CODE STATUS: Full code   Time spent discussing advanced care planning: 16 minutes

## 2018-07-02 NOTE — ED Notes (Signed)
First Nurse Note: Daughter states a nurse in [place] stopped them in [place] this AM and said "You need to go to the ED right now, she's having a stroke".  Slurred speech approx. 15 min ago per daughter.

## 2018-07-02 NOTE — ED Notes (Signed)
Tried to call report.  Nurse in isolation- will call me back.

## 2018-07-02 NOTE — ED Provider Notes (Signed)
[facility] Emergency Department Provider Note    First MD Initiated Contact with Patient 07/02/18 1252     (approximate)  I have reviewed the triage vital signs and the nursing notes.   HISTORY  Chief Complaint Aphasia    HPI Anna Marsh is a 72 y.o. female with below listed past medical history currently on full dose aspirin but no other anti-platelet or anticoagulant therapy presents the ER for evaluation of dysarthria slurred speech and left facial droop that occurred around 1045 while the patient was at [facility] today.  Denies any chest pain or headaches.  No nausea or vomiting.  States that family have noticed intermittent slurred speech and dysarthria several times over the past week and also noticed it last night and this morning.  She denies any numbness or tingling.  States the symptoms lasted roughly 10 minutes today.  Did have recent outpatient evaluation for syncope as well as possible TIA.  Had carotid ultrasound performed as well as CT imaging.    Past Medical History:  Diagnosis Date  . AICD (automatic cardioverter/defibrillator) present   . Anxiety   . AV block, complete (HCC)   . Cervical radiculopathy   . CHF (congestive heart failure) ([place])   . Chronic systolic heart failure ([place])   . GERD (gastroesophageal reflux disease)   . History of cardiac arrest   . Hypercholesteremia   . Hypertension   . Pre-diabetes   . 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 Right arm weakness    Family History  Problem Relation Age of Onset  . Cancer Mother   . Hyperlipidemia Father   . Heart disease Father   . Vascular Disease Father   . Arthritis Sister   . Colon cancer Sister   . Migraines Brother   . Alcohol abuse Sister   . Dementia Sister   . Bipolar disorder Sister   . Heart attack Sister        around age 67  . Arthritis Sister   . Migraines Sister   . Breast cancer Neg Hx    Past Surgical  History:  Procedure Laterality Date  . ABDOMINAL HYSTERECTOMY    . CATARACT EXTRACTION W/PHACO Left 12/18/2016   Procedure: CATARACT EXTRACTION PHACO AND INTRAOCULAR LENS PLACEMENT ([place])  left;  Surgeon: Ronnell Freshwater, MD;  Location: [place];  Service: Ophthalmology;  Laterality: Left;  . CATARACT EXTRACTION W/PHACO Right 01/01/2017   Procedure: CATARACT EXTRACTION PHACO AND INTRAOCULAR LENS PLACEMENT ([place])  Right;  Surgeon: Ronnell Freshwater, MD;  Location: [place];  Service: Ophthalmology;  Laterality: Right;  . HERNIA REPAIR     umbilical x 2  . NECK SURGERY    . PACEMAKER IMPLANT  02/03/2015   [facility]  . TONSILLECTOMY     Patient Active Problem List   Diagnosis Date Noted  . TIA (transient ischemic attack) 11/07/2016  . Pre-diabetes 07/20/2015  . Acute anxiety 07/20/2015  . 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 GERD (gastroesophageal reflux disease) 02/28/2015  . Hypercholesterolemia 02/28/2015  . Hypertension 02/28/2015  . Cardiac defibrillator in place 02/28/2015  . Block, bundle branch, left 02/28/2015  . Adiposity 02/28/2015  . Acid reflux 02/28/2015  . 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 Chronic systolic CHF (congestive heart failure), NYHA class 3 ([place]) 02/24/2015  . H/O cardiac catheterization 02/24/2015  . 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 Cardiomyopathy ([place]) 02/01/2015  .  Angina pectoris ([place]) 12/25/2013  . Arthritis 12/25/2013  . 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 Authorizing Provider  aspirin EC 325 MG tablet Take 1 tablet (325 mg total) by mouth daily. 12/05/16   Jerrol Banana., MD  atorvastatin (LIPITOR) 80 MG tablet TAKE 1 TABLET BY MOUTH  DAILY 07/26/17   Jerrol Banana., MD  butalbital-acetaminophen-caffeine (FIORICET, ESGIC) 408 250 2819 MG tablet Take 1 tablet by mouth every  4 (four) hours as needed for headache. 08/26/17   Paulette Blanch, MD  cyanocobalamin 1000 MCG tablet Take by mouth.    [provider]  ENTRESTO 24-26 MG  09/13/15   [provider]  furosemide (LASIX) 40 MG tablet TAKE 1 TABLET BY MOUTH  DAILY 07/26/17   Jerrol Banana., MD  gabapentin (NEURONTIN) 100 MG capsule Take 100 mg by mouth at bedtime.    [provider]  loratadine (CLARITIN) 10 MG tablet Take by mouth.    [provider]  metoprolol tartrate (LOPRESSOR) 25 MG tablet Take 1 tablet (25 mg total) by mouth 2 (two) times daily. 03/20/18   Jerrol Banana., MD  MULTIPLE VITAMIN PO Take by mouth.    [provider]  nitroGLYCERIN (NITROSTAT) 0.4 MG SL tablet Place 1 tablet (0.4 mg total) under the tongue every 5 (five) minutes as needed for chest pain. 12/05/16 12/05/17  Jerrol Banana., MD  omeprazole (PRILOSEC) 20 MG capsule TAKE 1 CAPSULE BY MOUTH  DAILY 05/20/18   Jerrol Banana., MD  ondansetron (ZOFRAN ODT) 4 MG disintegrating tablet Take 1 tablet (4 mg total) by mouth every 8 (eight) hours as needed for nausea or vomiting. 08/26/17   Paulette Blanch, MD  potassium chloride (K-DUR,KLOR-CON) 10 MEQ tablet TAKE 1 TABLET BY MOUTH TWO  TIMES DAILY 05/20/18   Jerrol Banana., MD  sertraline (ZOLOFT) 50 MG tablet TAKE 1 TABLET BY MOUTH  DAILY 04/20/17   Jerrol Banana., MD    Allergies Iodinated diagnostic agents; Iodine; Lisinopril; and Penicillins    Social History Social History   Tobacco Use  . Smoking status: Former Smoker    Packs/day: 0.50    Years: 20.00    Pack years: 10.00    Last attempt to quit: 10/03/2003    Years since quitting: 14.7  . Smokeless tobacco: Never Used  Substance Use Topics  . Alcohol use: No  . Drug use: No    Review of Systems Patient denies headaches, rhinorrhea, blurry vision, numbness, shortness of breath, chest pain, edema, cough, abdominal pain, nausea, vomiting,  diarrhea, dysuria, fevers, rashes or hallucinations unless otherwise stated above in HPI. ____________________________________________   PHYSICAL EXAM:  VITAL SIGNS: Vitals:   07/02/18 1121  BP: (!) 137/45  Pulse: (!) 59  Resp: 18  Temp: 98.4 F (36.9 C)  SpO2: 97%    Constitutional: Alert and oriented.  Eyes: Conjunctivae are normal.  Head: Atraumatic. Nose: No congestion/rhinnorhea. Mouth/Throat: Mucous membranes are moist.   Neck: No stridor. Painless ROM.  Cardiovascular: Normal rate, regular rhythm. Grossly normal heart sounds.  Good peripheral circulation. Respiratory: Normal respiratory effort.  No retractions. Lungs CTAB. Gastrointestinal: Soft and nontender. No distention. No abdominal bruits. No CVA tenderness. Genitourinary:  Musculoskeletal: No lower extremity tenderness nor edema.  No joint effusions. Neurologic:  Slowed speech according to family but normal language without word finding difficulties. No  gross focal neurologic deficits are appreciated. No facial droop Skin:  Skin is warm, dry and intact. No rash noted. Psychiatric: Mood and affect are normal. Speech and behavior are normal.  ____________________________________________   LABS (all labs ordered are listed, but only abnormal results are displayed)  Results for orders placed or performed during the hospital encounter of 07/02/18 (from the past 24 hour(s))  Protime-INR     Status: None   Collection Time: 07/02/18 11:39 AM  Result Value Ref Range   Prothrombin Time 13.0 11.4 - 15.2 seconds   INR 0.99   APTT     Status: None   Collection Time: 07/02/18 11:39 AM  Result Value Ref Range   aPTT 30 24 - 36 seconds  CBC     Status: None   Collection Time: 07/02/18 11:39 AM  Result Value Ref Range   WBC 6.1 3.6 - 11.0 K/uL   RBC 4.33 3.80 - 5.20 MIL/uL   Hemoglobin 12.5 12.0 - 16.0 g/dL   HCT 36.9 35.0 - 47.0 %   MCV 85.3 80.0 - 100.0 fL   MCH 28.9 26.0 - 34.0 pg   MCHC 33.9 32.0 - 36.0 g/dL     RDW 13.9 11.5 - 14.5 %   Platelets 230 150 - 440 K/uL  Differential     Status: None   Collection Time: 07/02/18 11:39 AM  Result Value Ref Range   Neutrophils Relative % 68 %   Neutro Abs 4.2 1.4 - 6.5 K/uL   Lymphocytes Relative 20 %   Lymphs Abs 1.2 1.0 - 3.6 K/uL   Monocytes Relative 6 %   Monocytes Absolute 0.3 0.2 - 0.9 K/uL   Eosinophils Relative 5 %   Eosinophils Absolute 0.3 0 - 0.7 K/uL   Basophils Relative 1 %   Basophils Absolute 0.0 0 - 0.1 K/uL  Comprehensive metabolic panel     Status: Abnormal   Collection Time: 07/02/18 11:39 AM  Result Value Ref Range   Sodium 141 135 - 145 mmol/L   Potassium 3.9 3.5 - 5.1 mmol/L   Chloride 106 98 - 111 mmol/L   CO2 29 22 - 32 mmol/L   Glucose, Bld 100 (H) 70 - 99 mg/dL   BUN 21 8 - 23 mg/dL   Creatinine, Ser 0.84 0.44 - 1.00 mg/dL   Calcium 9.0 8.9 - 10.3 mg/dL   Total Protein 6.8 6.5 - 8.1 g/dL   Albumin 3.7 3.5 - 5.0 g/dL   AST 19 15 - 41 U/L   ALT 14 0 - 44 U/L   Alkaline Phosphatase 103 38 - 126 U/L   Total Bilirubin 0.5 0.3 - 1.2 mg/dL   GFR calc non Af Amer >60 >60 mL/min   GFR calc Af Amer >60 >60 mL/min   Anion gap 6 5 - 15  Troponin I     Status: None   Collection Time: 07/02/18 11:39 AM  Result Value Ref Range   Troponin I <0.03 <0.03 ng/mL  Glucose, capillary     Status: None   Collection Time: 07/02/18 11:48 AM  Result Value Ref Range   Glucose-Capillary 98 70 - 99 mg/dL   Comment 1 Document in Chart    ____________________________________________  EKG My review and personal interpretation at Time: 11:36   Indication: tia  Rate: 55  Rhythm: a-v paced Axis: normal Other: normal intervals, no stemi ____________________________________________  RADIOLOGY  I personally reviewed all radiographic images ordered to evaluate for the above acute complaints and reviewed  radiology reports and findings.  These findings were personally discussed with the patient.  Please see medical record for radiology  report.  ____________________________________________   PROCEDURES  Procedure(s) performed:  Procedures    Critical Care performed: no ____________________________________________   INITIAL IMPRESSION / ASSESSMENT AND PLAN / ED COURSE  Pertinent labs & imaging results that were available during my care of the patient were reviewed by me and considered in my medical decision making (see chart for details).   DDX: cva, tia, hypoglycemia, dehydration, electrolyte abnormality, dissection, sepsis   Anna Marsh is a 72 y.o. who presents to the ED with symptoms as described above.  Not code stroke as the symptoms have resolved.  Certainly concerning for TIA.  Patient with extensive comorbidities and at high risk for CVA.  Doubt infectious process.  Not consistent with seizure or metabolic process.  CT head shows no evidence of bleed but does show chronic microvascular changes.  Clinical Course as of Jul 02 1325  Tue Jul 02, 2018  1320 ICD report shows short runs of Afib.  "Roanna Banning, RN 7/31/20199:09 AM REMOTE ICD AND S-ICD CHECK Date/Time: 04/30/2018 9:05 AM Performed by: Roanna Banning, RN Authorized by: Michelle Piper, MD  Comments: Normal device function, Tested Lead measurements stable and  within normal range, Adequate battery reserve No significant new ventricular arrhythmias, 1 ATR episode 2 seconds long  EGM shows a short run of AF. "   [PR]  1325 As the patient does have probable paroxysmal A. fib not on anticoagulation do feel patient will require hospitalization for further medical management.  Have discussed with the patient and available family all diagnostics and treatments performed thus far and all questions were answered to the best of my ability. The patient demonstrates understanding and agreement with plan.    [PR]    Clinical Course User Index [PR] Merlyn Lot, MD     As part of my medical decision making, I reviewed the  following data within the [place] notes reviewed and incorporated, Labs reviewed, notes from prior ED visits.  ____________________________________________   FINAL CLINICAL IMPRESSION(S) / ED DIAGNOSES  Final diagnoses:  TIA (transient ischemic attack)  Paroxysmal A-fib ([place])      NEW MEDICATIONS STARTED DURING THIS VISIT:  New Prescriptions   No medications on file     Note:  This document was prepared using Dragon voice recognition software and may include unintentional dictation errors.    Merlyn Lot, MD 07/02/18 1326

## 2018-07-02 NOTE — ED Notes (Signed)
Pt assisted to bathroom at this time. She was able to ambulate with stand by assist.

## 2018-07-02 NOTE — Consult Note (Signed)
Referring Physician: Dustin Flock, MD    Chief Complaint: Slurred speech and left facial droop  HPI: Anna Marsh is an 72 y.o. female with pertinent history of chronic systolic congestive heart failure s/p AICD with biventricular pacing, hyperlipidemia, hypertension and prediabetes presenting to the ED with episode of slurred speech and left-sided facial droop.  Patient's daughter who is the other informant today state that patient was at [facility] on 07/02/2018 shopping when the episode occurred.  Daughter states that at around 1045 she noticed that patient was slurring her speech with mouth twisted to the left, she was also unable to get her words out clearly.  Per patient's daughter, he has had a similar episode in the past 2 weeks occurring multiple times but resolving within few minutes without focal neurologic deficit.  She also reports an episode of confusion and abnormal movement of the left hand occurring at the same time as her mouth twitching.  Patient denies loss of consciousness during this episode but states that she bit her lips several times, there was no loss of bladder control with this episodes.  Patient's daughter states that patient has been having episodes of getting choked when she eats or drinks.  She was recently evaluated by her PCP for the symptoms and an episode of syncope which occurred on 06/25/2018.  PCP was concerned of a possible stroke and ordered CT head and ultrasound of carotid for further evaluation.  She also followed up with her cardiology for the syncopal episode.  In the ED she had a repeat CT head which did not show acute intracranial abnormality.  Initial NIH stroke scale 2.  Date last known well: Date: 07/02/2018 Time last known well: Time: 10:45 tPA Given: No: Resolving symptoms  Past Medical History:  Diagnosis Date  . AICD (automatic cardioverter/defibrillator) present   . Anxiety   . AV block, complete (HCC)   . Cervical radiculopathy   . CHF  (congestive heart failure) ([place])   . Chronic systolic heart failure ([place])   . GERD (gastroesophageal reflux disease)   . History of cardiac arrest   . Hypercholesteremia   . Hypertension   . Pre-diabetes   . 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 Right arm weakness     Past Surgical History:  Procedure Laterality Date  . ABDOMINAL HYSTERECTOMY    . CATARACT EXTRACTION W/PHACO Left 12/18/2016   Procedure: CATARACT EXTRACTION PHACO AND INTRAOCULAR LENS PLACEMENT ([place])  left;  Surgeon: Ronnell Freshwater, MD;  Location: [place];  Service: Ophthalmology;  Laterality: Left;  . CATARACT EXTRACTION W/PHACO Right 01/01/2017   Procedure: CATARACT EXTRACTION PHACO AND INTRAOCULAR LENS PLACEMENT ([place])  Right;  Surgeon: Ronnell Freshwater, MD;  Location: [place];  Service: Ophthalmology;  Laterality: Right;  . HERNIA REPAIR     umbilical x 2  . NECK SURGERY    . PACEMAKER IMPLANT  02/03/2015   [facility]  . TONSILLECTOMY      Family History  Problem Relation Age of Onset  . Cancer Mother   . Hyperlipidemia Father   . Heart disease Father   . Vascular Disease Father   . Arthritis Sister   . Colon cancer Sister   . Migraines Brother   . Alcohol abuse Sister   . Dementia Sister   . Bipolar disorder Sister   . Heart attack Sister        around age 8  . Arthritis Sister   .  Migraines Sister   . Breast cancer Neg Hx    Social History:  reports that she quit smoking about 14 years ago. She has a 10.00 pack-year smoking history. She has never used smokeless tobacco. She reports that she does not drink alcohol or use drugs.  Allergies:  Allergies  Allergen Reactions  . Iodinated Diagnostic Agents Other (See Comments)    Causes migraines  . Iodine     Contrast Dye causes migraines  . Lisinopril Cough  . Penicillins Rash    Has patient had a PCN reaction causing immediate rash, facial/tongue/throat swelling, SOB or  lightheadedness with hypotension: Yes Has patient had a PCN reaction causing severe rash involving mucus membranes or skin necrosis: No Has patient had a PCN reaction that required hospitalization: No Has patient had a PCN reaction occurring within the last 10 years: Yes If all of the above answers are "NO", then may proceed with Cephalosporin use.    Medications:  I have reviewed the patient's current medications. Prior to Admission:  Medications Prior to Admission  Medication Sig Dispense Refill Last Dose  . aspirin EC 325 MG tablet Take 1 tablet (325 mg total) by mouth daily. 30 tablet 0 07/02/2018 at 0830  . atorvastatin (LIPITOR) 80 MG tablet TAKE 1 TABLET BY MOUTH  DAILY (Patient taking differently: Take 80 mg by mouth daily. ) 90 tablet 3 07/02/2018 at am  . butalbital-acetaminophen-caffeine (FIORICET, ESGIC) 50-325-40 MG tablet Take 1 tablet by mouth every 4 (four) hours as needed for headache. 20 tablet 0 unknown at unknown  . cyanocobalamin 1000 MCG tablet Take 1,000 mcg by mouth daily.    07/02/2018 at am  . furosemide (LASIX) 40 MG tablet TAKE 1 TABLET BY MOUTH  DAILY (Patient taking differently: Take 40 mg by mouth daily. ) 90 tablet 3 07/02/2018 at am  . gabapentin (NEURONTIN) 100 MG capsule Take 100 mg by mouth at bedtime.   07/01/2018 at pm  . loratadine (CLARITIN) 10 MG tablet Take 10 mg by mouth daily.    07/02/2018 at am  . metoprolol tartrate (LOPRESSOR) 25 MG tablet Take 1 tablet (25 mg total) by mouth 2 (two) times daily. 180 tablet 3 07/02/2018 at 08.0  . Multiple Vitamins-Minerals (MULTIVITAMIN WITH MINERALS) tablet Take 1 tablet by mouth daily.   07/02/2018 at am  . nitroGLYCERIN (NITROSTAT) 0.4 MG SL tablet Place 1 tablet (0.4 mg total) under the tongue every 5 (five) minutes as needed for chest pain. 30 tablet 3 unknown at unknown  . omeprazole (PRILOSEC) 20 MG capsule TAKE 1 CAPSULE BY MOUTH  DAILY (Patient taking differently: Take 20 mg by mouth daily. ) 90 capsule 3  07/02/2018 at am  . ondansetron (ZOFRAN ODT) 4 MG disintegrating tablet Take 1 tablet (4 mg total) by mouth every 8 (eight) hours as needed for nausea or vomiting. 20 tablet 0 unknown at unknown  . potassium chloride (K-DUR,KLOR-CON) 10 MEQ tablet TAKE 1 TABLET BY MOUTH TWO  TIMES DAILY (Patient taking differently: Take 10 mEq by mouth 2 (two) times daily. ) 180 tablet 3 07/02/2018 at am  . sacubitril-valsartan (ENTRESTO) 24-26 MG Take 1 tablet by mouth 2 (two) times daily.   07/02/2018 at am  . sertraline (ZOLOFT) 50 MG tablet TAKE 1 TABLET BY MOUTH  DAILY (Patient taking differently: Take 50 mg by mouth daily. ) 90 tablet 3 07/02/2018 at am   Scheduled: .  stroke: mapping our early stages of recovery book   Does not apply Once  .  aspirin  300 mg Rectal Daily   Or  . aspirin  325 mg Oral Daily  . atorvastatin  80 mg Oral q1800  . diphenhydrAMINE  50 mg Intravenous Once  . enoxaparin (LOVENOX) injection  40 mg Subcutaneous Q24H  . hydrocortisone sod succinate (SOLU-CORTEF) inj  200 mg Intravenous Once  . sacubitril-valsartan  1 tablet Oral BID    ROS: History obtained from the patient   General ROS: negative for - chills, fatigue, fever, night sweats, weight gain or weight loss Psychological ROS: negative for - behavioral disorder, hallucinations, memory difficulties, mood swings or suicidal ideation Ophthalmic ROS: negative for - blurry vision, double vision, eye pain or loss of vision ENT ROS: negative for - epistaxis, nasal discharge, oral lesions, sore throat, tinnitus or vertigo Allergy and Immunology ROS: negative for - hives or itchy/watery eyes Hematological and Lymphatic ROS: negative for - bleeding problems, bruising or swollen lymph nodes Endocrine ROS: negative for - galactorrhea, hair pattern changes, polydipsia/polyuria or temperature intolerance Respiratory ROS: negative for - cough, hemoptysis, shortness of breath or wheezing Cardiovascular ROS: negative for - chest pain,  dyspnea on exertion, edema or irregular heartbeat Gastrointestinal ROS: negative for - abdominal pain, diarrhea, hematemesis, nausea/vomiting or stool incontinence Genito-Urinary ROS: negative for - dysuria, hematuria, incontinence or urinary frequency/urgency Musculoskeletal ROS: negative for - joint swelling or muscular weakness Neurological ROS: as noted in HPI Dermatological ROS: negative for rash and skin lesion changes  Physical Exam   Vitals Blood pressure (!) 160/59, pulse (!) 58, temperature 98.4 F (36.9 C), temperature source Oral, resp. rate 18, height 5\' 2"  (1.575 m), weight 114.7 kg, SpO2 97 %.   HEENT-  Normocephalic, no lesions, without obvious abnormality.  Normal external eye and conjunctiva.  Normal TM's bilaterally.  Normal auditory canals and external ears. Normal external nose, mucus membranes and septum.  Normal pharynx. Cardiovascular- S1, S2 normal, pulses palpable throughout   Lungs- chest clear, no wheezing, rales, normal symmetric air entry Abdomen- soft, non-tender; bowel sounds normal; no masses,  no organomegaly Extremities- no edema Lymph-no adenopathy palpable Musculoskeletal-no joint tenderness, deformity or swelling Skin-warm and dry, no hyperpigmentation, vitiligo, or suspicious lesions  Neurological Exam  Mental Status: Alert, oriented, thought content appropriate.  Intermittent slurring of speech.  Able to follow 3 step commands without difficulty. Attention span and concentration seemed appropriate  Cranial Nerves: II: Discs flat bilaterally; Visual fields grossly normal, pupils equal, round, reactive to light and accommodation III,IV, VI: ptosis not present, extra-ocular motions intact bilaterally V,VII: smile symmetric, facial light touch sensation intact VIII: hearing normal bilaterally IX,X: gag reflex present XI: bilateral shoulder shrug XII: midline tongue extension Motor: Right :  Upper extremity   5/5 Without pronator drift      Left:  Upper extremity   5/5 without pronator drift Right:   Lower extremity   5/5                                          Left: Lower extremity   5/5 Dystonic movements of the left face and jaw noted.  Sensory: Pinprick and light touch  intact bilaterally Deep Tendon Reflexes: 2+ and symmetric throughout Plantars: Right: upgoing                              Left: mute Cerebellar: Finger-to-nose testing intact bilaterally.  Heel to shin testing normal bilaterally Gait: not tested due to safety concerns  Data Reviewed  Laboratory Studies:  Basic Metabolic Panel: Recent Labs  Lab 07/02/18 1139  NA 141  K 3.9  CL 106  CO2 29  GLUCOSE 100*  BUN 21  CREATININE 0.84  CALCIUM 9.0    Liver Function Tests: Recent Labs  Lab 07/02/18 1139  AST 19  ALT 14  ALKPHOS 103  BILITOT 0.5  PROT 6.8  ALBUMIN 3.7   No results for input(s): LIPASE, AMYLASE in the last 168 hours. No results for input(s): AMMONIA in the last 168 hours.  CBC: Recent Labs  Lab 07/02/18 1139  WBC 6.1  NEUTROABS 4.2  HGB 12.5  HCT 36.9  MCV 85.3  PLT 230    Cardiac Enzymes: Recent Labs  Lab 07/02/18 1139  TROPONINI <0.03    BNP: Invalid input(s): POCBNP  CBG: Recent Labs  Lab 07/02/18 1148  GLUCAP 98    Microbiology: No results found for this or any previous visit.  Coagulation Studies: Recent Labs    07/02/18 1139  LABPROT 13.0  INR 0.99    Urinalysis: No results for input(s): COLORURINE, LABSPEC, PHURINE, GLUCOSEU, HGBUR, BILIRUBINUR, KETONESUR, PROTEINUR, UROBILINOGEN, NITRITE, LEUKOCYTESUR in the last 168 hours.  Invalid input(s): APPERANCEUR  Lipid Panel:    Component Value Date/Time   CHOL 176 04/08/2018 0847   TRIG 139 04/08/2018 0847   HDL 47 04/08/2018 0847   CHOLHDL 3.7 04/08/2018 0847   CHOLHDL 3.2 11/08/2016 0610   VLDL 19 11/08/2016 0610   LDLCALC 101 (H) 04/08/2018 0847    HgbA1C:  Lab Results  Component Value Date   HGBA1C 5.6 04/08/2018    Urine Drug  Screen:      Component Value Date/Time   LABOPIA NONE DETECTED 11/07/2016 2348   COCAINSCRNUR NONE DETECTED 11/07/2016 2348   LABBENZ NONE DETECTED 11/07/2016 2348   AMPHETMU NONE DETECTED 11/07/2016 2348   THCU NONE DETECTED 11/07/2016 2348   LABBARB NONE DETECTED 11/07/2016 2348    Alcohol Level: No results for input(s): ETH in the last 168 hours.  Other results: EKG: Atrial-sensed ventricular-paced rhythm Vent. rate 55 BPM PR interval 128 ms QRS duration 126 ms QT/QTc 484/463 ms P-R-T axes 45 265 84  Imaging: Ct Head Wo Contrast  Result Date: 07/02/2018 CLINICAL DATA:  Sudden onset of left-sided facial droop and slurred speech which has resolved EXAM: CT HEAD WITHOUT CONTRAST TECHNIQUE: Contiguous axial images were obtained from the base of the skull through the vertex without intravenous contrast. COMPARISON:  CT brain scan 06/27/2018 FINDINGS: Brain: The ventricular system is prominent but stable. Mild cortical atrophy also is present. The septum is midline in position. The fourth ventricle and basilar cisterns are unremarkable. No hemorrhage, mass lesion, or acute infarction is noted. Mild small vessel ischemic change is difficult to exclude. Vascular: No vascular and on this unenhanced study. Skull: On bone window images, no calvarial fracture is seen. Sinuses/Orbits: The paranasal sinuses are well pneumatized. Other: None. IMPRESSION: Mild atrophy and possibly mild small vessel ischemic change. No acute intracranial abnormality. Electronically Signed   By: Ivar Drape M.D.   On: 07/02/2018 11:59   US Carotid Duplex Bilateral  Result Date: 07/01/2018 CLINICAL DATA:  Syncope and collapse, hypertension and visual disturbance EXAM: BILATERAL CAROTID DUPLEX ULTRASOUND TECHNIQUE: Pearline Cables scale imaging, color Doppler and duplex ultrasound were performed of bilateral carotid and vertebral arteries in the neck. COMPARISON:  06/27/2018 head CT FINDINGS: Criteria: Quantification of carotid  stenosis is based on velocity parameters that correlate the residual internal carotid diameter with NASCET-based stenosis levels, using the diameter of the distal internal carotid lumen as the denominator for stenosis measurement. The following velocity measurements were obtained: RIGHT ICA: 69/15 cm/sec CCA: 09/3 cm/sec SYSTOLIC ICA/CCA RATIO:  1.6 ECA: 124 cm/sec LEFT ICA: 206/38 cm/sec CCA: 818/29 cm/sec SYSTOLIC ICA/CCA RATIO:  1.2 ECA: 105 cm/sec RIGHT CAROTID ARTERY: Heterogeneous moderate and partially calcified right carotid bifurcation atherosclerosis. Despite this, there is no hemodynamically significant right ICA stenosis, velocity elevation, or turbulent flow. Degree of narrowing estimated less than 50% by ultrasound criteria. RIGHT VERTEBRAL ARTERY:  Antegrade LEFT CAROTID ARTERY: More pronounced irregular calcified right carotid bifurcation atherosclerosis. Left ICA lumen is narrowed proximally. In this region, there is mild left proximal ICA velocity elevation measures 206/38 centimeters/second without significant turbulent flow. By ultrasound criteria, left ICA stenosis estimated at 50-69% (closer to the 50% range). LEFT VERTEBRAL ARTERY:  Antegrade IMPRESSION: Moderate bilateral carotid atherosclerosis. Right ICA narrowing less than 50% Moderate left ICA stenosis estimated at 50-69% (closer to the 50% range). Electronically Signed   By: Jerilynn Mages.  Shick M.D.   On: 07/01/2018 14:34    Patient seen and examined.  Clinical course and management discussed.  Necessary edits performed.  I agree with the above.  Assessment and plan of care developed and discussed below.     Assessment: 72 y.o. female pertinent history of hyperlipidemia, hypertension, diabetes, congestive heart failure status post AICD presenting with slurred speech and left facial droop.  Patient has had intermittent left facial and left upper extremity movements.  Has also had intermittent slurred speech.  On day of admission had an  episode that was prolonged.  Improved today but continues to have facial dystonic movements.  CT head personally reviewed and shows no acute changes.  Unable to obtain MRI of the brain due to pacemaker placement.  US carotids bilaterally did not show significant hemodynamically stenosis, however CTA head and neck shows right carotid bifurcation with 80% stenosis as well as severe near occlusive stenosis at the origin of the external carotid arteries bilaterally otherwise no large vessel occlusion.  AICD is routinely interrogated at [facility], last interrogation on 05/01/2018 showed a run of A. fib.  She was on aspirin 325 mg prior to this event.  LDL 101. Suspect patient with an acute ischemic event about two weeks ago leading to intermittent dystonic movements on the left.  Concern is for embolic etiology despite the fact that CT is unremarkable.  Due to afib patient is a candidate for anticoagulation but will repeat imaging first.      Stroke Risk Factors - atrial fibrillation, carotid stenosis, hyperlipidemia and hypertension  Plan: 1. HgbA1c pending 2. Continue ASA.  Will change to Eliquis if repeat head CT unremarkable.   3. PT consult, OT consult, Speech consult 4. Echocardiogram pending 5. EEG 6. Aggressive lipid management with target LDL<70. 7. NPO until RN stroke swallow screen 8. Telemetry monitoring 9. Frequent neuro checks 10. Repeat CT head without contrast in AM. 11. Spoke with vascular at [facility].  They were made aware of CTA results.  Will see patient in clinic in 1 week.  Patient already given appointment.  This patient was staffed with Dr. Magda Paganini, Doy Mince who personally evaluated patient, reviewed documentation and agreed with assessment and plan of care as above.  Rufina Falco, DNP, FNP-BC Board certified Nurse Practitioner Neurology Department  07/02/2018, 3:32 PM  Alexis Goodell, MD Neurology [phone]  07/03/2018  2:41 PM

## 2018-07-02 NOTE — ED Triage Notes (Signed)
Pt was at [place] with daughter who noticed pt has sudden onset of left sided facial droop and slurred speech for an estimated of 10 minutes. In triage pt a & o x 4, face symmetrical, grips equal and strong.

## 2018-07-03 ENCOUNTER — Observation Stay: Payer: Medicare Other

## 2018-07-03 ENCOUNTER — Observation Stay
Admit: 2018-07-03 | Discharge: 2018-07-03 | Disposition: A | Discharge status: Home / Self Care | Payer: Medicare Other | Attending: Internal Medicine | Admitting: Internal Medicine

## 2018-07-03 ENCOUNTER — Encounter: Admit: 2018-07-03 | Discharge: 2018-07-04 | Payer: MEDICARE

## 2018-07-03 DIAGNOSIS — E785 Hyperlipidemia, unspecified: Secondary | ICD-10-CM | POA: Diagnosis not present

## 2018-07-03 DIAGNOSIS — G459 Transient cerebral ischemic attack, unspecified: Secondary | ICD-10-CM | POA: Diagnosis not present

## 2018-07-03 DIAGNOSIS — I5022 Chronic systolic (congestive) heart failure: Secondary | ICD-10-CM | POA: Diagnosis not present

## 2018-07-03 LAB — LIPID PANEL
CHOL/HDL RATIO: 3.4 ratio
CHOLESTEROL: 150 mg/dL (ref 0–200)
HDL: 44 mg/dL (ref >40)
LDL Cholesterol: 94 mg/dL (ref 0–99)
Triglycerides: 60 mg/dL (ref <150)
VLDL: 12 mg/dL (ref 0–40)

## 2018-07-03 LAB — ECHOCARDIOGRAM COMPLETE
Height: 62 in
WEIGHTICAEL: 4044.8 [oz_av]

## 2018-07-03 MED ORDER — ATORVASTATIN CALCIUM 80 MG PO TABS
80.0000 mg | ORAL_TABLET | Freq: Every day | ORAL | Status: DC
  Administered 2018-07-03: 18:00:00 80 mg via ORAL
  Filled 2018-07-03: qty 4
  Filled 2018-07-03 (×2): qty 1

## 2018-07-03 MED ORDER — SACUBITRIL-VALSARTAN 24-26 MG PO TABS
1.0000 | ORAL_TABLET | Freq: Two times a day (BID) | ORAL | Status: DC
  Administered 2018-07-03 – 2018-07-04 (×3): 1 via ORAL
  Filled 2018-07-03 (×4): qty 1

## 2018-07-03 MED ORDER — ACETAMINOPHEN 325 MG PO TABS: 325.0000 mg | ORAL_TABLET | Freq: Four times a day (QID) | ORAL | Status: DC | PRN

## 2018-07-03 MED ORDER — ENOXAPARIN SODIUM 40 MG/0.4ML ~~LOC~~ SOLN
40.0000 mg | Freq: Two times a day (BID) | SUBCUTANEOUS | Status: DC
  Administered 2018-07-03 – 2018-07-04 (×3): 40 mg via SUBCUTANEOUS
  Filled 2018-07-03 (×3): qty 0.4

## 2018-07-03 NOTE — Care Management Obs Status (Signed)
[place] NOTIFICATION   Patient Details  Name: Anna Marsh MRN: [id number] Date of Birth: 28-Mar-1946   Medicare Observation Status Notification Given:  Yes    Shelbie Hutching, RN 07/03/2018, 10:46 AM

## 2018-07-03 NOTE — Procedures (Signed)
ELECTROENCEPHALOGRAM REPORT   Patient: Anna Marsh       Room #: 112A-AA EEG No. ID: [id number] Age: 72 y.o.        Sex: female Referring Physician: Anselm Jungling Report Date:  07/03/2018        Interpreting Physician: Alexis Goodell  History: [name] is an 72 y.o. female with syncope evaluated to rule out seizure  Medications:  ASA, Lipitor, Benadryl, Entresto, Solucortef  Conditions of Recording:  This is a 21 channel routine scalp EEG performed with bipolar and monopolar montages arranged in accordance to the international 10/20 system of electrode placement. One channel was dedicated to EKG recording.  The patient is in the awake and drowsy states.  Description:  The waking background activity consists of a low voltage, symmetrical, fairly well organized, 10 Hz alpha activity, seen from the parieto-occipital and posterior temporal regions.  Low voltage fast activity, poorly organized, is seen anteriorly and is at times superimposed on more posterior regions.  A mixture of theta and alpha rhythms are seen from the central and temporal regions. The patient drowses with slowing to irregular, low voltage theta and beta activity.   Stage II sleep is not obtained. No epileptiform activity is noted.   Hyperventilation was not performed.  Intermittent photic stimulation was performed but failed to illicit any change in the tracing.   IMPRESSION: Normal electroencephalogram, awake, drowsy and with activation procedures. There are no focal lateralizing or epileptiform features.   Alexis Goodell, MD Neurology [phone] 07/03/2018, 4:10 PM

## 2018-07-03 NOTE — Progress Notes (Signed)
Anticoagulation monitoring(Lovenox):  72yo  female ordered Lovenox 40 mg Q24h for DVT prevention  Filed Weights   07/02/18 1121 07/02/18 1934  Weight: 250 lb (113.4 kg) 252 lb 12.8 oz (114.7 kg)   BMI 46   Lab Results  Component Value Date   CREATININE 0.84 07/02/2018   CREATININE 0.97 06/25/2018   CREATININE 0.99 04/08/2018   Estimated Creatinine Clearance: 72.5 mL/min (by C-G formula based on SCr of 0.84 mg/dL). Hemoglobin & Hematocrit     Component Value Date/Time   HGB 12.5 07/02/2018 1139   HGB 12.6 06/25/2018 0916   HCT 36.9 07/02/2018 1139   HCT 39.0 06/25/2018 0916     Per Protocol for Patient with estCrcl > 30 ml/min and BMI > 40, will transition to Lovenox 40 mg Q12h.     Paulina Fusi, PharmD, BCPS 07/03/2018 10:26 AM

## 2018-07-03 NOTE — Care Management Note (Signed)
Case Management Note  Patient Details  Name: Anna Marsh MRN: [id number] Date of Birth: 03-28-1946  Subjective/Objective:                 Patient under observation for TIA.  Daughter [name] is at the bedside.  Patient and daughter report that they will be moving in together, the patient realized that she should not be living alone anymore.  Patient has sold her house and her daughter is selling her house and they have bought a home that they will share.  Expected discharge today- patient will return to daughters home until they are ready to move into the the new home.  PCP verified as Dr. Miguel Aschoff last visit was Sept 23rd- last Monday.  Patient will follow up after discharge with neurology.  No barriers to obtaining prescriptions, local pharmacy is [facility] but patient receives all of her routine medications from [facility] with [employer].  Daughter will be available for all transportation needs.  Before TIA symptoms began patient was driving herself but since symptoms of TIA have developed she has not driven.   Patient does not require any assistive devices but she does have a cane and walker that were her husband's.  Her husband passed away this past 03/08/2023.  PT will evaluate patient before discharge.     Action/Plan:   Expected Discharge Date:                  Expected Discharge Plan:  Home/Self Care  In-House Referral:     Discharge planning Services  CM Consult  Post Acute Care Choice:    Choice offered to:     DME Arranged:    DME Agency:     HH Arranged:    HH Agency:     Status of Service:  In process, will continue to follow  If discussed at Long Length of Stay Meetings, dates discussed:    Additional Comments:  Shelbie Hutching, RN 07/03/2018, 10:47 AM

## 2018-07-03 NOTE — Progress Notes (Signed)
eeg comp.

## 2018-07-03 NOTE — Progress Notes (Signed)
OT Cancellation Note  Patient Details Name: Anna Marsh MRN: [id number] DOB: 10/08/1945   Cancelled Treatment:    Reason Eval/Treat Not Completed: Patient at procedure or test/ unavailable. Order received, chart reviewed. Upon initial attempt, PT preparing to work with pt. On 2nd attempt, transport staff taking pt out of room for testing. Will re-attempt at later date/time as pt is available and medically appropriate.   Jeni Salles, MPH, MS, OTR/L ascom [phone] 07/03/18, 11:17 AM

## 2018-07-03 NOTE — Evaluation (Signed)
Physical Therapy Evaluation Patient Details Name: Anna Marsh MRN: [id number] DOB: November 16, 1945 Today's Date: 07/03/2018   History of Present Illness  presented to ER secondary to difficulty speaking; admitted for TIA/CVA work up.  Initial imaging negative for acute infarct (MRI unable due to PPM); noted for significant carotid stenosis (planned for outpatient follow up)  Clinical Impression  Upon evaluation, patient alert and oriented; follows commands and demonstrates good effort with all mobility tasks.  Speech clear and fluent; no dysarthria or aphasia noted.  bilat UE/LE strength and ROM grossly symmetrical and WFL; no focal weakness, sensory or coordination deficits appreciated.  Able to complete bed mobility with mod indep; sit/stand, basic transfer and gait (100' x2) with and without RW, close sup/mod indep. Mild higher level balance deficts, weakness to bilat post/lateral hips noted; may benefit from outpatient PT to address. Would benefit from skilled PT to address above deficits and promote optimal return to PLOF; .recommend transition to home with outpatient PT follow up as appropriate.    Follow Up Recommendations Outpatient PT    Equipment Recommendations       Recommendations for Other Services       Precautions / Restrictions Precautions Precautions: Fall Restrictions Weight Bearing Restrictions: No      Mobility  Bed Mobility Overal bed mobility: Modified Independent                Transfers Overall transfer level: Modified independent Equipment used: None             General transfer comment: performed with and without assist device, mod indep  Ambulation/Gait Ambulation/Gait assistance: Supervision Gait Distance (Feet): 100 Feet       Gait velocity interpretation: <1.31 ft/sec, indicative of household ambulator General Gait Details: reciprocal stepping pattern, mild sway/antalgic pattern bilat LEs (baseline post/lateral hip weakness); no  overt buckling or LOB  Stairs            Wheelchair Mobility    Modified Rankin (Stroke Patients Only)       Balance Overall balance assessment: Needs assistance Sitting-balance support: No upper extremity supported;Feet supported Sitting balance-Leahy Scale: Good     Standing balance support: No upper extremity supported Standing balance-Leahy Scale: Fair                               Pertinent Vitals/Pain Pain Assessment: No/denies pain    Home Living Family/patient expects to be discharged to:: Private residence Living Arrangements: Children Available Help at Discharge: Family Type of Home: House       Home Layout: Multi-level Home Equipment: None      Prior Function Level of Independence: Independent         Comments: Indep with ADLs, household and community mobilization; driving; denies fall history.     Hand Dominance        Extremity/Trunk Assessment   Upper Extremity Assessment Upper Extremity Assessment: Overall WFL for tasks assessed(grossly 4+/5, symmetrical; no focal weakness, sensory and coordination deficits)    Lower Extremity Assessment Lower Extremity Assessment: Overall WFL for tasks assessed(grossly 4+/5, symmetrical; no focal weakness, sensory and coordination deficits)       Communication   Communication: No difficulties  Cognition Arousal/Alertness: Awake/alert Behavior During Therapy: WFL for tasks assessed/performed Overall Cognitive Status: Within Functional Limits for tasks assessed  General Comments      Exercises Other Exercises Other Exercises: Gait x100' with RW, sup-improved cadence, gait speed, gait symmetry and overall control; encouraged continued use of RW for longer-distance gait and activities when patient home alone.  Patient/daughter voiced understanding. Other Exercises: Issued handout with seated and standing LE therex for use as  HEP at discharge.   Assessment/Plan    PT Assessment Patient needs continued PT services  PT Problem List Decreased balance;Decreased mobility       PT Treatment Interventions DME instruction;Gait training;Stair training;Functional mobility training;Therapeutic activities;Therapeutic exercise;Balance training;Patient/family education;Neuromuscular re-education    PT Goals (Current goals can be found in the Care Plan section)  Acute Rehab PT Goals Patient Stated Goal: to return home PT Goal Formulation: With patient/family Time For Goal Achievement: 07/17/18 Potential to Achieve Goals: Good    Frequency Min 2X/week   Barriers to discharge        Co-evaluation               AM-PAC PT "6 Clicks" Daily Activity  Outcome Measure Difficulty turning over in bed (including adjusting bedclothes, sheets and blankets)?: None   Difficulty sitting down on and standing up from a chair with arms (e.g., wheelchair, bedside commode, etc,.)?: None Help needed moving to and from a bed to chair (including a wheelchair)?: A Little Help needed walking in hospital room?: A Little Help needed climbing 3-5 steps with a railing? : A Little 6 Click Score: 17    End of Session Equipment Utilized During Treatment: Gait belt Activity Tolerance: Patient tolerated treatment well Patient left: in bed;with call bell/phone within reach;with bed alarm set;with family/visitor present Nurse Communication: Mobility status PT Visit Diagnosis: Muscle weakness (generalized) (M62.81);Difficulty in walking, not elsewhere classified (R26.2)    Time: 5170-0174 PT Time Calculation (min) (ACUTE ONLY): 26 min   Charges:   PT Evaluation $PT Eval Low Complexity: 1 Low PT Treatments $Gait Training: 8-22 mins      Ariez Neilan H. Owens Shark, PT, DPT, NCS 07/03/18, 1:43 PM [phone]

## 2018-07-03 NOTE — Progress Notes (Signed)
*  PRELIMINARY RESULTS* Echocardiogram 2D Echocardiogram has been performed.  Anna Marsh Abeni Finchum 07/03/2018, 10:38 AM

## 2018-07-03 NOTE — Progress Notes (Signed)
OT Screen Note  Patient Details Name: Anna Marsh MRN: [id number] DOB: 11/07/1945   Cancelled Treatment:    Reason Eval/Treat Not Completed: OT screened, no needs identified, will sign off. Order received, chart reviewed. Pt back to baseline functional independence.  Left pt with handouts from PT for BLE exercises to do at home. Briefly reviewed.  No skilled OT needs identified. Will sign off. Please re-consult if additional needs arise.  Jeni Salles, MPH, MS, OTR/L ascom [phone] 07/03/18, 2:08 PM

## 2018-07-03 NOTE — Progress Notes (Signed)
Patient admitted for stroke-like symptoms.  Short run of atrial fibrillation noted on cardiac monitoring. Head CT negative for hemorrhagic stroke.  Recommend Eliquis 5mg  twice daily upon discharge.

## 2018-07-03 NOTE — Progress Notes (Signed)
[facility] [place] NAME: Anna Marsh    MR#:  [id number]  DATE OF BIRTH:  1946-08-09  SUBJECTIVE:  CHIEF COMPLAINT:   Chief Complaint  Patient presents with  . Aphasia   Came with speech problem, confusion - resolved now.  No focal neurological symptoms. REVIEW OF SYSTEMS:  CONSTITUTIONAL: No fever, fatigue or weakness.  EYES: No blurred or double vision.  EARS, NOSE, AND THROAT: No tinnitus or ear pain.  RESPIRATORY: No cough, shortness of breath, wheezing or hemoptysis.  CARDIOVASCULAR: No chest pain, orthopnea, edema.  GASTROINTESTINAL: No nausea, vomiting, diarrhea or abdominal pain.  GENITOURINARY: No dysuria, hematuria.  ENDOCRINE: No polyuria, nocturia,  HEMATOLOGY: No anemia, easy bruising or bleeding SKIN: No rash or lesion. MUSCULOSKELETAL: No joint pain or arthritis.   NEUROLOGIC: No tingling, numbness, weakness.  PSYCHIATRY: No anxiety or depression.   ROS  DRUG ALLERGIES:   Allergies  Allergen Reactions  . Iodinated Diagnostic Agents Other (See Comments)    Causes migraines  . Iodine     Contrast Dye causes migraines  . Lisinopril Cough  . Penicillins Rash    Has patient had a PCN reaction causing immediate rash, facial/tongue/throat swelling, SOB or lightheadedness with hypotension: Yes Has patient had a PCN reaction causing severe rash involving mucus membranes or skin necrosis: No Has patient had a PCN reaction that required hospitalization: No Has patient had a PCN reaction occurring within the last 10 years: Yes If all of the above answers are "NO", then may proceed with Cephalosporin use.    VITALS:  Blood pressure (!) 160/59, pulse (!) 58, temperature 98.4 F (36.9 C), temperature source Oral, resp. rate 18, height 5\' 2"  (1.575 m), weight 114.7 kg, SpO2 97 %.  PHYSICAL EXAMINATION:  GENERAL:  72 y.o.-year-old patient lying in the bed with no acute distress.  EYES: Pupils equal, round, reactive to light  and accommodation. No scleral icterus. Extraocular muscles intact.  HEENT: Head atraumatic, normocephalic. Oropharynx and nasopharynx clear.  NECK:  Supple, no jugular venous distention. No thyroid enlargement, no tenderness.  LUNGS: Normal breath sounds bilaterally, no wheezing, rales,rhonchi or crepitation. No use of accessory muscles of respiration.  CARDIOVASCULAR: S1, S2 normal. No murmurs, rubs, or gallops.  ABDOMEN: Soft, nontender, nondistended. Bowel sounds present. No organomegaly or mass.  EXTREMITIES: No pedal edema, cyanosis, or clubbing.  NEUROLOGIC: Cranial nerves II through XII are intact. Muscle strength 5/5 in all extremities. Sensation intact. Gait not checked.  PSYCHIATRIC: The patient is alert and oriented x 3.  SKIN: No obvious rash, lesion, or ulcer.   Physical Exam LABORATORY PANEL:   CBC Recent Labs  Lab 07/02/18 1139  WBC 6.1  HGB 12.5  HCT 36.9  PLT 230   ------------------------------------------------------------------------------------------------------------------  Chemistries  Recent Labs  Lab 07/02/18 1139  NA 141  K 3.9  CL 106  CO2 29  GLUCOSE 100*  BUN 21  CREATININE 0.84  CALCIUM 9.0  AST 19  ALT 14  ALKPHOS 103  BILITOT 0.5   ------------------------------------------------------------------------------------------------------------------  Cardiac Enzymes Recent Labs  Lab 07/02/18 1139  TROPONINI <0.03   ------------------------------------------------------------------------------------------------------------------  RADIOLOGY:  Ct Angio Head W Or Wo Contrast  Result Date: 07/02/2018 CLINICAL DATA:  Initial evaluation for slurred speech. EXAM: CT ANGIOGRAPHY HEAD AND NECK TECHNIQUE: Multidetector CT imaging of the head and neck was performed using the standard protocol during bolus administration of intravenous contrast. Multiplanar CT image reconstructions and MIPs were obtained to evaluate the vascular anatomy.  Carotid  stenosis measurements (when applicable) are obtained utilizing NASCET criteria, using the distal internal carotid diameter as the denominator. CONTRAST:  55mL OMNIPAQUE IOHEXOL 350 MG/ML SOLN COMPARISON:  Prior CT from earlier the same day. FINDINGS: CTA NECK FINDINGS Aortic arch: Visualized aortic arch of normal caliber with normal branch pattern. Moderate atheromatous plaque about the visualized arch and origin of the great vessels. Visualized subclavian arteries demonstrate scattered atheromatous irregularity but are patent without flow-limiting stenosis. Right carotid system: Atheromatous plaque at the origin of the right chronic carotid artery with mild approximate 25% narrowing. Right common carotid artery otherwise patent to the bifurcation without stenosis. Extensive atheromatous plaque about the right bifurcation. Resultant severe near occlusive stenosis of the proximal right ICA (series 6, image 143). Exact percentage difficult to discern, although is at least greater than 80%). No made of additional severe stenosis at the origin of the right external carotid artery. Right ICA patent distally to the skull base without stenosis, dissection, or occlusion. Left carotid system: Atheromatous plaque at the origin of the left common carotid artery with associated stenosis of approximately 40% by NASCET criteria. Left CCA demonstrates atheromatous plaque distally but is otherwise patent to the bifurcation without hemodynamically significant stenosis. Prominent mixed plaque about the left bifurcation/proximal left ICA with associated stenosis of up to approximate 50% by NASCET criteria. Note made of severe near occlusive stenosis at the origin of the left external carotid artery. Left ICA tortuous but otherwise patent to the skull base. Vertebral arteries: Left vertebral artery arises separately from the aortic arch. Right vertebral artery arises from the subclavian artery. Focal plaque at the origin of the right  vertebral artery with approximate 40% stenosis. Focal plaque at the origin of the left vertebral artery with approximate 50% stenosis. Vertebral arteries otherwise patent within the neck. Right vertebral artery dominant. Skeleton: No acute osseus abnormality. No discrete lytic or blastic osseous lesions. Moderate cervical spondylolysis noted. Other neck: No other acute soft tissue abnormality within the neck. Upper chest: Visualized upper chest demonstrates no acute finding. Left-sided pacemaker/AICD within normal limits. Visualized upper lungs are clear. Review of the MIP images confirms the above findings CTA HEAD FINDINGS Anterior circulation: Petrous segments widely patent bilaterally. Scattered multifocal atheromatous plaque within the cavernous/supraclinoid ICAs with mild multifocal narrowing. ICA termini widely patent. A1 segments patent bilaterally. Normal anterior communicating artery. Anterior cerebral arteries patent to their distal aspects without high-grade stenosis. M1 segments patent without stenosis. Normal MCA bifurcations. No proximal M2 occlusion. Distal MCA branches well perfused and symmetric. Posterior circulation: Vertebral arteries patent to the vertebrobasilar junction without stenosis. Right vertebral artery dominant. Posterior inferior cerebral arteries patent bilaterally. Basilar widely patent to its distal aspect. Superior cerebral arteries patent bilaterally. Both of the posterior cerebral artery supplied via the basilar and are well perfused to their distal aspects without stenosis. Venous sinuses: Patent. Anatomic variants: None significant.  No aneurysm. Delayed phase: No abnormal enhancement. Review of the MIP images confirms the above findings IMPRESSION: 1. Negative CTA for large vessel occlusion. 2. Heavy atheromatous plaque about the right carotid bifurcation with severe near occlusive (at least 80%) stenosis by NASCET criteria. 3. Approximate 50% atheromatous stenosis at the  left carotid bifurcation. 4. Moderate atherosclerotic change about the aortic arch and origin of the great vessels. Associated 40-50% stenosis at the origin of the vertebral arteries bilaterally. Posterior circulation otherwise widely patent. 5. Incidental severe near occlusive stenoses at the origin of the external carotid arteries bilaterally. Electronically Signed   By:  Jeannine Boga M.D.   On: 07/02/2018 19:26   Ct Head Wo Contrast  Result Date: 07/02/2018 CLINICAL DATA:  Sudden onset of left-sided facial droop and slurred speech which has resolved EXAM: CT HEAD WITHOUT CONTRAST TECHNIQUE: Contiguous axial images were obtained from the base of the skull through the vertex without intravenous contrast. COMPARISON:  CT brain scan 06/27/2018 FINDINGS: Brain: The ventricular system is prominent but stable. Mild cortical atrophy also is present. The septum is midline in position. The fourth ventricle and basilar cisterns are unremarkable. No hemorrhage, mass lesion, or acute infarction is noted. Mild small vessel ischemic change is difficult to exclude. Vascular: No vascular and on this unenhanced study. Skull: On bone window images, no calvarial fracture is seen. Sinuses/Orbits: The paranasal sinuses are well pneumatized. Other: None. IMPRESSION: Mild atrophy and possibly mild small vessel ischemic change. No acute intracranial abnormality. Electronically Signed   By: Ivar Drape M.D.   On: 07/02/2018 11:59   Ct Angio Neck W Or Wo Contrast  Result Date: 07/02/2018 CLINICAL DATA:  Initial evaluation for slurred speech. EXAM: CT ANGIOGRAPHY HEAD AND NECK TECHNIQUE: Multidetector CT imaging of the head and neck was performed using the standard protocol during bolus administration of intravenous contrast. Multiplanar CT image reconstructions and MIPs were obtained to evaluate the vascular anatomy. Carotid stenosis measurements (when applicable) are obtained utilizing NASCET criteria, using the distal  internal carotid diameter as the denominator. CONTRAST:  74mL OMNIPAQUE IOHEXOL 350 MG/ML SOLN COMPARISON:  Prior CT from earlier the same day. FINDINGS: CTA NECK FINDINGS Aortic arch: Visualized aortic arch of normal caliber with normal branch pattern. Moderate atheromatous plaque about the visualized arch and origin of the great vessels. Visualized subclavian arteries demonstrate scattered atheromatous irregularity but are patent without flow-limiting stenosis. Right carotid system: Atheromatous plaque at the origin of the right chronic carotid artery with mild approximate 25% narrowing. Right common carotid artery otherwise patent to the bifurcation without stenosis. Extensive atheromatous plaque about the right bifurcation. Resultant severe near occlusive stenosis of the proximal right ICA (series 6, image 143). Exact percentage difficult to discern, although is at least greater than 80%). No made of additional severe stenosis at the origin of the right external carotid artery. Right ICA patent distally to the skull base without stenosis, dissection, or occlusion. Left carotid system: Atheromatous plaque at the origin of the left common carotid artery with associated stenosis of approximately 40% by NASCET criteria. Left CCA demonstrates atheromatous plaque distally but is otherwise patent to the bifurcation without hemodynamically significant stenosis. Prominent mixed plaque about the left bifurcation/proximal left ICA with associated stenosis of up to approximate 50% by NASCET criteria. Note made of severe near occlusive stenosis at the origin of the left external carotid artery. Left ICA tortuous but otherwise patent to the skull base. Vertebral arteries: Left vertebral artery arises separately from the aortic arch. Right vertebral artery arises from the subclavian artery. Focal plaque at the origin of the right vertebral artery with approximate 40% stenosis. Focal plaque at the origin of the left vertebral  artery with approximate 50% stenosis. Vertebral arteries otherwise patent within the neck. Right vertebral artery dominant. Skeleton: No acute osseus abnormality. No discrete lytic or blastic osseous lesions. Moderate cervical spondylolysis noted. Other neck: No other acute soft tissue abnormality within the neck. Upper chest: Visualized upper chest demonstrates no acute finding. Left-sided pacemaker/AICD within normal limits. Visualized upper lungs are clear. Review of the MIP images confirms the above findings CTA HEAD FINDINGS Anterior  circulation: Petrous segments widely patent bilaterally. Scattered multifocal atheromatous plaque within the cavernous/supraclinoid ICAs with mild multifocal narrowing. ICA termini widely patent. A1 segments patent bilaterally. Normal anterior communicating artery. Anterior cerebral arteries patent to their distal aspects without high-grade stenosis. M1 segments patent without stenosis. Normal MCA bifurcations. No proximal M2 occlusion. Distal MCA branches well perfused and symmetric. Posterior circulation: Vertebral arteries patent to the vertebrobasilar junction without stenosis. Right vertebral artery dominant. Posterior inferior cerebral arteries patent bilaterally. Basilar widely patent to its distal aspect. Superior cerebral arteries patent bilaterally. Both of the posterior cerebral artery supplied via the basilar and are well perfused to their distal aspects without stenosis. Venous sinuses: Patent. Anatomic variants: None significant.  No aneurysm. Delayed phase: No abnormal enhancement. Review of the MIP images confirms the above findings IMPRESSION: 1. Negative CTA for large vessel occlusion. 2. Heavy atheromatous plaque about the right carotid bifurcation with severe near occlusive (at least 80%) stenosis by NASCET criteria. 3. Approximate 50% atheromatous stenosis at the left carotid bifurcation. 4. Moderate atherosclerotic change about the aortic arch and origin of  the great vessels. Associated 40-50% stenosis at the origin of the vertebral arteries bilaterally. Posterior circulation otherwise widely patent. 5. Incidental severe near occlusive stenoses at the origin of the external carotid arteries bilaterally. Electronically Signed   By: Jeannine Boga M.D.   On: 07/02/2018 19:26    ASSESSMENT AND PLAN:   Active Problems:   TIA (transient ischemic attack)  1.  Aphasia likely related to TIA Continue aspirin for now Patient will need full dose anticoagulation timing of this will need to be determined by neurology due to atrial fibrillation noted and her interrogation PT evaluation Speech eval Unable to do MRI-due to pacemaker. Right-sided carotid artery stenosis per CTA of her neck , patient follows at [facility] vascular services. Neurologist as discussed with [facility] vascular, they do not want urgent transfer but advised to follow as outpatient and approved to start on anticoagulation. As we cannot do MRI on the patient, neurologist is suggesting to do repeat CT scan tomorrow  2.  Hyperlipidemia continue Lipitor at 80 mg lipid panel in the morning  3.  Chronic systolic CHF currently compensated continue Lasix Continue Entresto  4.  Depression anxiety continue Zoloft  5.  GERD continue PPIs  6.  Miscellaneous patient will need Lovenox until decide about full dose anticoagulation  7. Paroxysmal A fib- anticoagulant needed at discharge, follow with Dr. Ubaldo Glassing.   All the records are reviewed and case discussed with Care Management/Social Workerr. Management plans discussed with the patient, family and they are in agreement.  CODE STATUS: Full.  TOTAL TIME TAKING CARE OF THIS PATIENT: 35 minutes.     POSSIBLE D/C IN 1-2 DAYS, DEPENDING ON CLINICAL CONDITION.   Anna Marsh M.D on 07/03/2018   Between 7am to 6pm - Pager - [phone]  After 6pm go to [email or web address] - password [email or web address] [facility]  Office   [phone]  CC: Primary care physician; Anna Banana., MD  Note: This dictation was prepared with Dragon dictation along with smaller phrase technology. Any transcriptional errors that result from this process are unintentional.

## 2018-07-04 ENCOUNTER — Observation Stay: Payer: Medicare Other

## 2018-07-04 DIAGNOSIS — I639 Cerebral infarction, unspecified: Secondary | ICD-10-CM | POA: Diagnosis not present

## 2018-07-04 DIAGNOSIS — G459 Transient cerebral ischemic attack, unspecified: Secondary | ICD-10-CM | POA: Diagnosis not present

## 2018-07-04 DIAGNOSIS — I5022 Chronic systolic (congestive) heart failure: Secondary | ICD-10-CM | POA: Diagnosis not present

## 2018-07-04 DIAGNOSIS — E785 Hyperlipidemia, unspecified: Secondary | ICD-10-CM | POA: Diagnosis not present

## 2018-07-04 LAB — HEMOGLOBIN A1C
HEMOGLOBIN A1C: 5.7 % — AB (ref 4.8–5.6)
MEAN PLASMA GLUCOSE: 117 mg/dL

## 2018-07-04 MED ORDER — APIXABAN 5 MG PO TABS: 5.0000 mg | ORAL_TABLET | Freq: Two times a day (BID) | ORAL | 0 refills | Status: DC

## 2018-07-04 MED ORDER — FLUCONAZOLE 100 MG PO TABS
150.0000 mg | ORAL_TABLET | Freq: Once | ORAL | Status: AC
  Administered 2018-07-04: 150 mg via ORAL
  Filled 2018-07-04: qty 1.5

## 2018-07-04 NOTE — Care Management (Signed)
Discharge to home today per Dr, Anselm Jungling. Physical therapy recommended outpatient services. Discussed with Ms [name] and family member at the bedside. Declining this service at this time. States her insurance wouldn't pay for this services. States she had outpatient speech here at this facility and [facility] would not pay. Family will transport Anna Ammons RN MSN CCM Care Management [phone]

## 2018-07-04 NOTE — Discharge Summary (Signed)
[facility] NAME: Anna Marsh    MR#:  [id number]  DATE OF BIRTH:  1945-12-21  DATE OF ADMISSION:  07/02/2018 ADMITTING PHYSICIAN: Dustin Flock, MD  DATE OF DISCHARGE: 07/04/2018   PRIMARY CARE PHYSICIAN: Jerrol Banana., MD    ADMISSION DIAGNOSIS:  TIA (transient ischemic attack) [G45.9] Paroxysmal A-fib ([place]) [I48.0]  DISCHARGE DIAGNOSIS:  Active Problems:   TIA (transient ischemic attack)   SECONDARY DIAGNOSIS:   Past Medical History:  Diagnosis Date  . AICD (automatic cardioverter/defibrillator) present   . Anxiety   . AV block, complete (HCC)   . Cervical radiculopathy   . CHF (congestive heart failure) ([place])   . Chronic systolic heart failure ([place])   . GERD (gastroesophageal reflux disease)   . History of cardiac arrest   . Hypercholesteremia   . Hypertension   . Pre-diabetes   . 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 Right arm weakness     HOSPITAL COURSE:   1.Aphasia likely related to TIA Continue aspirin for now Patient will need full dose anticoagulation timing of this will need to be determined by neurology due to atrial fibrillation noted and her interrogation PT evaluation Speech eval Unable to do MRI-due to pacemaker. Right-sided carotid artery stenosis per CTA of her neck , patient follows at [facility] vascular services. Neurologist as discussed with [facility] vascular, they do not want urgent transfer but advised to follow as outpatient and approved to start on anticoagulation. Pt have appointment in next 2 weeks. As we cannot do MRI on the patient, neurologist is suggesting to do repeat CT scan next day, which was still negative- suggest to start Eliquis for A fib  And stop ASA  2.Hyperlipidemia continue Lipitor at 80 mg lipid panel in the morning  3.Chronic systolic CHF currently compensated continue Lasix Continue Entresto  4.Depression  anxiety continue Zoloft  5.GERD continue PPIs  6.Miscellaneous patient will need Lovenox until decide about full dose anticoagulation  7. Paroxysmal A fib- anticoagulant needed at discharge, follow with Dr. Ubaldo Glassing.   DISCHARGE CONDITIONS:   Stable.  CONSULTS OBTAINED:  Treatment Team:  Alexis Goodell, MD  DRUG ALLERGIES:   Allergies  Allergen Reactions  . Iodinated Diagnostic Agents Other (See Comments)    Causes migraines  . Iodine     Contrast Dye causes migraines  . Lisinopril Cough  . Penicillins Rash    Has patient had a PCN reaction causing immediate rash, facial/tongue/throat swelling, SOB or lightheadedness with hypotension: Yes Has patient had a PCN reaction causing severe rash involving mucus membranes or skin necrosis: No Has patient had a PCN reaction that required hospitalization: No Has patient had a PCN reaction occurring within the last 10 years: Yes If all of the above answers are "NO", then may proceed with Cephalosporin use.    DISCHARGE MEDICATIONS:   Allergies as of 07/04/2018      Reactions   Iodinated Diagnostic Agents Other (See Comments)   Causes migraines   Iodine    Contrast Dye causes migraines   Lisinopril Cough   Penicillins Rash   Has patient had a PCN reaction causing immediate rash, facial/tongue/throat swelling, SOB or lightheadedness with hypotension: Yes Has patient had a PCN reaction causing severe rash involving mucus membranes or skin necrosis: No Has patient had a PCN reaction that required hospitalization: No Has patient had a PCN reaction occurring within the last 10 years: Yes  If all of the above answers are "NO", then may proceed with Cephalosporin use.      Medication List    STOP taking these medications   aspirin EC 325 MG tablet   furosemide 40 MG tablet Commonly known as:  LASIX   metoprolol tartrate 25 MG tablet Commonly known as:  LOPRESSOR     TAKE these medications   apixaban 5 MG Tabs  tablet Commonly known as:  ELIQUIS Take 1 tablet (5 mg total) by mouth 2 (two) times daily.   atorvastatin 80 MG tablet Commonly known as:  LIPITOR TAKE 1 TABLET BY MOUTH  DAILY   butalbital-acetaminophen-caffeine 50-325-40 MG tablet Commonly known as:  FIORICET, ESGIC Take 1 tablet by mouth every 4 (four) hours as needed for headache.   cyanocobalamin 1000 MCG tablet Take 1,000 mcg by mouth daily.   gabapentin 100 MG capsule Commonly known as:  NEURONTIN Take 100 mg by mouth at bedtime.   loratadine 10 MG tablet Commonly known as:  CLARITIN Take 10 mg by mouth daily.   multivitamin with minerals tablet Take 1 tablet by mouth daily.   nitroGLYCERIN 0.4 MG SL tablet Commonly known as:  NITROSTAT Place 1 tablet (0.4 mg total) under the tongue every 5 (five) minutes as needed for chest pain.   omeprazole 20 MG capsule Commonly known as:  PRILOSEC TAKE 1 CAPSULE BY MOUTH  DAILY   ondansetron 4 MG disintegrating tablet Commonly known as:  ZOFRAN-ODT Take 1 tablet (4 mg total) by mouth every 8 (eight) hours as needed for nausea or vomiting.   potassium chloride 10 MEQ tablet Commonly known as:  K-DUR,KLOR-CON TAKE 1 TABLET BY MOUTH TWO  TIMES DAILY   sacubitril-valsartan 24-26 MG Commonly known as:  ENTRESTO Take 1 tablet by mouth 2 (two) times daily.   sertraline 50 MG tablet Commonly known as:  ZOLOFT TAKE 1 TABLET BY MOUTH  DAILY        DISCHARGE INSTRUCTIONS:    Follow with [facility].  If you experience worsening of your admission symptoms, develop shortness of breath, life threatening emergency, suicidal or homicidal thoughts you must seek medical attention immediately by calling 911 or calling your MD immediately  if symptoms less severe.  You Must read complete instructions/literature along with all the possible adverse reactions/side effects for all the Medicines you take and that have been prescribed to you. Take any new Medicines after you have  completely understood and accept all the possible adverse reactions/side effects.   Please note  You were cared for by a hospitalist during your hospital stay. If you have any questions about your discharge medications or the care you received while you were in the hospital after you are discharged, you can call the unit and asked to speak with the hospitalist on call if the hospitalist that took care of you is not available. Once you are discharged, your primary care physician will handle any further medical issues. Please note that NO REFILLS for any discharge medications will be authorized once you are discharged, as it is imperative that you return to your primary care physician (or establish a relationship with a primary care physician if you do not have one) for your aftercare needs so that they can reassess your need for medications and monitor your lab values.    Today   CHIEF COMPLAINT:   Chief Complaint  Patient presents with  . Aphasia    HISTORY OF PRESENT ILLNESS:  Anna Marsh  is a  72 y.o. female with a known history of AICD, pacemaker, anxiety, congestive heart failure, GERD, hypercholesteremia, essential hypertension who is presenting with complaint of a aphasia.  According to patient and her daughter she has not been feeling well recently.  She passed out last week.  And was noted to have a UTI.  Patient in the ER CT scan of the head was negative.  She currently continues to have some expressive aphasia. Patient also had a carotid Doppler yesterday which shows left-sided carotid artery stenosis.  In the ER her pacemaker defibrillator was interrogated and is showing that patient is having episodes of atrial fibrillation.   VITAL SIGNS:  Blood pressure (!) 149/75, pulse 62, temperature 97.7 F (36.5 C), temperature source Oral, resp. rate 18, height 5\' 2"  (1.575 m), weight 114.7 kg, SpO2 99 %.  I/O:    Intake/Output Summary (Last 24 hours) at 07/04/2018 1431 Last data  filed at 07/04/2018 1300 Gross per 24 hour  Intake 600 ml  Output -  Net 600 ml    PHYSICAL EXAMINATION:  GENERAL:  72 y.o.-year-old patient lying in the bed with no acute distress.  EYES: Pupils equal, round, reactive to light and accommodation. No scleral icterus. Extraocular muscles intact.  HEENT: Head atraumatic, normocephalic. Oropharynx and nasopharynx clear.  NECK:  Supple, no jugular venous distention. No thyroid enlargement, no tenderness.  LUNGS: Normal breath sounds bilaterally, no wheezing, rales,rhonchi or crepitation. No use of accessory muscles of respiration.  CARDIOVASCULAR: S1, S2 normal. No murmurs, rubs, or gallops.  ABDOMEN: Soft, non-tender, non-distended. Bowel sounds present. No organomegaly or mass.  EXTREMITIES: No pedal edema, cyanosis, or clubbing.  NEUROLOGIC: Cranial nerves II through XII are intact. Muscle strength 5/5 in all extremities. Sensation intact. Gait not checked.  PSYCHIATRIC: The patient is alert and oriented x 3.  SKIN: No obvious rash, lesion, or ulcer.   DATA REVIEW:   CBC Recent Labs  Lab 07/02/18 1139  WBC 6.1  HGB 12.5  HCT 36.9  PLT 230    Chemistries  Recent Labs  Lab 07/02/18 1139  NA 141  K 3.9  CL 106  CO2 29  GLUCOSE 100*  BUN 21  CREATININE 0.84  CALCIUM 9.0  AST 19  ALT 14  ALKPHOS 103  BILITOT 0.5    Cardiac Enzymes Recent Labs  Lab 07/02/18 1139  [place] <0.03    Microbiology Results  No results found for this or any previous visit.  RADIOLOGY:  Ct Angio Head W Or Wo Contrast  Result Date: 07/02/2018 CLINICAL DATA:  Initial evaluation for slurred speech. EXAM: CT ANGIOGRAPHY HEAD AND NECK TECHNIQUE: Multidetector CT imaging of the head and neck was performed using the standard protocol during bolus administration of intravenous contrast. Multiplanar CT image reconstructions and MIPs were obtained to evaluate the vascular anatomy. Carotid stenosis measurements (when applicable) are obtained  utilizing NASCET criteria, using the distal internal carotid diameter as the denominator. CONTRAST:  34mL OMNIPAQUE IOHEXOL 350 MG/ML SOLN COMPARISON:  Prior CT from earlier the same day. FINDINGS: CTA NECK FINDINGS Aortic arch: Visualized aortic arch of normal caliber with normal branch pattern. Moderate atheromatous plaque about the visualized arch and origin of the great vessels. Visualized subclavian arteries demonstrate scattered atheromatous irregularity but are patent without flow-limiting stenosis. Right carotid system: Atheromatous plaque at the origin of the right chronic carotid artery with mild approximate 25% narrowing. Right common carotid artery otherwise patent to the bifurcation without stenosis. Extensive atheromatous plaque about the right bifurcation. Resultant severe  near occlusive stenosis of the proximal right ICA (series 6, image 143). Exact percentage difficult to discern, although is at least greater than 80%). No made of additional severe stenosis at the origin of the right external carotid artery. Right ICA patent distally to the skull base without stenosis, dissection, or occlusion. Left carotid system: Atheromatous plaque at the origin of the left common carotid artery with associated stenosis of approximately 40% by NASCET criteria. Left CCA demonstrates atheromatous plaque distally but is otherwise patent to the bifurcation without hemodynamically significant stenosis. Prominent mixed plaque about the left bifurcation/proximal left ICA with associated stenosis of up to approximate 50% by NASCET criteria. Note made of severe near occlusive stenosis at the origin of the left external carotid artery. Left ICA tortuous but otherwise patent to the skull base. Vertebral arteries: Left vertebral artery arises separately from the aortic arch. Right vertebral artery arises from the subclavian artery. Focal plaque at the origin of the right vertebral artery with approximate 40% stenosis. Focal  plaque at the origin of the left vertebral artery with approximate 50% stenosis. Vertebral arteries otherwise patent within the neck. Right vertebral artery dominant. Skeleton: No acute osseus abnormality. No discrete lytic or blastic osseous lesions. Moderate cervical spondylolysis noted. Other neck: No other acute soft tissue abnormality within the neck. Upper chest: Visualized upper chest demonstrates no acute finding. Left-sided pacemaker/AICD within normal limits. Visualized upper lungs are clear. Review of the MIP images confirms the above findings CTA HEAD FINDINGS Anterior circulation: Petrous segments widely patent bilaterally. Scattered multifocal atheromatous plaque within the cavernous/supraclinoid ICAs with mild multifocal narrowing. ICA termini widely patent. A1 segments patent bilaterally. Normal anterior communicating artery. Anterior cerebral arteries patent to their distal aspects without high-grade stenosis. M1 segments patent without stenosis. Normal MCA bifurcations. No proximal M2 occlusion. Distal MCA branches well perfused and symmetric. Posterior circulation: Vertebral arteries patent to the vertebrobasilar junction without stenosis. Right vertebral artery dominant. Posterior inferior cerebral arteries patent bilaterally. Basilar widely patent to its distal aspect. Superior cerebral arteries patent bilaterally. Both of the posterior cerebral artery supplied via the basilar and are well perfused to their distal aspects without stenosis. Venous sinuses: Patent. Anatomic variants: None significant.  No aneurysm. Delayed phase: No abnormal enhancement. Review of the MIP images confirms the above findings IMPRESSION: 1. Negative CTA for large vessel occlusion. 2. Heavy atheromatous plaque about the right carotid bifurcation with severe near occlusive (at least 80%) stenosis by NASCET criteria. 3. Approximate 50% atheromatous stenosis at the left carotid bifurcation. 4. Moderate atherosclerotic  change about the aortic arch and origin of the great vessels. Associated 40-50% stenosis at the origin of the vertebral arteries bilaterally. Posterior circulation otherwise widely patent. 5. Incidental severe near occlusive stenoses at the origin of the external carotid arteries bilaterally. Electronically Signed   By: Jeannine Boga M.D.   On: 07/02/2018 19:26   Ct Head Wo Contrast  Result Date: 07/04/2018 CLINICAL DATA:  Stroke, follow-up, slurred speech. Left facial droop EXAM: CT HEAD WITHOUT CONTRAST TECHNIQUE: Contiguous axial images were obtained from the base of the skull through the vertex without intravenous contrast. COMPARISON:  07/02/2018 FINDINGS: Brain: No acute intracranial abnormality. Specifically, no hemorrhage, hydrocephalus, mass lesion, acute infarction, or significant intracranial injury. Vascular: No hyperdense vessel or unexpected calcification. Skull: No acute calvarial abnormality. Sinuses/Orbits: Visualized paranasal sinuses and mastoids clear. Orbital soft tissues unremarkable. Other: None IMPRESSION: No acute intracranial abnormality. Electronically Signed   By: Rolm Baptise M.D.   On: 07/04/2018 10:22  Ct Angio Neck W Or Wo Contrast  Result Date: 07/02/2018 CLINICAL DATA:  Initial evaluation for slurred speech. EXAM: CT ANGIOGRAPHY HEAD AND NECK TECHNIQUE: Multidetector CT imaging of the head and neck was performed using the standard protocol during bolus administration of intravenous contrast. Multiplanar CT image reconstructions and MIPs were obtained to evaluate the vascular anatomy. Carotid stenosis measurements (when applicable) are obtained utilizing NASCET criteria, using the distal internal carotid diameter as the denominator. CONTRAST:  45mL OMNIPAQUE IOHEXOL 350 MG/ML SOLN COMPARISON:  Prior CT from earlier the same day. FINDINGS: CTA NECK FINDINGS Aortic arch: Visualized aortic arch of normal caliber with normal branch pattern. Moderate atheromatous plaque  about the visualized arch and origin of the great vessels. Visualized subclavian arteries demonstrate scattered atheromatous irregularity but are patent without flow-limiting stenosis. Right carotid system: Atheromatous plaque at the origin of the right chronic carotid artery with mild approximate 25% narrowing. Right common carotid artery otherwise patent to the bifurcation without stenosis. Extensive atheromatous plaque about the right bifurcation. Resultant severe near occlusive stenosis of the proximal right ICA (series 6, image 143). Exact percentage difficult to discern, although is at least greater than 80%). No made of additional severe stenosis at the origin of the right external carotid artery. Right ICA patent distally to the skull base without stenosis, dissection, or occlusion. Left carotid system: Atheromatous plaque at the origin of the left common carotid artery with associated stenosis of approximately 40% by NASCET criteria. Left CCA demonstrates atheromatous plaque distally but is otherwise patent to the bifurcation without hemodynamically significant stenosis. Prominent mixed plaque about the left bifurcation/proximal left ICA with associated stenosis of up to approximate 50% by NASCET criteria. Note made of severe near occlusive stenosis at the origin of the left external carotid artery. Left ICA tortuous but otherwise patent to the skull base. Vertebral arteries: Left vertebral artery arises separately from the aortic arch. Right vertebral artery arises from the subclavian artery. Focal plaque at the origin of the right vertebral artery with approximate 40% stenosis. Focal plaque at the origin of the left vertebral artery with approximate 50% stenosis. Vertebral arteries otherwise patent within the neck. Right vertebral artery dominant. Skeleton: No acute osseus abnormality. No discrete lytic or blastic osseous lesions. Moderate cervical spondylolysis noted. Other neck: No other acute soft  tissue abnormality within the neck. Upper chest: Visualized upper chest demonstrates no acute finding. Left-sided pacemaker/AICD within normal limits. Visualized upper lungs are clear. Review of the MIP images confirms the above findings CTA HEAD FINDINGS Anterior circulation: Petrous segments widely patent bilaterally. Scattered multifocal atheromatous plaque within the cavernous/supraclinoid ICAs with mild multifocal narrowing. ICA termini widely patent. A1 segments patent bilaterally. Normal anterior communicating artery. Anterior cerebral arteries patent to their distal aspects without high-grade stenosis. M1 segments patent without stenosis. Normal MCA bifurcations. No proximal M2 occlusion. Distal MCA branches well perfused and symmetric. Posterior circulation: Vertebral arteries patent to the vertebrobasilar junction without stenosis. Right vertebral artery dominant. Posterior inferior cerebral arteries patent bilaterally. Basilar widely patent to its distal aspect. Superior cerebral arteries patent bilaterally. Both of the posterior cerebral artery supplied via the basilar and are well perfused to their distal aspects without stenosis. Venous sinuses: Patent. Anatomic variants: None significant.  No aneurysm. Delayed phase: No abnormal enhancement. Review of the MIP images confirms the above findings IMPRESSION: 1. Negative CTA for large vessel occlusion. 2. Heavy atheromatous plaque about the right carotid bifurcation with severe near occlusive (at least 80%) stenosis by NASCET criteria. 3. Approximate  50% atheromatous stenosis at the left carotid bifurcation. 4. Moderate atherosclerotic change about the aortic arch and origin of the great vessels. Associated 40-50% stenosis at the origin of the vertebral arteries bilaterally. Posterior circulation otherwise widely patent. 5. Incidental severe near occlusive stenoses at the origin of the external carotid arteries bilaterally. Electronically Signed   By:  Jeannine Boga M.D.   On: 07/02/2018 19:26    EKG:   Orders placed or performed during the hospital encounter of 07/02/18  . ED EKG  . ED EKG  . EKG 12-Lead  . EKG 12-Lead      Management plans discussed with the patient, family and they are in agreement.  CODE STATUS:     Code Status Orders  (From admission, onward)         Start     Ordered   07/02/18 1326  Full code  Continuous     07/02/18 1327        Code Status History    Date Active Date Inactive Code Status Order ID Comments User Context   11/07/2016 2347 11/09/2016 1428 Full Code [id number]  Hugelmeyer, Ubaldo Glassing, DO Inpatient      TOTAL TIME TAKING CARE OF THIS PATIENT: 35 minutes.    Vaughan Basta M.D on 07/04/2018 at 2:31 PM  Between 7am to 6pm - Pager - [phone]  After 6pm go to [email or web address] - password [email or web address] [facility]  Office  [phone]  CC: Primary care physician; Jerrol Banana., MD   Note: This dictation was prepared with Dragon dictation along with smaller phrase technology. Any transcriptional errors that result from this process are unintentional.

## 2018-07-04 NOTE — Evaluation (Signed)
Clinical/Bedside Swallow Evaluation Patient Details  Name: Anna Marsh MRN: [id number] Date of Birth: 23-Sep-1946  Today's Date: 07/04/2018 Time: SLP Start Time (ACUTE ONLY): 0820 SLP Stop Time (ACUTE ONLY): 0900 SLP Time Calculation (min) (ACUTE ONLY): 40 min  Past Medical History:  Past Medical History:  Diagnosis Date  . AICD (automatic cardioverter/defibrillator) present   . Anxiety   . AV block, complete (HCC)   . Cervical radiculopathy   . CHF (congestive heart failure) ([place])   . Chronic systolic heart failure ([place])   . GERD (gastroesophageal reflux disease)   . History of cardiac arrest   . Hypercholesteremia   . Hypertension   . Pre-diabetes   . 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 Right arm weakness    Past Surgical History:  Past Surgical History:  Procedure Laterality Date  . ABDOMINAL HYSTERECTOMY    . CATARACT EXTRACTION W/PHACO Left 12/18/2016   Procedure: CATARACT EXTRACTION PHACO AND INTRAOCULAR LENS PLACEMENT ([place])  left;  Surgeon: Ronnell Freshwater, MD;  Location: [place];  Service: Ophthalmology;  Laterality: Left;  . CATARACT EXTRACTION W/PHACO Right 01/01/2017   Procedure: CATARACT EXTRACTION PHACO AND INTRAOCULAR LENS PLACEMENT ([place])  Right;  Surgeon: Ronnell Freshwater, MD;  Location: [place];  Service: Ophthalmology;  Laterality: Right;  . HERNIA REPAIR     umbilical x 2  . NECK SURGERY    . PACEMAKER IMPLANT  02/03/2015   [facility]  . TONSILLECTOMY     HPI:  Per admitting H&P: Anna Marsh  is a 72 y.o. female with a known history of AICD, pacemaker, anxiety, congestive heart failure, GERD, hypercholesteremia, essential hypertension who is presenting with complaint of a aphasia.  According to patient and her daughter she has not been feeling well recently.  She passed out last week.  And was noted to have a UTI.  Patient in the ER CT scan of the head was negative.  She currently  continues to have some expressive aphasia.   Assessment / Plan / Recommendation Clinical Impression  Patient appears to presents with functional swallowing abilities at bedside. No s/s aspiration noted with any consistency tested. Oral phase WFL, adequate oral prep, coordination and A-P transit time. No oral residue noted. No facial or lingual asymmetry noted. ROM & strength of all oral motor structures WFL. Swallow initiation appeared timely. Vocal quality remained clear throughout evaluation. Upon admission, patient presented with dysarthria c/b slurred speech accompanied by L facial droop; however patient now appears much improved presenting this morning with clear, 100% intelligible speech with adequate breath support. Noted occasional decreased rate of speech requiring mild increased effort to produce clear speech. No apparent evidence of aphasia or cognitive linguistic deficits. Patient reports she feels her speech as returned to baseline and does not report any word finding difficulties. Discussed patient may request SLP consult on f/u with MD after d/c if she feels her speech has not completely resolved after returning home. Recommend continue with current Regular diet with thin liquids, meds may be given whole with thin liquid. SLP to sign off at this time, no needs identified. Please re-consult with any future change in status requiring re-evaluation. SLP Visit Diagnosis: Dysphagia, unspecified (R13.10)    Aspiration Risk  No limitations    Diet Recommendation Regular;Thin liquid   Liquid Administration via: Cup Medication Administration: Whole meds with liquid Supervision: Patient able to self feed Compensations: Minimize environmental distractions;Slow rate;Small sips/bites Postural Changes: Seated upright  at 90 degrees    Other  Recommendations Oral Care Recommendations: Oral care BID   Follow up Recommendations None      Frequency and Duration Other (Comment)(no needs identified)   Other (Comment)(no needs identified)       Prognosis        Swallow Study   General Date of Onset: 07/02/18 HPI: Per admitting H&P: Anna Marsh  is a 72 y.o. female with a known history of AICD, pacemaker, anxiety, congestive heart failure, GERD, hypercholesteremia, essential hypertension who is presenting with complaint of a aphasia.  According to patient and her daughter she has not been feeling well recently.  She passed out last week.  And was noted to have a UTI.  Patient in the ER CT scan of the head was negative.  She currently continues to have some expressive aphasia. Type of Study: Bedside Swallow Evaluation Diet Prior to this Study: Regular;Thin liquids Temperature Spikes Noted: No Respiratory Status: Room air History of Recent Intubation: No Behavior/Cognition: Alert;Cooperative;Pleasant mood Oral Cavity Assessment: Within Functional Limits Oral Care Completed by SLP: No Oral Cavity - Dentition: Adequate natural dentition Vision: Functional for self-feeding Self-Feeding Abilities: Able to feed self Patient Positioning: Upright in bed Baseline Vocal Quality: Normal Volitional Cough: Strong Volitional Swallow: Able to elicit    Oral/Motor/Sensory Function Overall Oral Motor/Sensory Function: Within functional limits   Ice Chips Ice chips: Not tested   Thin Liquid Thin Liquid: Within functional limits Presentation: Cup;Self Fed    Nectar Thick Nectar Thick Liquid: Not tested   Honey Thick Honey Thick Liquid: Not tested   Puree Puree: Not tested   Solid     Solid: Within functional limits Presentation: Self Fed      Sinclair Arrazola, MA, CCC-SLP 07/04/2018,9:07 AM

## 2018-07-04 NOTE — Progress Notes (Signed)
Discharge instructions given and went over with patient and patients daughter at bedside. Prescription given and reviewed. All questions answered. Patient discharged home with daughter. Madlyn Frankel, RN

## 2018-07-04 NOTE — Progress Notes (Signed)
Subjective: Patient at baseline.  Awake and alert.  No new neurological complaints.  Objective: Current vital signs: BP (!) 149/75   Pulse 62   Temp 97.7 F (36.5 C) (Oral)   Resp 18   Ht 5\' 2"  (1.575 m)   Wt 114.7 kg   SpO2 99%   BMI 46.24 kg/m  Vital signs in last 24 hours: Temp:  [97.4 F (36.3 C)-98.2 F (36.8 C)] 97.7 F (36.5 C) (10/03 1208) Pulse Rate:  [57-67] 62 (10/03 1208) Resp:  [18-20] 18 (10/03 0536) BP: (133-151)/(43-75) 149/75 (10/03 1208) SpO2:  [95 %-99 %] 99 % (10/03 1208)  Intake/Output from previous day: No intake/output data recorded. Intake/Output this shift: Total I/O In: 600 [P.O.:600] Out: -  Nutritional status:  Diet Order            Diet Heart Room service appropriate? Yes; Fluid consistency: Thin  Diet effective now              Neurologic Exam: Mental Status: Alert, oriented, thought content appropriate.  Speech fluent without evidence of aphasia.  Able to follow 3 step commands without difficulty. Cranial Nerves: II: Discs flat bilaterally; Visual fields grossly normal, pupils equal, round, reactive to light and accommodation III,IV, VI: ptosis not present, extra-ocular motions intact bilaterally V,VII: smile symmetric, facial light touch sensation normal bilaterally VIII: hearing normal bilaterally IX,X: gag reflex present XI: bilateral shoulder shrug XII: midline tongue extension Motor: 5/5 bilaterally Sensory: Pinprick and light touch intact throughout, bilaterally  Lab Results: Basic Metabolic Panel: Recent Labs  Lab 07/02/18 1139  NA 141  K 3.9  CL 106  CO2 29  GLUCOSE 100*  BUN 21  CREATININE 0.84  CALCIUM 9.0    Liver Function Tests: Recent Labs  Lab 07/02/18 1139  AST 19  ALT 14  ALKPHOS 103  BILITOT 0.5  PROT 6.8  ALBUMIN 3.7   No results for input(s): LIPASE, AMYLASE in the last 168 hours. No results for input(s): AMMONIA in the last 168 hours.  CBC: Recent Labs  Lab 07/02/18 1139  WBC  6.1  NEUTROABS 4.2  HGB 12.5  HCT 36.9  MCV 85.3  PLT 230    Cardiac Enzymes: Recent Labs  Lab 07/02/18 1139  TROPONINI <0.03    Lipid Panel: Recent Labs  Lab 07/03/18 0423  CHOL 150  TRIG 60  HDL 44  CHOLHDL 3.4  VLDL 12  LDLCALC 94    CBG: Recent Labs  Lab 07/02/18 1148  [place]    Microbiology: No results found for this or any previous visit.  Coagulation Studies: Recent Labs    07/02/18 1139  LABPROT 13.0  INR 0.99    Imaging: Ct Angio Head W Or Wo Contrast  Result Date: 07/02/2018 CLINICAL DATA:  Initial evaluation for slurred speech. EXAM: CT ANGIOGRAPHY HEAD AND NECK TECHNIQUE: Multidetector CT imaging of the head and neck was performed using the standard protocol during bolus administration of intravenous contrast. Multiplanar CT image reconstructions and MIPs were obtained to evaluate the vascular anatomy. Carotid stenosis measurements (when applicable) are obtained utilizing NASCET criteria, using the distal internal carotid diameter as the denominator. CONTRAST:  74mL OMNIPAQUE IOHEXOL 350 MG/ML SOLN COMPARISON:  Prior CT from earlier the same day. FINDINGS: CTA NECK FINDINGS Aortic arch: Visualized aortic arch of normal caliber with normal branch pattern. Moderate atheromatous plaque about the visualized arch and origin of the great vessels. Visualized subclavian arteries demonstrate scattered atheromatous irregularity but are patent without flow-limiting stenosis. Right  carotid system: Atheromatous plaque at the origin of the right chronic carotid artery with mild approximate 25% narrowing. Right common carotid artery otherwise patent to the bifurcation without stenosis. Extensive atheromatous plaque about the right bifurcation. Resultant severe near occlusive stenosis of the proximal right ICA (series 6, image 143). Exact percentage difficult to discern, although is at least greater than 80%). No made of additional severe stenosis at the origin of the  right external carotid artery. Right ICA patent distally to the skull base without stenosis, dissection, or occlusion. Left carotid system: Atheromatous plaque at the origin of the left common carotid artery with associated stenosis of approximately 40% by NASCET criteria. Left CCA demonstrates atheromatous plaque distally but is otherwise patent to the bifurcation without hemodynamically significant stenosis. Prominent mixed plaque about the left bifurcation/proximal left ICA with associated stenosis of up to approximate 50% by NASCET criteria. Note made of severe near occlusive stenosis at the origin of the left external carotid artery. Left ICA tortuous but otherwise patent to the skull base. Vertebral arteries: Left vertebral artery arises separately from the aortic arch. Right vertebral artery arises from the subclavian artery. Focal plaque at the origin of the right vertebral artery with approximate 40% stenosis. Focal plaque at the origin of the left vertebral artery with approximate 50% stenosis. Vertebral arteries otherwise patent within the neck. Right vertebral artery dominant. Skeleton: No acute osseus abnormality. No discrete lytic or blastic osseous lesions. Moderate cervical spondylolysis noted. Other neck: No other acute soft tissue abnormality within the neck. Upper chest: Visualized upper chest demonstrates no acute finding. Left-sided pacemaker/AICD within normal limits. Visualized upper lungs are clear. Review of the MIP images confirms the above findings CTA HEAD FINDINGS Anterior circulation: Petrous segments widely patent bilaterally. Scattered multifocal atheromatous plaque within the cavernous/supraclinoid ICAs with mild multifocal narrowing. ICA termini widely patent. A1 segments patent bilaterally. Normal anterior communicating artery. Anterior cerebral arteries patent to their distal aspects without high-grade stenosis. M1 segments patent without stenosis. Normal MCA bifurcations. No  proximal M2 occlusion. Distal MCA branches well perfused and symmetric. Posterior circulation: Vertebral arteries patent to the vertebrobasilar junction without stenosis. Right vertebral artery dominant. Posterior inferior cerebral arteries patent bilaterally. Basilar widely patent to its distal aspect. Superior cerebral arteries patent bilaterally. Both of the posterior cerebral artery supplied via the basilar and are well perfused to their distal aspects without stenosis. Venous sinuses: Patent. Anatomic variants: None significant.  No aneurysm. Delayed phase: No abnormal enhancement. Review of the MIP images confirms the above findings IMPRESSION: 1. Negative CTA for large vessel occlusion. 2. Heavy atheromatous plaque about the right carotid bifurcation with severe near occlusive (at least 80%) stenosis by NASCET criteria. 3. Approximate 50% atheromatous stenosis at the left carotid bifurcation. 4. Moderate atherosclerotic change about the aortic arch and origin of the great vessels. Associated 40-50% stenosis at the origin of the vertebral arteries bilaterally. Posterior circulation otherwise widely patent. 5. Incidental severe near occlusive stenoses at the origin of the external carotid arteries bilaterally. Electronically Signed   By: Jeannine Boga M.D.   On: 07/02/2018 19:26   Ct Head Wo Contrast  Result Date: 07/04/2018 CLINICAL DATA:  Stroke, follow-up, slurred speech. Left facial droop EXAM: CT HEAD WITHOUT CONTRAST TECHNIQUE: Contiguous axial images were obtained from the base of the skull through the vertex without intravenous contrast. COMPARISON:  07/02/2018 FINDINGS: Brain: No acute intracranial abnormality. Specifically, no hemorrhage, hydrocephalus, mass lesion, acute infarction, or significant intracranial injury. Vascular: No hyperdense vessel or unexpected calcification.  Skull: No acute calvarial abnormality. Sinuses/Orbits: Visualized paranasal sinuses and mastoids clear. Orbital  soft tissues unremarkable. Other: None IMPRESSION: No acute intracranial abnormality. Electronically Signed   By: Rolm Baptise M.D.   On: 07/04/2018 10:22   Ct Angio Neck W Or Wo Contrast  Result Date: 07/02/2018 CLINICAL DATA:  Initial evaluation for slurred speech. EXAM: CT ANGIOGRAPHY HEAD AND NECK TECHNIQUE: Multidetector CT imaging of the head and neck was performed using the standard protocol during bolus administration of intravenous contrast. Multiplanar CT image reconstructions and MIPs were obtained to evaluate the vascular anatomy. Carotid stenosis measurements (when applicable) are obtained utilizing NASCET criteria, using the distal internal carotid diameter as the denominator. CONTRAST:  81mL OMNIPAQUE IOHEXOL 350 MG/ML SOLN COMPARISON:  Prior CT from earlier the same day. FINDINGS: CTA NECK FINDINGS Aortic arch: Visualized aortic arch of normal caliber with normal branch pattern. Moderate atheromatous plaque about the visualized arch and origin of the great vessels. Visualized subclavian arteries demonstrate scattered atheromatous irregularity but are patent without flow-limiting stenosis. Right carotid system: Atheromatous plaque at the origin of the right chronic carotid artery with mild approximate 25% narrowing. Right common carotid artery otherwise patent to the bifurcation without stenosis. Extensive atheromatous plaque about the right bifurcation. Resultant severe near occlusive stenosis of the proximal right ICA (series 6, image 143). Exact percentage difficult to discern, although is at least greater than 80%). No made of additional severe stenosis at the origin of the right external carotid artery. Right ICA patent distally to the skull base without stenosis, dissection, or occlusion. Left carotid system: Atheromatous plaque at the origin of the left common carotid artery with associated stenosis of approximately 40% by NASCET criteria. Left CCA demonstrates atheromatous plaque distally  but is otherwise patent to the bifurcation without hemodynamically significant stenosis. Prominent mixed plaque about the left bifurcation/proximal left ICA with associated stenosis of up to approximate 50% by NASCET criteria. Note made of severe near occlusive stenosis at the origin of the left external carotid artery. Left ICA tortuous but otherwise patent to the skull base. Vertebral arteries: Left vertebral artery arises separately from the aortic arch. Right vertebral artery arises from the subclavian artery. Focal plaque at the origin of the right vertebral artery with approximate 40% stenosis. Focal plaque at the origin of the left vertebral artery with approximate 50% stenosis. Vertebral arteries otherwise patent within the neck. Right vertebral artery dominant. Skeleton: No acute osseus abnormality. No discrete lytic or blastic osseous lesions. Moderate cervical spondylolysis noted. Other neck: No other acute soft tissue abnormality within the neck. Upper chest: Visualized upper chest demonstrates no acute finding. Left-sided pacemaker/AICD within normal limits. Visualized upper lungs are clear. Review of the MIP images confirms the above findings CTA HEAD FINDINGS Anterior circulation: Petrous segments widely patent bilaterally. Scattered multifocal atheromatous plaque within the cavernous/supraclinoid ICAs with mild multifocal narrowing. ICA termini widely patent. A1 segments patent bilaterally. Normal anterior communicating artery. Anterior cerebral arteries patent to their distal aspects without high-grade stenosis. M1 segments patent without stenosis. Normal MCA bifurcations. No proximal M2 occlusion. Distal MCA branches well perfused and symmetric. Posterior circulation: Vertebral arteries patent to the vertebrobasilar junction without stenosis. Right vertebral artery dominant. Posterior inferior cerebral arteries patent bilaterally. Basilar widely patent to its distal aspect. Superior cerebral  arteries patent bilaterally. Both of the posterior cerebral artery supplied via the basilar and are well perfused to their distal aspects without stenosis. Venous sinuses: Patent. Anatomic variants: None significant.  No aneurysm. Delayed phase: No abnormal  enhancement. Review of the MIP images confirms the above findings IMPRESSION: 1. Negative CTA for large vessel occlusion. 2. Heavy atheromatous plaque about the right carotid bifurcation with severe near occlusive (at least 80%) stenosis by NASCET criteria. 3. Approximate 50% atheromatous stenosis at the left carotid bifurcation. 4. Moderate atherosclerotic change about the aortic arch and origin of the great vessels. Associated 40-50% stenosis at the origin of the vertebral arteries bilaterally. Posterior circulation otherwise widely patent. 5. Incidental severe near occlusive stenoses at the origin of the external carotid arteries bilaterally. Electronically Signed   By: Jeannine Boga M.D.   On: 07/02/2018 19:26    Medications:  I have reviewed the patient's current medications. Scheduled: .  stroke: mapping our early stages of recovery book   Does not apply Once  . aspirin  300 mg Rectal Daily   Or  . aspirin  325 mg Oral Daily  . atorvastatin  80 mg Oral q1800  . diphenhydrAMINE  50 mg Intravenous Once  . enoxaparin (LOVENOX) injection  40 mg Subcutaneous Q12H  . hydrocortisone sod succinate (SOLU-CORTEF) inj  200 mg Intravenous Once  . sacubitril-valsartan  1 tablet Oral BID    Assessment/Plan: No further neurological events.  Head CT repeated and reviewed. Shows no acute changes.  EEG unremarkable.  Echocardiogram shows no cardiac source of emboli with EF of 55-60%.    Recommendations: 1.  Start Eliquis.  D/C ASA. 2.  Patient to keep scheduled follow up appointment with vascular.  Family to obtain disc of films to take with them to appointment.     LOS: 0 days   Alexis Goodell, MD Neurology [phone] 07/04/2018  2:03  PM

## 2018-07-04 NOTE — Discharge Instructions (Signed)
Follow with vascular surgery in [facility] in 1 week.

## 2018-07-05 ENCOUNTER — Telehealth: Payer: Self-pay

## 2018-07-05 NOTE — Telephone Encounter (Signed)
Transition Care Management Follow-up Telephone Call  Date of discharge and from where: [facility] on 07/04/18.  How have you been since you were released from the hospital? Doing much better. Denies any new or worsening s/s.  Any questions or concerns? No   Items Reviewed:  Did the pt receive and understand the discharge instructions provided? Yes   Medications obtained and verified? Declines, will review at [facility].  Any new allergies since your discharge? No   Dietary orders reviewed? Yes  Do you have support at home? Yes  Other (ie: DME, Home Health, etc) N/A  Functional Questionnaire: (I = Independent and D = Dependent)  Bathing/Dressing- I   Meal Prep- I  Eating- I  Maintaining continence- I  Transferring/Ambulation- I  Managing Meds- I   Follow up appointments reviewed:    PCP Hospital f/u appt confirmed? Yes  Scheduled to see Dr Rosanna Randy on 07/10/18 @ 11:20 AM.  [facility] f/u appt confirmed? Yes    Are transportation arrangements needed? Yes   If their condition worsens, is the pt aware to call  their PCP or go to the ED? Yes  Was the patient provided with contact information for the PCP's office or ED? Yes  Was the pt encouraged to call back with questions or concerns? Yes

## 2018-07-08 ENCOUNTER — Other Ambulatory Visit: Payer: Self-pay | Admitting: Family Medicine

## 2018-07-10 ENCOUNTER — Ambulatory Visit (INDEPENDENT_AMBULATORY_CARE_PROVIDER_SITE_OTHER): Payer: Medicare Other | Admitting: Family Medicine

## 2018-07-10 VITALS — BP 128/71 | HR 73 | Temp 98.1°F | Resp 14 | Wt 255.0 lb

## 2018-07-10 DIAGNOSIS — I10 Essential (primary) hypertension: Secondary | ICD-10-CM | POA: Diagnosis not present

## 2018-07-10 DIAGNOSIS — I442 Atrioventricular block, complete: Secondary | ICD-10-CM

## 2018-07-10 DIAGNOSIS — G459 Transient cerebral ischemic attack, unspecified: Secondary | ICD-10-CM | POA: Diagnosis not present

## 2018-07-10 DIAGNOSIS — I480 Paroxysmal atrial fibrillation: Secondary | ICD-10-CM | POA: Diagnosis not present

## 2018-07-10 DIAGNOSIS — I6340 Cerebral infarction due to embolism of unspecified cerebral artery: Secondary | ICD-10-CM

## 2018-07-10 DIAGNOSIS — R7303 Prediabetes: Secondary | ICD-10-CM

## 2018-07-10 DIAGNOSIS — I5022 Chronic systolic (congestive) heart failure: Secondary | ICD-10-CM

## 2018-07-10 NOTE — Progress Notes (Signed)
Anna Marsh  MRN: [id number] DOB: [birth year]  Subjective:  HPI   The patient is a 72 year old female who presents for hospital follow up after admission for TIA.  The patient was in patient from 07/02/2018-07/04/2018.  She states that since the TIA she has left leg weakness and her speech is still a little slurred.  She has appointment with vascular at [facility] next week.    Discharge medication instructions: Medication List    STOP taking these medications   aspirin EC 325 MG tablet   furosemide 40 MG tablet Commonly known as:  LASIX   metoprolol tartrate 25 MG tablet Commonly known as:  LOPRESSOR     TAKE these medications   apixaban 5 MG Tabs tablet Commonly known as:  ELIQUIS Take 1 tablet (5 mg total) by mouth 2 (two) times daily.   atorvastatin 80 MG tablet Commonly known as:  LIPITOR TAKE 1 TABLET BY MOUTH  DAILY   butalbital-acetaminophen-caffeine 50-325-40 MG tablet Commonly known as:  FIORICET, ESGIC Take 1 tablet by mouth every 4 (four) hours as needed for headache.   cyanocobalamin 1000 MCG tablet Take 1,000 mcg by mouth daily.   gabapentin 100 MG capsule Commonly known as:  NEURONTIN Take 100 mg by mouth at bedtime.   loratadine 10 MG tablet Commonly known as:  CLARITIN Take 10 mg by mouth daily.   multivitamin with minerals tablet Take 1 tablet by mouth daily.   nitroGLYCERIN 0.4 MG SL tablet Commonly known as:  NITROSTAT Place 1 tablet (0.4 mg total) under the tongue every 5 (five) minutes as needed for chest pain.   omeprazole 20 MG capsule Commonly known as:  PRILOSEC TAKE 1 CAPSULE BY MOUTH  DAILY   ondansetron 4 MG disintegrating tablet Commonly known as:  ZOFRAN-ODT Take 1 tablet (4 mg total) by mouth every 8 (eight) hours as needed for nausea or vomiting.   potassium chloride 10 MEQ tablet Commonly known as:  K-DUR,KLOR-CON TAKE 1 TABLET BY MOUTH TWO  TIMES DAILY   sacubitril-valsartan 24-26 MG Commonly  known as:  ENTRESTO Take 1 tablet by mouth 2 (two) times daily.   sertraline 50 MG tablet Commonly known as:  ZOLOFT TAKE 1 TABLET BY MOUTH  DAILY      Patient Active Problem List   Diagnosis Date Noted  . TIA (transient ischemic attack) 11/07/2016  . Pre-diabetes 07/20/2015  . Acute anxiety 07/20/2015  . 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 GERD (gastroesophageal reflux disease) 02/28/2015  . Hypercholesterolemia 02/28/2015  . Hypertension 02/28/2015  . Cardiac defibrillator in place 02/28/2015  . Block, bundle branch, left 02/28/2015  . Adiposity 02/28/2015  . Acid reflux 02/28/2015  . 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 Chronic systolic CHF (congestive heart failure), NYHA class 3 ([place]) 02/24/2015  . H/O cardiac catheterization 02/24/2015  . 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 Cardiomyopathy ([place]) 02/01/2015  . Angina pectoris ([place]) 12/25/2013  . Arthritis 12/25/2013  . 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 AICD (automatic cardioverter/defibrillator) present   . Anxiety   . AV block, complete (HCC)   . Cervical radiculopathy   . CHF (congestive heart failure) ([place])   . Chronic systolic heart failure ([place])   . GERD (gastroesophageal reflux disease)   . History of cardiac arrest   . Hypercholesteremia   . Hypertension   . Pre-diabetes   . Presence of permanent cardiac pacemaker  02/03/2015   [facility]  with AICD  . Right arm weakness     Social History   Socioeconomic History  . Marital status: Widowed    Spouse name: Not on file  . Number of children: Not on file  . Years of education: Not on file  . Highest education level: Not on file  Occupational History  . Not on file  Social Needs  . Financial resource strain: Not on file  . Food insecurity:    Worry: Not on file    Inability: Not on file  . Transportation needs:    Medical: Not on file    Non-medical:  Not on file  Tobacco Use  . Smoking status: Former Smoker    Packs/day: 0.50    Years: 20.00    Pack years: 10.00    Last attempt to quit: 10/03/2003    Years since quitting: 14.7  . Smokeless tobacco: Never Used  Substance and Sexual Activity  . Alcohol use: No  . Drug use: No  . Sexual activity: Never  Lifestyle  . Physical activity:    Days per week: Not on file    Minutes per session: Not on file  . Stress: Not on file  Relationships  . Social connections:    Talks on phone: Not on file    Gets together: Not on file    Attends religious service: Not on file    Active member of club or organization: Not on file    Attends meetings of clubs or organizations: Not on file    Relationship status: Not on file  . Intimate partner violence:    Fear of current or ex partner: Not on file    Emotionally abused: Not on file    Physically abused: Not on file    Forced sexual activity: Not on file  Other Topics Concern  . Not on file  Social History Narrative  . Not on file    Outpatient Encounter Medications as of 07/10/2018  Medication Sig  . apixaban (ELIQUIS) 5 MG TABS tablet Take 1 tablet (5 mg total) by mouth 2 (two) times daily.  Marland Kitchen atorvastatin (LIPITOR) 80 MG tablet TAKE 1 TABLET BY MOUTH  DAILY (Patient taking differently: Take 80 mg by mouth daily. )  . butalbital-acetaminophen-caffeine (FIORICET, ESGIC) 50-325-40 MG tablet Take 1 tablet by mouth every 4 (four) hours as needed for headache.  . cyanocobalamin 1000 MCG tablet Take 1,000 mcg by mouth daily.   Marland Kitchen gabapentin (NEURONTIN) 100 MG capsule Take 100 mg by mouth at bedtime.  Marland Kitchen loratadine (CLARITIN) 10 MG tablet Take 10 mg by mouth daily.   . Multiple Vitamins-Minerals (MULTIVITAMIN WITH MINERALS) tablet Take 1 tablet by mouth daily.  Marland Kitchen omeprazole (PRILOSEC) 20 MG capsule TAKE 1 CAPSULE BY MOUTH  DAILY (Patient taking differently: Take 20 mg by mouth daily. )  . ondansetron (ZOFRAN ODT) 4 MG disintegrating tablet Take 1  tablet (4 mg total) by mouth every 8 (eight) hours as needed for nausea or vomiting.  . potassium chloride (K-DUR,KLOR-CON) 10 MEQ tablet TAKE 1 TABLET BY MOUTH TWO  TIMES DAILY (Patient taking differently: Take 10 mEq by mouth 2 (two) times daily. )  . sacubitril-valsartan (ENTRESTO) 24-26 MG Take 1 tablet by mouth 2 (two) times daily.  . sertraline (ZOLOFT) 50 MG tablet TAKE 1 TABLET BY MOUTH  DAILY  . nitroGLYCERIN (NITROSTAT) 0.4 MG SL tablet Place 1 tablet (0.4 mg total) under the tongue every 5 (five) minutes as  needed for chest pain.   No facility-administered encounter medications on file as of 07/10/2018.     Allergies  Allergen Reactions  . Iodinated Diagnostic Agents Other (See Comments)    Causes migraines  . Iodine     Contrast Dye causes migraines  . Lisinopril Cough  . Penicillins Rash    Has patient had a PCN reaction causing immediate rash, facial/tongue/throat swelling, SOB or lightheadedness with hypotension: Yes Has patient had a PCN reaction causing severe rash involving mucus membranes or skin necrosis: No Has patient had a PCN reaction that required hospitalization: No Has patient had a PCN reaction occurring within the last 10 years: Yes If all of the above answers are "NO", then may proceed with Cephalosporin use.    Review of Systems  Constitutional: Positive for malaise/fatigue. Negative for fever.  HENT: Negative.   Eyes: Negative.   Respiratory: Negative for cough, shortness of breath and wheezing.   Cardiovascular: Negative for chest pain, palpitations, orthopnea, claudication and leg swelling.  Gastrointestinal: Negative.   Skin: Negative.   Neurological: Positive for speech change and weakness. Negative for dizziness, tingling, tremors, sensory change and headaches.  Endo/Heme/Allergies: Negative.   Psychiatric/Behavioral: Negative.     Objective:  BP 128/71 (BP Location: Right Arm, Patient Position: Sitting, Cuff Size: Large)   Pulse 73   Temp  98.1 F (36.7 C) (Oral)   Resp 14   Wt 255 lb (115.7 kg)   BMI 46.64 kg/m   Physical Exam  Constitutional: She is oriented to person, place, and time and well-developed, well-nourished, and in no distress.  HENT:  Head: Normocephalic and atraumatic.  Right Ear: External ear normal.  Left Ear: External ear normal.  Nose: Nose normal.  Eyes: Conjunctivae are normal. No scleral icterus.  Neck: No thyromegaly present.  Cardiovascular: Normal rate, regular rhythm and normal heart sounds.  Pulmonary/Chest: Effort normal and breath sounds normal.  Abdominal: Soft.  Neurological: She is alert and oriented to person, place, and time. Gait normal. GCS score is 15.  Mild speech difficulty and very mild left leg weakness.  Skin: Skin is warm and dry.  Psychiatric: Mood, memory, affect and judgment normal.    Assessment and Plan :  1. Cerebrovascular accident (CVA) due to embolism of cerebral artery ([place]) Small CVA due to Afib most likely. BVascular appt soon. 2. Paroxysmal atrial fibrillation [facility]) Cardiology next month.On Eliquis.  3. Essential hypertension   4. TIA (transient ischemic attack)   5. Complete atrioventricular block (HCC)   6. Chronic systolic CHF (congestive heart failure), NYHA class 3 ([place])   7. Pre-diabetes  I have done the exam and reviewed the chart and it is accurate to the best of my knowledge. Development worker, community has been used and  any errors in dictation or transcription are unintentional. Miguel Aschoff M.D. [facility]

## 2018-07-11 ENCOUNTER — Encounter: Payer: Self-pay | Admitting: Family Medicine

## 2018-07-13 ENCOUNTER — Ambulatory Visit
Admit: 2018-07-13 | Discharge: 2018-07-22 | Disposition: A | Discharge status: Skilled Nursing Facility | Payer: MEDICARE

## 2018-07-13 DIAGNOSIS — R1310 Dysphagia, unspecified: Principal | ICD-10-CM

## 2018-07-13 DIAGNOSIS — J479 Bronchiectasis, uncomplicated: Secondary | ICD-10-CM | POA: Diagnosis not present

## 2018-07-13 DIAGNOSIS — I700 Atherosclerosis of aorta: Secondary | ICD-10-CM | POA: Diagnosis not present

## 2018-07-13 DIAGNOSIS — R5381 Other malaise: Secondary | ICD-10-CM | POA: Diagnosis not present

## 2018-07-13 DIAGNOSIS — S3993XA Unspecified injury of pelvis, initial encounter: Secondary | ICD-10-CM | POA: Diagnosis not present

## 2018-07-13 DIAGNOSIS — I69354 Hemiplegia and hemiparesis following cerebral infarction affecting left non-dominant side: Secondary | ICD-10-CM | POA: Diagnosis not present

## 2018-07-13 DIAGNOSIS — I2510 Atherosclerotic heart disease of native coronary artery without angina pectoris: Secondary | ICD-10-CM | POA: Diagnosis not present

## 2018-07-13 DIAGNOSIS — I429 Cardiomyopathy, unspecified: Secondary | ICD-10-CM | POA: Diagnosis not present

## 2018-07-13 DIAGNOSIS — I10 Essential (primary) hypertension: Secondary | ICD-10-CM | POA: Diagnosis not present

## 2018-07-13 DIAGNOSIS — K219 Gastro-esophageal reflux disease without esophagitis: Secondary | ICD-10-CM | POA: Diagnosis not present

## 2018-07-13 DIAGNOSIS — R471 Dysarthria and anarthria: Secondary | ICD-10-CM | POA: Diagnosis not present

## 2018-07-13 DIAGNOSIS — S79922A Unspecified injury of left thigh, initial encounter: Secondary | ICD-10-CM | POA: Diagnosis not present

## 2018-07-13 DIAGNOSIS — Z4502 Encounter for adjustment and management of automatic implantable cardiac defibrillator: Secondary | ICD-10-CM | POA: Diagnosis not present

## 2018-07-13 DIAGNOSIS — Z91041 Radiographic dye allergy status: Secondary | ICD-10-CM | POA: Diagnosis not present

## 2018-07-13 DIAGNOSIS — I5022 Chronic systolic (congestive) heart failure: Secondary | ICD-10-CM | POA: Diagnosis not present

## 2018-07-13 DIAGNOSIS — I517 Cardiomegaly: Secondary | ICD-10-CM | POA: Diagnosis not present

## 2018-07-13 DIAGNOSIS — I4891 Unspecified atrial fibrillation: Secondary | ICD-10-CM | POA: Diagnosis not present

## 2018-07-13 DIAGNOSIS — R29898 Other symptoms and signs involving the musculoskeletal system: Secondary | ICD-10-CM | POA: Diagnosis not present

## 2018-07-13 DIAGNOSIS — Z87891 Personal history of nicotine dependence: Secondary | ICD-10-CM | POA: Diagnosis not present

## 2018-07-13 DIAGNOSIS — J309 Allergic rhinitis, unspecified: Secondary | ICD-10-CM | POA: Diagnosis not present

## 2018-07-13 DIAGNOSIS — I5020 Unspecified systolic (congestive) heart failure: Secondary | ICD-10-CM | POA: Diagnosis not present

## 2018-07-13 DIAGNOSIS — Z7982 Long term (current) use of aspirin: Secondary | ICD-10-CM | POA: Diagnosis not present

## 2018-07-13 DIAGNOSIS — E785 Hyperlipidemia, unspecified: Secondary | ICD-10-CM | POA: Diagnosis not present

## 2018-07-13 DIAGNOSIS — I6521 Occlusion and stenosis of right carotid artery: Secondary | ICD-10-CM | POA: Diagnosis not present

## 2018-07-13 DIAGNOSIS — I340 Nonrheumatic mitral (valve) insufficiency: Secondary | ICD-10-CM | POA: Diagnosis not present

## 2018-07-13 DIAGNOSIS — I63231 Cerebral infarction due to unspecified occlusion or stenosis of right carotid arteries: Secondary | ICD-10-CM | POA: Diagnosis not present

## 2018-07-13 DIAGNOSIS — R4781 Slurred speech: Secondary | ICD-10-CM | POA: Diagnosis not present

## 2018-07-13 DIAGNOSIS — R29700 NIHSS score 0: Secondary | ICD-10-CM | POA: Diagnosis not present

## 2018-07-13 DIAGNOSIS — S2232XA Fracture of one rib, left side, initial encounter for closed fracture: Secondary | ICD-10-CM | POA: Diagnosis not present

## 2018-07-13 DIAGNOSIS — I6522 Occlusion and stenosis of left carotid artery: Secondary | ICD-10-CM | POA: Diagnosis not present

## 2018-07-13 DIAGNOSIS — I959 Hypotension, unspecified: Secondary | ICD-10-CM | POA: Diagnosis not present

## 2018-07-13 DIAGNOSIS — Z95810 Presence of automatic (implantable) cardiac defibrillator: Secondary | ICD-10-CM | POA: Diagnosis not present

## 2018-07-13 DIAGNOSIS — E569 Vitamin deficiency, unspecified: Secondary | ICD-10-CM | POA: Diagnosis not present

## 2018-07-13 DIAGNOSIS — Z8679 Personal history of other diseases of the circulatory system: Secondary | ICD-10-CM | POA: Diagnosis not present

## 2018-07-13 DIAGNOSIS — M6281 Muscle weakness (generalized): Secondary | ICD-10-CM | POA: Diagnosis not present

## 2018-07-13 DIAGNOSIS — Z794 Long term (current) use of insulin: Secondary | ICD-10-CM | POA: Diagnosis not present

## 2018-07-13 DIAGNOSIS — E861 Hypovolemia: Secondary | ICD-10-CM | POA: Diagnosis not present

## 2018-07-13 DIAGNOSIS — I252 Old myocardial infarction: Secondary | ICD-10-CM | POA: Diagnosis not present

## 2018-07-13 DIAGNOSIS — M4802 Spinal stenosis, cervical region: Secondary | ICD-10-CM | POA: Diagnosis not present

## 2018-07-13 DIAGNOSIS — I5030 Unspecified diastolic (congestive) heart failure: Secondary | ICD-10-CM | POA: Diagnosis not present

## 2018-07-13 DIAGNOSIS — I110 Hypertensive heart disease with heart failure: Secondary | ICD-10-CM | POA: Diagnosis not present

## 2018-07-13 DIAGNOSIS — G8194 Hemiplegia, unspecified affecting left nondominant side: Secondary | ICD-10-CM | POA: Diagnosis not present

## 2018-07-13 DIAGNOSIS — E860 Dehydration: Secondary | ICD-10-CM | POA: Diagnosis not present

## 2018-07-13 DIAGNOSIS — Z880 Allergy status to penicillin: Secondary | ICD-10-CM | POA: Diagnosis not present

## 2018-07-13 DIAGNOSIS — R531 Weakness: Secondary | ICD-10-CM | POA: Diagnosis not present

## 2018-07-13 DIAGNOSIS — I209 Angina pectoris, unspecified: Secondary | ICD-10-CM | POA: Diagnosis not present

## 2018-07-13 DIAGNOSIS — E876 Hypokalemia: Secondary | ICD-10-CM | POA: Diagnosis not present

## 2018-07-13 DIAGNOSIS — E119 Type 2 diabetes mellitus without complications: Secondary | ICD-10-CM | POA: Diagnosis not present

## 2018-07-13 DIAGNOSIS — Z950 Presence of cardiac pacemaker: Secondary | ICD-10-CM | POA: Diagnosis not present

## 2018-07-13 DIAGNOSIS — R918 Other nonspecific abnormal finding of lung field: Secondary | ICD-10-CM | POA: Diagnosis not present

## 2018-07-13 DIAGNOSIS — I639 Cerebral infarction, unspecified: Secondary | ICD-10-CM | POA: Diagnosis not present

## 2018-07-13 DIAGNOSIS — I6523 Occlusion and stenosis of bilateral carotid arteries: Secondary | ICD-10-CM | POA: Diagnosis not present

## 2018-07-13 DIAGNOSIS — R279 Unspecified lack of coordination: Secondary | ICD-10-CM | POA: Diagnosis not present

## 2018-07-14 DIAGNOSIS — R1310 Dysphagia, unspecified: Principal | ICD-10-CM

## 2018-07-15 DIAGNOSIS — R1310 Dysphagia, unspecified: Principal | ICD-10-CM

## 2018-07-16 DIAGNOSIS — R1310 Dysphagia, unspecified: Principal | ICD-10-CM

## 2018-07-16 DIAGNOSIS — I6521 Occlusion and stenosis of right carotid artery: Secondary | ICD-10-CM | POA: Insufficient documentation

## 2018-07-16 DIAGNOSIS — I639 Cerebral infarction, unspecified: Secondary | ICD-10-CM | POA: Insufficient documentation

## 2018-07-17 DIAGNOSIS — R1310 Dysphagia, unspecified: Principal | ICD-10-CM

## 2018-07-17 MED ORDER — RIVAROXABAN 15 MG TABLET
ORAL_TABLET | Freq: Two times a day (BID) | ORAL | 0 refills | 0.00000 days
Start: 2018-07-17 — End: 2018-07-17

## 2018-07-17 MED ORDER — RIVAROXABAN 20 MG TABLET
ORAL_TABLET | Freq: Every day | ORAL | 0 refills | 0.00000 days
Start: 2018-07-17 — End: 2018-07-17

## 2018-07-18 DIAGNOSIS — R1310 Dysphagia, unspecified: Principal | ICD-10-CM

## 2018-07-19 DIAGNOSIS — R1310 Dysphagia, unspecified: Principal | ICD-10-CM

## 2018-07-19 MED ORDER — ENOXAPARIN 100 MG/ML SUBCUTANEOUS SYRINGE
Freq: Two times a day (BID) | SUBCUTANEOUS | 0 refills | 0.00000 days
Start: 2018-07-19 — End: 2018-07-19

## 2018-07-22 DIAGNOSIS — S2232XD Fracture of one rib, left side, subsequent encounter for fracture with routine healing: Secondary | ICD-10-CM | POA: Diagnosis not present

## 2018-07-22 DIAGNOSIS — I6521 Occlusion and stenosis of right carotid artery: Secondary | ICD-10-CM | POA: Diagnosis not present

## 2018-07-22 DIAGNOSIS — I2510 Atherosclerotic heart disease of native coronary artery without angina pectoris: Secondary | ICD-10-CM | POA: Diagnosis not present

## 2018-07-22 DIAGNOSIS — E782 Mixed hyperlipidemia: Secondary | ICD-10-CM | POA: Diagnosis not present

## 2018-07-22 DIAGNOSIS — I639 Cerebral infarction, unspecified: Secondary | ICD-10-CM | POA: Diagnosis not present

## 2018-07-22 DIAGNOSIS — M6281 Muscle weakness (generalized): Secondary | ICD-10-CM | POA: Diagnosis not present

## 2018-07-22 DIAGNOSIS — J309 Allergic rhinitis, unspecified: Secondary | ICD-10-CM | POA: Diagnosis not present

## 2018-07-22 DIAGNOSIS — E785 Hyperlipidemia, unspecified: Secondary | ICD-10-CM | POA: Diagnosis not present

## 2018-07-22 DIAGNOSIS — I5022 Chronic systolic (congestive) heart failure: Secondary | ICD-10-CM | POA: Diagnosis not present

## 2018-07-22 DIAGNOSIS — I209 Angina pectoris, unspecified: Secondary | ICD-10-CM | POA: Diagnosis not present

## 2018-07-22 DIAGNOSIS — R279 Unspecified lack of coordination: Secondary | ICD-10-CM | POA: Diagnosis not present

## 2018-07-22 DIAGNOSIS — I482 Chronic atrial fibrillation, unspecified: Secondary | ICD-10-CM | POA: Diagnosis not present

## 2018-07-22 DIAGNOSIS — I69354 Hemiplegia and hemiparesis following cerebral infarction affecting left non-dominant side: Secondary | ICD-10-CM | POA: Diagnosis not present

## 2018-07-22 DIAGNOSIS — R5381 Other malaise: Secondary | ICD-10-CM | POA: Diagnosis not present

## 2018-07-22 DIAGNOSIS — I6350 Cerebral infarction due to unspecified occlusion or stenosis of unspecified cerebral artery: Secondary | ICD-10-CM | POA: Diagnosis not present

## 2018-07-22 DIAGNOSIS — K219 Gastro-esophageal reflux disease without esophagitis: Secondary | ICD-10-CM | POA: Diagnosis not present

## 2018-07-22 DIAGNOSIS — E876 Hypokalemia: Secondary | ICD-10-CM | POA: Diagnosis not present

## 2018-07-22 DIAGNOSIS — I5030 Unspecified diastolic (congestive) heart failure: Secondary | ICD-10-CM | POA: Diagnosis not present

## 2018-07-22 DIAGNOSIS — I10 Essential (primary) hypertension: Secondary | ICD-10-CM | POA: Diagnosis not present

## 2018-07-22 DIAGNOSIS — E569 Vitamin deficiency, unspecified: Secondary | ICD-10-CM | POA: Diagnosis not present

## 2018-07-22 DIAGNOSIS — I63231 Cerebral infarction due to unspecified occlusion or stenosis of right carotid arteries: Secondary | ICD-10-CM | POA: Diagnosis not present

## 2018-07-22 MED ORDER — LIDOCAINE 4 % TOPICAL CREAM
Freq: Two times a day (BID) | TOPICAL | 0 refills | 0.00000 days
Start: 2018-07-22 — End: 2019-07-22

## 2018-07-22 MED ORDER — LIDOCAINE 5 % TOPICAL PATCH
MEDICATED_PATCH | Freq: Every day | TRANSDERMAL | 0 refills | 0.00000 days | Status: SS
Start: 2018-07-22 — End: 2018-08-09

## 2018-07-22 MED ORDER — ENOXAPARIN 80 MG/0.8 ML SUBCUTANEOUS SYRINGE
Freq: Two times a day (BID) | SUBCUTANEOUS | 0 refills | 0.00000 days | Status: SS
Start: 2018-07-22 — End: 2018-08-09

## 2018-07-22 MED ORDER — LOPERAMIDE 2 MG CAPSULE
ORAL_CAPSULE | Freq: Four times a day (QID) | ORAL | 0 refills | 0.00000 days | PRN
Start: 2018-07-22 — End: 2018-09-12

## 2018-07-22 MED ORDER — GABAPENTIN 100 MG CAPSULE
ORAL_CAPSULE | Freq: Every evening | ORAL | 0 refills | 0.00000 days
Start: 2018-07-22 — End: 2019-04-24

## 2018-07-22 MED ORDER — ACETAMINOPHEN 325 MG TABLET
Freq: Four times a day (QID) | ORAL | 0 refills | 0.00000 days | PRN
Start: 2018-07-22 — End: ?

## 2018-07-22 MED ORDER — ONDANSETRON 4 MG DISINTEGRATING TABLET
Freq: Two times a day (BID) | ORAL | 0 refills | 0.00000 days | PRN
Start: 2018-07-22 — End: 2018-08-09

## 2018-07-24 DIAGNOSIS — I10 Essential (primary) hypertension: Secondary | ICD-10-CM | POA: Diagnosis not present

## 2018-07-24 DIAGNOSIS — I6350 Cerebral infarction due to unspecified occlusion or stenosis of unspecified cerebral artery: Secondary | ICD-10-CM | POA: Diagnosis not present

## 2018-07-24 DIAGNOSIS — E785 Hyperlipidemia, unspecified: Secondary | ICD-10-CM | POA: Diagnosis not present

## 2018-07-24 DIAGNOSIS — S2232XD Fracture of one rib, left side, subsequent encounter for fracture with routine healing: Secondary | ICD-10-CM | POA: Diagnosis not present

## 2018-07-24 DIAGNOSIS — I6521 Occlusion and stenosis of right carotid artery: Secondary | ICD-10-CM | POA: Diagnosis not present

## 2018-07-25 ENCOUNTER — Ambulatory Visit: Admit: 2018-07-25 | Discharge: 2018-07-25 | Payer: MEDICARE

## 2018-07-25 ENCOUNTER — Encounter: Admit: 2018-07-25 | Discharge: 2018-07-25 | Payer: MEDICARE | Attending: Specialist | Primary: Specialist

## 2018-07-25 DIAGNOSIS — I63231 Cerebral infarction due to unspecified occlusion or stenosis of right carotid arteries: Principal | ICD-10-CM

## 2018-07-25 MED ORDER — CLOPIDOGREL 75 MG TABLET
ORAL_TABLET | Freq: Every day | ORAL | 3 refills | 0.00000 days | Status: CP
Start: 2018-07-25 — End: 2018-08-26

## 2018-07-25 MED ORDER — DIPHENHYDRAMINE 50 MG CAPSULE
ORAL_CAPSULE | ORAL | 3 refills | 0 days
Start: 2018-07-25 — End: 2019-04-17

## 2018-07-25 MED ORDER — PREDNISONE 50 MG TABLET
ORAL_TABLET | ORAL | 3 refills | 0 days | Status: CP
Start: 2018-07-25 — End: 2019-04-17

## 2018-07-25 MED ORDER — CLOPIDOGREL 300 MG TABLET
ORAL_TABLET | Freq: Once | ORAL | 0 refills | 0 days | Status: CP
Start: 2018-07-25 — End: 2018-08-09

## 2018-07-26 DIAGNOSIS — I5022 Chronic systolic (congestive) heart failure: Secondary | ICD-10-CM | POA: Diagnosis not present

## 2018-07-26 DIAGNOSIS — I480 Paroxysmal atrial fibrillation: Secondary | ICD-10-CM | POA: Insufficient documentation

## 2018-07-26 DIAGNOSIS — E782 Mixed hyperlipidemia: Secondary | ICD-10-CM | POA: Diagnosis not present

## 2018-07-26 DIAGNOSIS — I10 Essential (primary) hypertension: Secondary | ICD-10-CM | POA: Diagnosis not present

## 2018-07-26 DIAGNOSIS — I639 Cerebral infarction, unspecified: Secondary | ICD-10-CM | POA: Diagnosis not present

## 2018-07-26 DIAGNOSIS — I6521 Occlusion and stenosis of right carotid artery: Secondary | ICD-10-CM | POA: Diagnosis not present

## 2018-07-29 ENCOUNTER — Telehealth: Payer: Self-pay | Admitting: Family Medicine

## 2018-07-29 ENCOUNTER — Encounter: Admit: 2018-07-29 | Discharge: 2018-07-30 | Payer: MEDICARE

## 2018-07-29 DIAGNOSIS — I10 Essential (primary) hypertension: Secondary | ICD-10-CM

## 2018-07-29 DIAGNOSIS — I639 Cerebral infarction, unspecified: Principal | ICD-10-CM

## 2018-07-29 DIAGNOSIS — K219 Gastro-esophageal reflux disease without esophagitis: Secondary | ICD-10-CM

## 2018-07-29 DIAGNOSIS — I482 Chronic atrial fibrillation, unspecified: Secondary | ICD-10-CM

## 2018-07-29 DIAGNOSIS — I5030 Unspecified diastolic (congestive) heart failure: Secondary | ICD-10-CM

## 2018-07-29 NOTE — Telephone Encounter (Signed)
[name] Pt's daughter called wanting to know if we have received her FMLA papers for being out on intermediate leave with her mom.  She said [employer] told her they sent the papers in and will need them back back Nov 1.  The start date of leave should be Oct 2.  Please call her after these have been filled out.  Her CB# is  [phone]  Thanks  teri

## 2018-07-30 NOTE — Telephone Encounter (Signed)
Dr. Rosanna Randy is working on the forms

## 2018-08-05 NOTE — Telephone Encounter (Signed)
Completed and faxed, daughter has been advised.

## 2018-08-05 NOTE — Telephone Encounter (Signed)
Pt called saying she wants to talk to Dr. Rosanna Randy about Michelle's FMLA papers.  She has to have these papers completed today.  Pt's CB# is [phone]  Thanks  Con [place]

## 2018-08-06 DIAGNOSIS — Z4502 Encounter for adjustment and management of automatic implantable cardiac defibrillator: Secondary | ICD-10-CM

## 2018-08-06 DIAGNOSIS — I429 Cardiomyopathy, unspecified: Principal | ICD-10-CM

## 2018-08-06 DIAGNOSIS — I6522 Occlusion and stenosis of left carotid artery: Principal | ICD-10-CM

## 2018-08-07 ENCOUNTER — Telehealth: Payer: Self-pay | Admitting: Family Medicine

## 2018-08-07 ENCOUNTER — Institutional Professional Consult (permissible substitution)
Admit: 2018-08-07 | Discharge: 2018-08-09 | Disposition: A | Discharge status: Home / Self Care | Payer: MEDICARE | Admitting: Specialist

## 2018-08-07 ENCOUNTER — Ambulatory Visit
Admit: 2018-08-07 | Discharge: 2018-08-09 | Disposition: A | Discharge status: Home / Self Care | Payer: MEDICARE | Admitting: Specialist

## 2018-08-07 ENCOUNTER — Encounter
Admit: 2018-08-07 | Discharge: 2018-08-09 | Disposition: A | Discharge status: Home / Self Care | Payer: MEDICARE | Attending: Student in an Organized Health Care Education/Training Program | Admitting: Specialist

## 2018-08-07 DIAGNOSIS — I6522 Occlusion and stenosis of left carotid artery: Principal | ICD-10-CM

## 2018-08-07 DIAGNOSIS — I6521 Occlusion and stenosis of right carotid artery: Secondary | ICD-10-CM

## 2018-08-07 DIAGNOSIS — Z880 Allergy status to penicillin: Secondary | ICD-10-CM | POA: Diagnosis not present

## 2018-08-07 DIAGNOSIS — Z91041 Radiographic dye allergy status: Secondary | ICD-10-CM | POA: Diagnosis not present

## 2018-08-07 DIAGNOSIS — E119 Type 2 diabetes mellitus without complications: Secondary | ICD-10-CM | POA: Diagnosis not present

## 2018-08-07 DIAGNOSIS — D62 Acute posthemorrhagic anemia: Secondary | ICD-10-CM | POA: Diagnosis not present

## 2018-08-07 DIAGNOSIS — I69320 Aphasia following cerebral infarction: Secondary | ICD-10-CM | POA: Diagnosis not present

## 2018-08-07 DIAGNOSIS — R0989 Other specified symptoms and signs involving the circulatory and respiratory systems: Secondary | ICD-10-CM | POA: Diagnosis not present

## 2018-08-07 DIAGNOSIS — R001 Bradycardia, unspecified: Secondary | ICD-10-CM | POA: Diagnosis not present

## 2018-08-07 DIAGNOSIS — I5020 Unspecified systolic (congestive) heart failure: Secondary | ICD-10-CM | POA: Diagnosis not present

## 2018-08-07 DIAGNOSIS — Z87891 Personal history of nicotine dependence: Secondary | ICD-10-CM | POA: Diagnosis not present

## 2018-08-07 DIAGNOSIS — E785 Hyperlipidemia, unspecified: Secondary | ICD-10-CM | POA: Diagnosis not present

## 2018-08-07 DIAGNOSIS — G8918 Other acute postprocedural pain: Secondary | ICD-10-CM | POA: Diagnosis not present

## 2018-08-07 DIAGNOSIS — I480 Paroxysmal atrial fibrillation: Secondary | ICD-10-CM | POA: Diagnosis not present

## 2018-08-07 DIAGNOSIS — D649 Anemia, unspecified: Secondary | ICD-10-CM | POA: Diagnosis not present

## 2018-08-07 DIAGNOSIS — R918 Other nonspecific abnormal finding of lung field: Secondary | ICD-10-CM | POA: Diagnosis not present

## 2018-08-07 DIAGNOSIS — Z95828 Presence of other vascular implants and grafts: Secondary | ICD-10-CM | POA: Diagnosis not present

## 2018-08-07 DIAGNOSIS — Z8679 Personal history of other diseases of the circulatory system: Secondary | ICD-10-CM | POA: Diagnosis not present

## 2018-08-07 DIAGNOSIS — I9581 Postprocedural hypotension: Secondary | ICD-10-CM | POA: Diagnosis not present

## 2018-08-07 DIAGNOSIS — K219 Gastro-esophageal reflux disease without esophagitis: Secondary | ICD-10-CM | POA: Diagnosis not present

## 2018-08-07 DIAGNOSIS — Z95810 Presence of automatic (implantable) cardiac defibrillator: Secondary | ICD-10-CM | POA: Diagnosis not present

## 2018-08-07 DIAGNOSIS — Z7901 Long term (current) use of anticoagulants: Secondary | ICD-10-CM | POA: Diagnosis not present

## 2018-08-07 DIAGNOSIS — I2510 Atherosclerotic heart disease of native coronary artery without angina pectoris: Secondary | ICD-10-CM | POA: Diagnosis not present

## 2018-08-07 DIAGNOSIS — I69341 Monoplegia of lower limb following cerebral infarction affecting right dominant side: Secondary | ICD-10-CM | POA: Diagnosis not present

## 2018-08-07 DIAGNOSIS — I959 Hypotension, unspecified: Secondary | ICD-10-CM | POA: Diagnosis not present

## 2018-08-07 DIAGNOSIS — I428 Other cardiomyopathies: Secondary | ICD-10-CM | POA: Diagnosis not present

## 2018-08-07 NOTE — Telephone Encounter (Signed)
Daughter [name] [phone]   [employer] called and is needing more information. They will be faxing back paperwork due to needing to know the following: How long pt will need to be out - For a year.   How many days - 5 days a week if needed.  Please call [name] if any questions.  Thanks, [employer]

## 2018-08-07 NOTE — Telephone Encounter (Signed)
Will await fax.

## 2018-08-08 ENCOUNTER — Inpatient Hospital Stay: Payer: Medicare Other | Admitting: Family Medicine

## 2018-08-08 DIAGNOSIS — I6522 Occlusion and stenosis of left carotid artery: Principal | ICD-10-CM

## 2018-08-09 MED ORDER — OXYCODONE 5 MG TABLET
ORAL_TABLET | ORAL | 0 refills | 0.00000 days | PRN
Start: 2018-08-09 — End: 2018-08-14

## 2018-08-09 NOTE — Telephone Encounter (Signed)
Fax received and will need to wait until patient's OV to complete.  Information requested is not available in our records since we have not yet seen the patient since she had the stroke that incapacitated her.

## 2018-08-10 MED ORDER — ASPIRIN 81 MG CHEWABLE TABLET
ORAL_TABLET | Freq: Every day | ORAL | 0 refills | 0.00000 days | Status: CP
Start: 2018-08-10 — End: 2019-04-24

## 2018-08-12 ENCOUNTER — Encounter
Admit: 2018-08-12 | Discharge: 2018-08-13 | Payer: MEDICARE | Attending: Nurse Practitioner | Primary: Nurse Practitioner

## 2018-08-12 DIAGNOSIS — I442 Atrioventricular block, complete: Secondary | ICD-10-CM

## 2018-08-12 DIAGNOSIS — I5022 Chronic systolic (congestive) heart failure: Secondary | ICD-10-CM

## 2018-08-12 DIAGNOSIS — I447 Left bundle-branch block, unspecified: Secondary | ICD-10-CM

## 2018-08-12 DIAGNOSIS — Z9889 Other specified postprocedural states: Secondary | ICD-10-CM

## 2018-08-12 DIAGNOSIS — I209 Angina pectoris, unspecified: Secondary | ICD-10-CM

## 2018-08-12 DIAGNOSIS — I110 Hypertensive heart disease with heart failure: Secondary | ICD-10-CM | POA: Diagnosis not present

## 2018-08-12 DIAGNOSIS — Z95810 Presence of automatic (implantable) cardiac defibrillator: Secondary | ICD-10-CM | POA: Diagnosis not present

## 2018-08-12 DIAGNOSIS — Z7902 Long term (current) use of antithrombotics/antiplatelets: Secondary | ICD-10-CM | POA: Diagnosis not present

## 2018-08-12 DIAGNOSIS — I6529 Occlusion and stenosis of unspecified carotid artery: Secondary | ICD-10-CM | POA: Diagnosis not present

## 2018-08-12 DIAGNOSIS — E785 Hyperlipidemia, unspecified: Secondary | ICD-10-CM | POA: Diagnosis not present

## 2018-08-12 DIAGNOSIS — Z7982 Long term (current) use of aspirin: Secondary | ICD-10-CM | POA: Diagnosis not present

## 2018-08-12 DIAGNOSIS — E119 Type 2 diabetes mellitus without complications: Secondary | ICD-10-CM | POA: Diagnosis not present

## 2018-08-12 DIAGNOSIS — Z79899 Other long term (current) drug therapy: Secondary | ICD-10-CM | POA: Diagnosis not present

## 2018-08-12 DIAGNOSIS — Z880 Allergy status to penicillin: Secondary | ICD-10-CM | POA: Diagnosis not present

## 2018-08-12 DIAGNOSIS — R55 Syncope and collapse: Secondary | ICD-10-CM | POA: Diagnosis not present

## 2018-08-14 NOTE — Telephone Encounter (Signed)
[name] called about the FMLA forms. Pt is now scheduled for OV on 08/15/18 so forms can be completed. Thanks TNP

## 2018-08-15 ENCOUNTER — Ambulatory Visit (INDEPENDENT_AMBULATORY_CARE_PROVIDER_SITE_OTHER): Payer: Medicare Other | Admitting: Family Medicine

## 2018-08-15 VITALS — BP 130/50 | HR 66 | Temp 98.2°F | Resp 16 | Wt 257.0 lb

## 2018-08-15 DIAGNOSIS — I63233 Cerebral infarction due to unspecified occlusion or stenosis of bilateral carotid arteries: Secondary | ICD-10-CM | POA: Diagnosis not present

## 2018-08-15 DIAGNOSIS — I442 Atrioventricular block, complete: Secondary | ICD-10-CM | POA: Diagnosis not present

## 2018-08-15 DIAGNOSIS — I10 Essential (primary) hypertension: Secondary | ICD-10-CM | POA: Diagnosis not present

## 2018-08-15 MED ORDER — FUROSEMIDE 20 MG PO TABS: 20.0000 mg | ORAL_TABLET | Freq: Every day | ORAL | 11 refills | Status: DC | PRN

## 2018-08-15 NOTE — Progress Notes (Signed)
Anna Marsh  MRN: [id number] DOB: November 13, 1945  Subjective:  HPI   The patient is a 72 year old female who presents today for follow up after recent stroke.  Patient was hospitalized at [facility] 07/13/18-07/23/18.  After her admission she required nursing care and was admitted to [place] for post stroke care from 07/23/18 to 08/02/18.Transition care visit. Patient received PT and OT for general strengthening due to the temporary left leg weakness and aphasia secondary to her stroke.  Now notices if she gets tired that her speech is a little difficult and she has a little weakness in the left leg. Patient had carotid endarterectomy for carotid stenosis on 08/07/18.   She continues to have weakness of the left arm and leg.  She has post-op pain in the right groin.  She continues to have a little trouble with her speech.  She states it takes her longer to think of the work and she has to work a little harder to say them.  Patient Active Problem List   Diagnosis Date Noted  . TIA (transient ischemic attack) 11/07/2016  . Pre-diabetes 07/20/2015  . Acute anxiety 07/20/2015  . 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 GERD (gastroesophageal reflux disease) 02/28/2015  . Hypercholesterolemia 02/28/2015  . Hypertension 02/28/2015  . Cardiac defibrillator in place 02/28/2015  . Block, bundle branch, left 02/28/2015  . Adiposity 02/28/2015  . Acid reflux 02/28/2015  . 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 Chronic systolic CHF (congestive heart failure), NYHA class 3 ([place]) 02/24/2015  . H/O cardiac catheterization 02/24/2015  . 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 Cardiomyopathy ([place]) 02/01/2015  . Angina pectoris ([place]) 12/25/2013  . Arthritis 12/25/2013  . 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 AICD (automatic cardioverter/defibrillator) present   . Anxiety   . AV block, complete (HCC)   .  Cervical radiculopathy   . CHF (congestive heart failure) ([place])   . Chronic systolic heart failure ([place])   . GERD (gastroesophageal reflux disease)   . History of cardiac arrest   . Hypercholesteremia   . Hypertension   . Pre-diabetes   . 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 Right arm weakness     Social History   Socioeconomic History  . Marital status: Widowed    Spouse name: Not on file  . Number of children: Not on file  . Years of education: Not on file  . Highest education level: Not on file  Occupational History  . Not on file  Social Needs  . Financial resource strain: Not on file  . Food insecurity:    Worry: Not on file    Inability: Not on file  . Transportation needs:    Medical: Not on file    Non-medical: Not on file  Tobacco Use  . Smoking status: Former Smoker    Packs/day: 0.50    Years: 20.00    Pack years: 10.00    Last attempt to quit: 10/03/2003    Years since quitting: 14.8  . Smokeless tobacco: Never Used  Substance and Sexual Activity  . Alcohol use: No  . Drug use: No  . Sexual activity: Never  Lifestyle  . Physical activity:    Days per week: Not on file    Minutes per session: Not on file  . Stress: Not on file  Relationships  . Social connections:  Talks on phone: Not on file    Gets together: Not on file    Attends religious service: Not on file    Active member of club or organization: Not on file    Attends meetings of clubs or organizations: Not on file    Relationship status: Not on file  . Intimate partner violence:    Fear of current or ex partner: Not on file    Emotionally abused: Not on file    Physically abused: Not on file    Forced sexual activity: Not on file  Other Topics Concern  . Not on file  Social History Narrative  . Not on file    Outpatient Encounter Medications as of 08/15/2018  Medication Sig  . atorvastatin (LIPITOR) 80 MG tablet TAKE 1 TABLET BY MOUTH  DAILY  (Patient taking differently: Take 80 mg by mouth daily. )  . cyanocobalamin 1000 MCG tablet Take 1,000 mcg by mouth daily.   Marland Kitchen gabapentin (NEURONTIN) 100 MG capsule Take 100 mg by mouth at bedtime.  Marland Kitchen loratadine (CLARITIN) 10 MG tablet Take 10 mg by mouth daily.   . Multiple Vitamins-Minerals (MULTIVITAMIN WITH MINERALS) tablet Take 1 tablet by mouth daily.  . nitroGLYCERIN (NITROSTAT) 0.4 MG SL tablet Place 1 tablet (0.4 mg total) under the tongue every 5 (five) minutes as needed for chest pain.  Marland Kitchen omeprazole (PRILOSEC) 20 MG capsule TAKE 1 CAPSULE BY MOUTH  DAILY (Patient taking differently: Take 20 mg by mouth daily. )  . potassium chloride (K-DUR,KLOR-CON) 10 MEQ tablet TAKE 1 TABLET BY MOUTH TWO  TIMES DAILY (Patient taking differently: Take 10 mEq by mouth 2 (two) times daily. )  . sacubitril-valsartan (ENTRESTO) 24-26 MG Take 1 tablet by mouth 2 (two) times daily.  . sertraline (ZOLOFT) 50 MG tablet TAKE 1 TABLET BY MOUTH  DAILY  . butalbital-acetaminophen-caffeine (FIORICET, ESGIC) 50-325-40 MG tablet Take 1 tablet by mouth every 4 (four) hours as needed for headache. (Patient not taking: Reported on 08/15/2018)  . [DISCONTINUED] apixaban (ELIQUIS) 5 MG TABS tablet Take 1 tablet (5 mg total) by mouth 2 (two) times daily.  . [DISCONTINUED] ondansetron (ZOFRAN ODT) 4 MG disintegrating tablet Take 1 tablet (4 mg total) by mouth every 8 (eight) hours as needed for nausea or vomiting.   No facility-administered encounter medications on file as of 08/15/2018.     Allergies  Allergen Reactions  . Iodinated Diagnostic Agents Other (See Comments)    Causes migraines  . Iodine     Contrast Dye causes migraines  . Lisinopril Cough  . Penicillins Rash    Has patient had a PCN reaction causing immediate rash, facial/tongue/throat swelling, SOB or lightheadedness with hypotension: Yes Has patient had a PCN reaction causing severe rash involving mucus membranes or skin necrosis: No Has patient  had a PCN reaction that required hospitalization: No Has patient had a PCN reaction occurring within the last 10 years: Yes If all of the above answers are "NO", then may proceed with Cephalosporin use.    Review of Systems  Constitutional: Positive for malaise/fatigue. Negative for fever.  Eyes: Negative for blurred vision and double vision.  Respiratory: Negative for cough, shortness of breath and wheezing.   Cardiovascular: Positive for leg swelling. Negative for chest pain, palpitations, orthopnea and claudication.  Gastrointestinal: Negative.   Skin: Negative.   Neurological: Negative for dizziness and headaches.  Endo/Heme/Allergies: Negative.   Psychiatric/Behavioral: Negative.     Objective:  BP (!) 130/50 (BP Location: Right Arm,  Patient Position: Sitting, Cuff Size: Large)   Pulse 66   Temp 98.2 F (36.8 C) (Oral)   Resp 16   Wt 257 lb (116.6 kg)   SpO2 94%   BMI 47.01 kg/m   Physical Exam  Constitutional: She is oriented to person, place, and time and well-developed, well-nourished, and in no distress.  HENT:  Head: Normocephalic and atraumatic.  Right Ear: External ear normal.  Left Ear: External ear normal.  Nose: Nose normal.  Mouth/Throat: Oropharynx is clear and moist.  Eyes: Conjunctivae are normal. No scleral icterus.  Neck: No thyromegaly present.  Cardiovascular: Normal rate, regular rhythm and normal heart sounds.  Pulmonary/Chest: Effort normal and breath sounds normal.  Abdominal: Soft.  Neurological: She is alert and oriented to person, place, and time. Gait normal. GCS score is 15.  Skin: Skin is warm and dry.  Psychiatric: Mood, memory, affect and judgment normal.    Assessment and Plan :  1. 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 Pt told she did not have Afib.All risk factor treated,  2. Essential hypertension   3. Complete atrioventricular block (HCC)  I have  done the exam and reviewed the chart and it is accurate to the best of my knowledge. Development worker, community has been used and  any errors in dictation or transcription are unintentional. Miguel Aschoff M.D. [facility]   HPI, Exam and A&P Transcribed under the direction and in the presence of Wilhemena Durie., MD. Electronically Signed: Althea Charon, [place]

## 2018-08-17 ENCOUNTER — Ambulatory Visit: Payer: Medicare Other | Admitting: Family Medicine

## 2018-08-21 ENCOUNTER — Ambulatory Visit: Payer: Medicare Other | Admitting: Family Medicine

## 2018-08-26 ENCOUNTER — Other Ambulatory Visit: Payer: Self-pay | Admitting: Family Medicine

## 2018-08-26 DIAGNOSIS — E7800 Pure hypercholesterolemia, unspecified: Secondary | ICD-10-CM

## 2018-08-26 MED ORDER — CLOPIDOGREL 75 MG TABLET
ORAL_TABLET | Freq: Every day | ORAL | 3 refills | 0 days | Status: CP
Start: 2018-08-26 — End: 2018-09-09

## 2018-09-04 MED ORDER — CLOPIDOGREL 75 MG TABLET
ORAL_TABLET | Freq: Every day | ORAL | 0 refills | 0.00000 days | Status: CP
Start: 2018-09-04 — End: 2018-10-31

## 2018-09-09 ENCOUNTER — Ambulatory Visit: Payer: Self-pay | Admitting: Family Medicine

## 2018-09-09 MED ORDER — CLOPIDOGREL 75 MG TABLET
ORAL_TABLET | Freq: Every day | ORAL | 3 refills | 0.00000 days | Status: CP
Start: 2018-09-09 — End: 2018-10-31

## 2018-09-12 ENCOUNTER — Ambulatory Visit (INDEPENDENT_AMBULATORY_CARE_PROVIDER_SITE_OTHER): Payer: Medicare Other | Admitting: Family Medicine

## 2018-09-12 ENCOUNTER — Encounter: Payer: Self-pay | Admitting: Family Medicine

## 2018-09-12 ENCOUNTER — Ambulatory Visit: Admit: 2018-09-12 | Discharge: 2018-09-12 | Payer: MEDICARE | Attending: Specialist | Primary: Specialist

## 2018-09-12 ENCOUNTER — Encounter
Admit: 2018-09-12 | Discharge: 2018-09-12 | Payer: MEDICARE | Attending: Cardiovascular Disease | Primary: Cardiovascular Disease

## 2018-09-12 ENCOUNTER — Encounter: Admit: 2018-09-12 | Discharge: 2018-09-12 | Payer: MEDICARE

## 2018-09-12 VITALS — BP 124/60 | HR 80 | Temp 98.0°F | Resp 16 | Wt 250.0 lb

## 2018-09-12 DIAGNOSIS — Z95810 Presence of automatic (implantable) cardiac defibrillator: Secondary | ICD-10-CM

## 2018-09-12 DIAGNOSIS — I6522 Occlusion and stenosis of left carotid artery: Principal | ICD-10-CM

## 2018-09-12 DIAGNOSIS — I6523 Occlusion and stenosis of bilateral carotid arteries: Principal | ICD-10-CM

## 2018-09-12 DIAGNOSIS — I5022 Chronic systolic (congestive) heart failure: Principal | ICD-10-CM

## 2018-09-12 DIAGNOSIS — I10 Essential (primary) hypertension: Secondary | ICD-10-CM

## 2018-09-12 DIAGNOSIS — Z9889 Other specified postprocedural states: Secondary | ICD-10-CM

## 2018-09-12 DIAGNOSIS — J069 Acute upper respiratory infection, unspecified: Secondary | ICD-10-CM

## 2018-09-12 DIAGNOSIS — I6521 Occlusion and stenosis of right carotid artery: Secondary | ICD-10-CM | POA: Diagnosis not present

## 2018-09-12 MED ORDER — CLOPIDOGREL 75 MG TABLET
ORAL_TABLET | Freq: Every day | ORAL | 3 refills | 0 days | Status: CP
Start: 2018-09-12 — End: 2019-09-12

## 2018-09-12 MED ORDER — CARVEDILOL 6.25 MG TABLET: 6 mg | tablet | Freq: Two times a day (BID) | 0 refills | 0 days | Status: AC

## 2018-09-12 MED ORDER — CARVEDILOL 6.25 MG TABLET
ORAL_TABLET | Freq: Two times a day (BID) | ORAL | 0 refills | 0.00000 days | Status: CP
Start: 2018-09-12 — End: 2019-04-24

## 2018-09-12 MED ORDER — BENZONATATE 200 MG PO CAPS: 200.0000 mg | ORAL_CAPSULE | Freq: Two times a day (BID) | ORAL | 0 refills | Status: DC | PRN

## 2018-09-12 MED ORDER — FLUTICASONE PROPIONATE 50 MCG/ACT NA SUSP: 2.0000 | Freq: Every day | NASAL | 6 refills | Status: DC

## 2018-09-12 NOTE — Progress Notes (Signed)
Patient: Anna Marsh Female    DOB: 10-31-1945   72 y.o.   MRN: [id number] Visit Date: 09/12/2018  Today's Provider: Vernie Murders, PA   Chief Complaint  Patient presents with  . Cough   Subjective:     Cough  This is a new problem. The current episode started in the past 7 days. The problem has been unchanged. The cough is productive of sputum. Associated symptoms include nasal congestion and rhinorrhea. Pertinent negatives include no chest pain, chills, ear congestion, ear pain, fever, headaches, heartburn, hemoptysis, myalgias, postnasal drip, rash, sore throat, shortness of breath, sweats, weight loss or wheezing. The symptoms are aggravated by lying down. Treatments tried: Coricidin  The treatment provided mild relief. Her past medical history is significant for bronchitis and pneumonia.   Patient has had a cough and congestion for 1 1/2 weeks. Patient states cough is productive. Patient also has symptoms of nasal congestion, runny nose, and chest congestion. Patient has been taking otc Coricidin with mild relief.   Past Medical History:  Diagnosis Date  . AICD (automatic cardioverter/defibrillator) present   . Anxiety   . AV block, complete (HCC)   . Cervical radiculopathy   . CHF (congestive heart failure) ([place])   . Chronic systolic heart failure ([place])   . GERD (gastroesophageal reflux disease)   . History of cardiac arrest   . Hypercholesteremia   . Hypertension   . Pre-diabetes   . 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 Right arm weakness    Past Surgical History:  Procedure Laterality Date  . ABDOMINAL HYSTERECTOMY    . CATARACT EXTRACTION W/PHACO Left 12/18/2016   Procedure: CATARACT EXTRACTION PHACO AND INTRAOCULAR LENS PLACEMENT ([place])  left;  Surgeon: Ronnell Freshwater, MD;  Location: [place];  Service: Ophthalmology;  Laterality: Left;  . CATARACT EXTRACTION W/PHACO Right 01/01/2017   Procedure:  CATARACT EXTRACTION PHACO AND INTRAOCULAR LENS PLACEMENT ([place])  Right;  Surgeon: Ronnell Freshwater, MD;  Location: [facility];  Service: Ophthalmology;  Laterality: Right;  . HERNIA REPAIR     umbilical x 2  . NECK SURGERY    . PACEMAKER IMPLANT  02/03/2015   [facility]  . TONSILLECTOMY     Family History  Problem Relation Age of Onset  . Cancer Mother   . Hyperlipidemia Father   . Heart disease Father   . Vascular Disease Father   . Arthritis Sister   . Colon cancer Sister   . Migraines Brother   . Alcohol abuse Sister   . Dementia Sister   . Bipolar disorder Sister   . Heart attack Sister        around age 51  . Arthritis Sister   . Migraines Sister   . Breast cancer Neg Hx    Allergies  Allergen Reactions  . Iodinated Diagnostic Agents Other (See Comments)    Causes migraines  . Iodine     Contrast Dye causes migraines  . Lisinopril Cough  . Penicillins Rash    Has patient had a PCN reaction causing immediate rash, facial/tongue/throat swelling, SOB or lightheadedness with hypotension: Yes Has patient had a PCN reaction causing severe rash involving mucus membranes or skin necrosis: No Has patient had a PCN reaction that required hospitalization: No Has patient had a PCN reaction occurring within the last 10 years: Yes If all of the above answers are "NO", then may proceed with Cephalosporin use.  Current Outpatient Medications:  .  atorvastatin (LIPITOR) 80 MG tablet, TAKE 1 TABLET BY MOUTH  DAILY, Disp: 90 tablet, Rfl: 3 .  butalbital-acetaminophen-caffeine (FIORICET, ESGIC) 50-325-40 MG tablet, Take 1 tablet by mouth every 4 (four) hours as needed for headache., Disp: 20 tablet, Rfl: 0 .  carvedilol (COREG) 6.25 MG tablet, Take by mouth., Disp: , Rfl:  .  cyanocobalamin 1000 MCG tablet, Take 1,000 mcg by mouth daily. , Disp: , Rfl:  .  furosemide (LASIX) 20 MG tablet, Take 1 tablet (20 mg total) by mouth daily as needed., Disp: 30 tablet, Rfl: 11 .   gabapentin (NEURONTIN) 100 MG capsule, Take 100 mg by mouth at bedtime., Disp: , Rfl:  .  loratadine (CLARITIN) 10 MG tablet, Take 10 mg by mouth daily. , Disp: , Rfl:  .  Multiple Vitamins-Minerals (MULTIVITAMIN WITH MINERALS) tablet, Take 1 tablet by mouth daily., Disp: , Rfl:  .  omeprazole (PRILOSEC) 20 MG capsule, TAKE 1 CAPSULE BY MOUTH  DAILY (Patient taking differently: Take 20 mg by mouth daily. ), Disp: 90 capsule, Rfl: 3 .  potassium chloride (K-DUR,KLOR-CON) 10 MEQ tablet, TAKE 1 TABLET BY MOUTH TWO  TIMES DAILY (Patient taking differently: Take 10 mEq by mouth 2 (two) times daily. ), Disp: 180 tablet, Rfl: 3 .  sacubitril-valsartan (ENTRESTO) 24-26 MG, Take 1 tablet by mouth 2 (two) times daily., Disp: , Rfl:  .  sertraline (ZOLOFT) 50 MG tablet, TAKE 1 TABLET BY MOUTH  DAILY, Disp: 90 tablet, Rfl: 3 .  nitroGLYCERIN (NITROSTAT) 0.4 MG SL tablet, Place 1 tablet (0.4 mg total) under the tongue every 5 (five) minutes as needed for chest pain., Disp: 30 tablet, Rfl: 3  Review of Systems  Constitutional: Negative for appetite change, chills, fatigue, fever and weight loss.  HENT: Positive for congestion and rhinorrhea. Negative for ear pain, postnasal drip, sinus pressure, sinus pain and sore throat.   Respiratory: Positive for cough. Negative for hemoptysis, chest tightness, shortness of breath and wheezing.   Cardiovascular: Negative for chest pain and palpitations.  Gastrointestinal: Negative for abdominal pain, heartburn, nausea and vomiting.  Musculoskeletal: Negative for myalgias.  Skin: Negative for rash.  Neurological: Negative for dizziness, weakness and headaches.   Social History   Tobacco Use  . Smoking status: Former Smoker    Packs/day: 0.50    Years: 20.00    Pack years: 10.00    Last attempt to quit: 10/03/2003    Years since quitting: 14.9  . Smokeless tobacco: Never Used  Substance Use Topics  . Alcohol use: No     Objective:   BP 124/60 (BP Location: Right  Arm, Patient Position: Sitting, Cuff Size: Large)   Pulse 80   Temp 98 F (36.7 C) (Oral)   Resp 16   Wt 250 lb (113.4 kg)   SpO2 97%   BMI 45.73 kg/m  Vitals:   09/12/18 1511  BP: 124/60  Pulse: 80  Resp: 16  Temp: 98 F (36.7 C)  TempSrc: Oral  SpO2: 97%  Weight: 250 lb (113.4 kg)   Physical Exam Constitutional:      General: She is not in acute distress.    Appearance: She is well-developed.  HENT:     Head: Normocephalic and atraumatic.     Right Ear: Hearing, tympanic membrane and ear canal normal.     Left Ear: Hearing, tympanic membrane and ear canal normal.     Nose: Nose normal. No rhinorrhea.  Eyes:  General: Lids are normal. No scleral icterus.       Right eye: No discharge.        Left eye: No discharge.     Extraocular Movements: Extraocular movements intact.     Conjunctiva/sclera: Conjunctivae normal.  Cardiovascular:     Rate and Rhythm: Normal rate and regular rhythm.     Heart sounds: Normal heart sounds.  Pulmonary:     Effort: Pulmonary effort is normal. No respiratory distress.     Breath sounds: Normal breath sounds.  Abdominal:     General: Bowel sounds are normal.     Palpations: Abdomen is soft.  Musculoskeletal: Normal range of motion.  Skin:    Findings: No lesion or rash.  Neurological:     Mental Status: She is alert and oriented to person, place, and time.  Psychiatric:        Speech: Speech normal.        Behavior: Behavior normal.        Thought Content: Thought content normal.       Assessment & Plan    1. URI with cough and congestion Onset over the past 10 days. No fever. Had some sore throat with ticklish cough at the onset. Some clear rhinorrhea and sputum production now. Will treat with Allegra, Flonase and Tessalon Perles for cough and congestion. Recheck if no better in 5-7 days. - fluticasone (FLONASE) 50 MCG/ACT nasal spray; Place 2 sprays into both nostrils daily.  Dispense: 16 g; Refill: 6 - benzonatate  (TESSALON) 200 MG capsule; Take 1 capsule (200 mg total) by mouth 2 (two) times daily as needed for cough.  Dispense: 20 capsule; Refill: [place], PA  [facility]

## 2018-10-16 ENCOUNTER — Ambulatory Visit (INDEPENDENT_AMBULATORY_CARE_PROVIDER_SITE_OTHER): Payer: Medicare Other | Admitting: Family Medicine

## 2018-10-16 ENCOUNTER — Encounter: Payer: Self-pay | Admitting: Family Medicine

## 2018-10-16 VITALS — BP 118/66 | HR 74 | Temp 97.7°F | Wt 254.8 lb

## 2018-10-16 DIAGNOSIS — E7800 Pure hypercholesterolemia, unspecified: Secondary | ICD-10-CM | POA: Diagnosis not present

## 2018-10-16 DIAGNOSIS — G459 Transient cerebral ischemic attack, unspecified: Secondary | ICD-10-CM

## 2018-10-16 DIAGNOSIS — Z6841 Body Mass Index (BMI) 40.0 and over, adult: Secondary | ICD-10-CM

## 2018-10-16 DIAGNOSIS — R7303 Prediabetes: Secondary | ICD-10-CM | POA: Diagnosis not present

## 2018-10-16 DIAGNOSIS — Z95810 Presence of automatic (implantable) cardiac defibrillator: Secondary | ICD-10-CM

## 2018-10-16 DIAGNOSIS — I10 Essential (primary) hypertension: Secondary | ICD-10-CM | POA: Diagnosis not present

## 2018-10-16 DIAGNOSIS — I442 Atrioventricular block, complete: Secondary | ICD-10-CM | POA: Diagnosis not present

## 2018-10-16 NOTE — Progress Notes (Addendum)
Patient: Anna Marsh Female    DOB: July 03, 1946   73 y.o.   MRN: [id number] Visit Date: 10/16/2018  Today's Provider: Wilhemena Durie, MD   Chief Complaint  Patient presents with  . Follow-up   Subjective:    HPI Patient presents today for a follow-up for a recent stoke in October,2019. Patient was hospitalized in [facility] 07/13/2018-07/23/2018. Patient states she has improved a lot and back driving along with normal activities of daily living.   Allergies  Allergen Reactions  . Iodinated Diagnostic Agents Other (See Comments)    Causes migraines  . Iodine     Contrast Dye causes migraines  . Lisinopril Cough  . Penicillins Rash    Has patient had a PCN reaction causing immediate rash, facial/tongue/throat swelling, SOB or lightheadedness with hypotension: Yes Has patient had a PCN reaction causing severe rash involving mucus membranes or skin necrosis: No Has patient had a PCN reaction that required hospitalization: No Has patient had a PCN reaction occurring within the last 10 years: Yes If all of the above answers are "NO", then may proceed with Cephalosporin use.     Current Outpatient Medications:  .  atorvastatin (LIPITOR) 80 MG tablet, TAKE 1 TABLET BY MOUTH  DAILY, Disp: 90 tablet, Rfl: 3 .  butalbital-acetaminophen-caffeine (FIORICET, ESGIC) 50-325-40 MG tablet, Take 1 tablet by mouth every 4 (four) hours as needed for headache., Disp: 20 tablet, Rfl: 0 .  cyanocobalamin 1000 MCG tablet, Take 1,000 mcg by mouth daily. , Disp: , Rfl:  .  fluticasone (FLONASE) 50 MCG/ACT nasal spray, Place 2 sprays into both nostrils daily., Disp: 16 g, Rfl: 6 .  furosemide (LASIX) 20 MG tablet, Take 1 tablet (20 mg total) by mouth daily as needed., Disp: 30 tablet, Rfl: 11 .  gabapentin (NEURONTIN) 100 MG capsule, Take 100 mg by mouth at bedtime., Disp: , Rfl:  .  loratadine (CLARITIN) 10 MG tablet, Take 10 mg by mouth daily. , Disp: , Rfl:  .  Multiple Vitamins-Minerals  (MULTIVITAMIN WITH MINERALS) tablet, Take 1 tablet by mouth daily., Disp: , Rfl:  .  omeprazole (PRILOSEC) 20 MG capsule, TAKE 1 CAPSULE BY MOUTH  DAILY (Patient taking differently: Take 20 mg by mouth daily. ), Disp: 90 capsule, Rfl: 3 .  potassium chloride (K-DUR,KLOR-CON) 10 MEQ tablet, TAKE 1 TABLET BY MOUTH TWO  TIMES DAILY (Patient taking differently: Take 10 mEq by mouth 2 (two) times daily. ), Disp: 180 tablet, Rfl: 3 .  sacubitril-valsartan (ENTRESTO) 24-26 MG, Take 1 tablet by mouth 2 (two) times daily., Disp: , Rfl:  .  sertraline (ZOLOFT) 50 MG tablet, TAKE 1 TABLET BY MOUTH  DAILY, Disp: 90 tablet, Rfl: 3 .  benzonatate (TESSALON) 200 MG capsule, Take 1 capsule (200 mg total) by mouth 2 (two) times daily as needed for cough. (Patient not taking: Reported on 10/16/2018), Disp: 20 capsule, Rfl: 0 .  carvedilol (COREG) 6.25 MG tablet, Take by mouth., Disp: , Rfl:  .  nitroGLYCERIN (NITROSTAT) 0.4 MG SL tablet, Place 1 tablet (0.4 mg total) under the tongue every 5 (five) minutes as needed for chest pain., Disp: 30 tablet, Rfl: 3  Review of Systems  Constitutional: Negative.   HENT: Negative.   Eyes: Negative.   Cardiovascular: Negative.   Endocrine: Negative.   Genitourinary: Negative.   Allergic/Immunologic: Negative.   Neurological: Negative.   Psychiatric/Behavioral: Negative.     Social History   Tobacco Use  . Smoking status: Former  Smoker    Packs/day: 0.50    Years: 20.00    Pack years: 10.00    Last attempt to quit: 10/03/2003    Years since quitting: 15.0  . Smokeless tobacco: Never Used  Substance Use Topics  . Alcohol use: No      Objective:   BP 118/66 (BP Location: Right Arm, Patient Position: Sitting, Cuff Size: Large)   Pulse 74   Temp 97.7 F (36.5 C) (Oral)   Wt 254 lb 12.8 oz (115.6 kg)   SpO2 97%   BMI 46.60 kg/m  Vitals:   10/16/18 0952  BP: 118/66  Pulse: 74  Temp: 97.7 F (36.5 C)  TempSrc: Oral  SpO2: 97%  Weight: 254 lb 12.8 oz  (115.6 kg)     Physical Exam Constitutional:      Appearance: Normal appearance. She is well-developed. She is obese.  HENT:     Head: Normocephalic and atraumatic.     Right Ear: External ear normal.     Left Ear: External ear normal.     Nose: Nose normal.  Eyes:     General: No scleral icterus.    Conjunctiva/sclera: Conjunctivae normal.  Neck:     Thyroid: No thyromegaly.  Cardiovascular:     Rate and Rhythm: Normal rate and regular rhythm.     Heart sounds: Normal heart sounds.  Pulmonary:     Effort: Pulmonary effort is normal.     Breath sounds: Normal breath sounds.  Abdominal:     Palpations: Abdomen is soft.  Skin:    General: Skin is warm and dry.  Neurological:     General: No focal deficit present.     Mental Status: She is alert and oriented to person, place, and time. Mental status is at baseline.  Psychiatric:        Behavior: Behavior normal.        Thought Content: Thought content normal.        Judgment: Judgment normal.         Assessment & Plan     1. TIA (transient ischemic attack) All risk factors treated.. She is on Plavix.  Due to this will stop omeprazole and add pantoprazole for her GERD 2. 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  3. Essential hypertension   4. Pre-diabetes   5. Hypercholesterolemia   6. Cardiac defibrillator in place   7. Class 3 severe obesity due to excess calories with serious comorbidity and body mass index (BMI) of 45.0 to 49.9 in adult [facility]) Dietary changes discussed.   I, Porsha McClurkin, CMA, am acting as a scribe for Wilhemena Durie, MD.     I have done the exam and reviewed the above chart and it is accurate to the best of my knowledge. Development worker, community has been used in this note in any air is in the dictation or transcription are unintentional.  Wilhemena Durie, MD  [place]

## 2018-10-16 NOTE — Patient Instructions (Addendum)
Stop the omeprazole and start the Pantoprazole.

## 2018-10-31 ENCOUNTER — Encounter
Admit: 2018-10-31 | Discharge: 2018-11-01 | Payer: MEDICARE | Attending: Student in an Organized Health Care Education/Training Program | Primary: Student in an Organized Health Care Education/Training Program

## 2018-10-31 DIAGNOSIS — I639 Cerebral infarction, unspecified: Principal | ICD-10-CM

## 2018-10-31 DIAGNOSIS — I6521 Occlusion and stenosis of right carotid artery: Secondary | ICD-10-CM

## 2018-11-05 ENCOUNTER — Encounter: Admit: 2018-11-05 | Discharge: 2018-11-06 | Payer: MEDICARE

## 2018-11-05 DIAGNOSIS — Z4502 Encounter for adjustment and management of automatic implantable cardiac defibrillator: Principal | ICD-10-CM

## 2018-11-05 DIAGNOSIS — I5022 Chronic systolic (congestive) heart failure: Secondary | ICD-10-CM

## 2018-11-20 ENCOUNTER — Other Ambulatory Visit: Payer: Self-pay | Admitting: *Deleted

## 2018-11-20 NOTE — Telephone Encounter (Signed)
Patient called office requesting refill for furosemide 20 mg qd prn. [facility].

## 2018-11-21 MED ORDER — FUROSEMIDE 20 MG PO TABS: 20.0000 mg | ORAL_TABLET | Freq: Every day | ORAL | 11 refills | Status: DC | PRN

## 2018-11-21 NOTE — Telephone Encounter (Signed)
Please review. Thanks!  

## 2018-11-28 ENCOUNTER — Other Ambulatory Visit: Payer: Self-pay | Admitting: Family Medicine

## 2018-11-28 DIAGNOSIS — Z1231 Encounter for screening mammogram for malignant neoplasm of breast: Secondary | ICD-10-CM

## 2018-12-08 DIAGNOSIS — Z13820 Encounter for screening for osteoporosis: Secondary | ICD-10-CM | POA: Diagnosis not present

## 2018-12-08 LAB — HM DEXA SCAN

## 2018-12-09 ENCOUNTER — Telehealth: Payer: Self-pay

## 2018-12-09 ENCOUNTER — Telehealth: Payer: Self-pay | Admitting: Family Medicine

## 2018-12-09 NOTE — Telephone Encounter (Signed)
Patient called stating that her BP has been running high for the last 2 days. She reports that BP has averaged in the 190s/70s. She denies any shortness of breath, chest pain, headaches, dizziness, or lightheadedness.   Because of her history, she is concerned that it is running so high. She is asymptomatic, no other issues. Reports that she worked in the yard yesterday and did well with no issues.   Patient wanted to know does she need to adjust her medications? She has an appt with cardiology next week. Please advise. Thanks!

## 2018-12-09 NOTE — Telephone Encounter (Signed)
Advised patient as below.  

## 2018-12-09 NOTE — Telephone Encounter (Signed)
Increase dose to 12.5 BID

## 2018-12-09 NOTE — Telephone Encounter (Signed)
It looks like her carvedilol dose is 6.25 mg daily.  Will probably take that to twice a day which is most appropriate anyway.  Please check with her on what dose she is taking

## 2018-12-09 NOTE — Telephone Encounter (Signed)
Per patient carvedilol was increased to twice a day in December by cardiologist.

## 2018-12-10 ENCOUNTER — Telehealth: Payer: Self-pay | Admitting: Family Medicine

## 2018-12-10 NOTE — Telephone Encounter (Signed)
°  Opened in error - [facility] °

## 2018-12-11 ENCOUNTER — Telehealth: Payer: Self-pay | Admitting: Family Medicine

## 2018-12-11 NOTE — Telephone Encounter (Signed)
Advised patient as below.  

## 2018-12-11 NOTE — Telephone Encounter (Signed)
Stay with lower dose.

## 2018-12-11 NOTE — Telephone Encounter (Signed)
Pt called to let us know her blood pressure is 143/80.  She was told to call and report this today.  Anna Marsh

## 2018-12-11 NOTE — Telephone Encounter (Signed)
Spoke with patient and she reports that after doubling carvediol (to 12.5mg ) she felt terrible. She reports that her BP dropped to 119/70 from 190/80s. Patient reports that she only took 1 (6.25mg ) last night, and 1 tablet this morning. She wanted to know if she can just take 1 tablet daily instead of twice a day? Please advise.

## 2018-12-13 ENCOUNTER — Telehealth: Payer: Self-pay

## 2018-12-13 NOTE — Telephone Encounter (Signed)
Patient called stating she was told to call you back with her BP readings.  Today she states it was 116/71.  She reports that this is a little better.

## 2018-12-16 ENCOUNTER — Telehealth: Payer: Self-pay | Admitting: Family Medicine

## 2018-12-16 NOTE — Telephone Encounter (Signed)
yes

## 2018-12-16 NOTE — Telephone Encounter (Signed)
Pt called asking if she should be taking Asprin with Clopidogrel  75 mg.  This is a drug that [facility] has her taking.  I didn't see it listed in her medications  CB#  [phone]  Thank s Con [place]

## 2018-12-16 NOTE — Telephone Encounter (Signed)
Please review. Thanks!  

## 2018-12-16 NOTE — Telephone Encounter (Signed)
Spoke with patient and she reports that her BP has been stable. Continue current dose.

## 2018-12-17 NOTE — Telephone Encounter (Signed)
L/M advising below.  

## 2018-12-23 ENCOUNTER — Telehealth: Payer: Self-pay

## 2018-12-23 MED ORDER — SACUBITRIL-VALSARTAN 24-26 MG PO TABS: 1.0000 | ORAL_TABLET | Freq: Two times a day (BID) | ORAL | 2 refills | Status: AC

## 2018-12-23 NOTE — Telephone Encounter (Signed)
Please review

## 2018-12-23 NOTE — Telephone Encounter (Signed)
Please review. Thanks!  

## 2018-12-23 NOTE — Telephone Encounter (Signed)
Patient called office wanting to know if Dr. Rosanna Marsh can refill Anna Marsh for her? Patient states that she use to see Dr. Ubaldo Marsh who originally prescribed it to her but is wanting Dr. Rosanna Marsh to send in prescription to [facility], patient is wanting Anna Marsh to give her a call back once prescription has been sent. KW

## 2018-12-23 NOTE — Telephone Encounter (Signed)
Ok to refill 

## 2018-12-23 NOTE — Telephone Encounter (Signed)
Patient advised. Medication was sent into the pharmacy.

## 2019-01-06 ENCOUNTER — Encounter: Payer: Self-pay | Admitting: *Deleted

## 2019-01-06 ENCOUNTER — Ambulatory Visit: Payer: Self-pay | Admitting: Physician Assistant

## 2019-01-06 ENCOUNTER — Telehealth: Payer: Self-pay | Admitting: *Deleted

## 2019-01-06 NOTE — Telephone Encounter (Signed)
Per Dr. Rosanna Randy called patient with her bone density results. Showing mild osteopenia. Patient was advised and expressed understanding.

## 2019-01-06 NOTE — Progress Notes (Deleted)
Patient: Anna Marsh Female    DOB: [birth year]   73 y.o.   MRN: [id number] Visit Date: 01/06/2019  Today's Provider: Mar Daring, PA-C   No chief complaint on file.  Subjective:     HPI  Allergies  Allergen Reactions  . Iodinated Diagnostic Agents Other (See Comments)    Causes migraines  . Iodine     Contrast Dye causes migraines  . Lisinopril Cough  . Penicillins Rash    Has patient had a PCN reaction causing immediate rash, facial/tongue/throat swelling, SOB or lightheadedness with hypotension: Yes Has patient had a PCN reaction causing severe rash involving mucus membranes or skin necrosis: No Has patient had a PCN reaction that required hospitalization: No Has patient had a PCN reaction occurring within the last 10 years: Yes If all of the above answers are "NO", then may proceed with Cephalosporin use.     Current Outpatient Medications:  .  atorvastatin (LIPITOR) 80 MG tablet, TAKE 1 TABLET BY MOUTH  DAILY, Disp: 90 tablet, Rfl: 3 .  benzonatate (TESSALON) 200 MG capsule, Take 1 capsule (200 mg total) by mouth 2 (two) times daily as needed for cough. (Patient not taking: Reported on 10/16/2018), Disp: 20 capsule, Rfl: 0 .  butalbital-acetaminophen-caffeine (FIORICET, ESGIC) 50-325-40 MG tablet, Take 1 tablet by mouth every 4 (four) hours as needed for headache., Disp: 20 tablet, Rfl: 0 .  carvedilol (COREG) 6.25 MG tablet, Take by mouth., Disp: , Rfl:  .  cyanocobalamin 1000 MCG tablet, Take 1,000 mcg by mouth daily. , Disp: , Rfl:  .  fluticasone (FLONASE) 50 MCG/ACT nasal spray, Place 2 sprays into both nostrils daily., Disp: 16 g, Rfl: 6 .  furosemide (LASIX) 20 MG tablet, Take 1 tablet (20 mg total) by mouth daily as needed., Disp: 30 tablet, Rfl: 11 .  gabapentin (NEURONTIN) 100 MG capsule, Take 100 mg by mouth at bedtime., Disp: , Rfl:  .  loratadine (CLARITIN) 10 MG tablet, Take 10 mg by mouth daily. , Disp: , Rfl:  .  Multiple  Vitamins-Minerals (MULTIVITAMIN WITH MINERALS) tablet, Take 1 tablet by mouth daily., Disp: , Rfl:  .  nitroGLYCERIN (NITROSTAT) 0.4 MG SL tablet, Place 1 tablet (0.4 mg total) under the tongue every 5 (five) minutes as needed for chest pain., Disp: 30 tablet, Rfl: 3 .  pantoprazole (PROTONIX) 40 MG tablet, Take 40 mg by mouth daily., Disp: , Rfl:  .  potassium chloride (K-DUR,KLOR-CON) 10 MEQ tablet, TAKE 1 TABLET BY MOUTH TWO  TIMES DAILY (Patient taking differently: Take 10 mEq by mouth 2 (two) times daily. ), Disp: 180 tablet, Rfl: 3 .  sacubitril-valsartan (ENTRESTO) 24-26 MG, Take 1 tablet by mouth 2 (two) times daily., Disp: 180 tablet, Rfl: 2 .  sertraline (ZOLOFT) 50 MG tablet, TAKE 1 TABLET BY MOUTH  DAILY, Disp: 90 tablet, Rfl: 3  Review of Systems  Social History   Tobacco Use  . Smoking status: Former Smoker    Packs/day: 0.50    Years: 20.00    Pack years: 10.00    Last attempt to quit: 10/03/2003    Years since quitting: 15.2  . Smokeless tobacco: Never Used  Substance Use Topics  . Alcohol use: No      Objective:   There were no vitals taken for this visit. There were no vitals filed for this visit.   Physical Exam      Assessment & Plan  Mar Daring, PA-C  [facility]

## 2019-01-22 ENCOUNTER — Ambulatory Visit: Payer: Self-pay | Admitting: Family Medicine

## 2019-02-04 ENCOUNTER — Institutional Professional Consult (permissible substitution): Admit: 2019-02-04 | Discharge: 2019-02-05 | Payer: MEDICARE

## 2019-02-04 DIAGNOSIS — I5022 Chronic systolic (congestive) heart failure: Secondary | ICD-10-CM

## 2019-02-04 DIAGNOSIS — Z4502 Encounter for adjustment and management of automatic implantable cardiac defibrillator: Principal | ICD-10-CM

## 2019-02-11 ENCOUNTER — Other Ambulatory Visit: Payer: Self-pay

## 2019-02-11 MED ORDER — BUTALBITAL-APAP-CAFFEINE 50-325-40 MG PO TABS: 1.0000 | ORAL_TABLET | ORAL | 0 refills | Status: DC | PRN

## 2019-02-11 NOTE — Telephone Encounter (Signed)
Patient called and stated she has been having headaches since last Monday 02/03/2019 off and on. Patient is requesting that something be called in for her headaches if possible. Patient states she took her migraine medication but believes that it may be old. L.O.V. 10/16/2018. Please advise.

## 2019-03-04 ENCOUNTER — Other Ambulatory Visit: Payer: Self-pay

## 2019-03-04 ENCOUNTER — Ambulatory Visit (INDEPENDENT_AMBULATORY_CARE_PROVIDER_SITE_OTHER): Payer: Medicare Other | Admitting: Family Medicine

## 2019-03-04 ENCOUNTER — Encounter: Payer: Self-pay | Admitting: Family Medicine

## 2019-03-04 VITALS — BP 126/84 | HR 76 | Temp 97.4°F | Resp 18 | Wt 258.4 lb

## 2019-03-04 DIAGNOSIS — G43809 Other migraine, not intractable, without status migrainosus: Secondary | ICD-10-CM | POA: Diagnosis not present

## 2019-03-04 DIAGNOSIS — Z6841 Body Mass Index (BMI) 40.0 and over, adult: Secondary | ICD-10-CM

## 2019-03-04 DIAGNOSIS — I10 Essential (primary) hypertension: Secondary | ICD-10-CM | POA: Diagnosis not present

## 2019-03-04 DIAGNOSIS — I5022 Chronic systolic (congestive) heart failure: Secondary | ICD-10-CM

## 2019-03-04 DIAGNOSIS — I442 Atrioventricular block, complete: Secondary | ICD-10-CM | POA: Diagnosis not present

## 2019-03-04 DIAGNOSIS — I63233 Cerebral infarction due to unspecified occlusion or stenosis of bilateral carotid arteries: Secondary | ICD-10-CM

## 2019-03-04 MED ORDER — BUTALBITAL-APAP-CAFFEINE 50-325-40 MG PO TABS: 1.0000 | ORAL_TABLET | Freq: Four times a day (QID) | ORAL | 2 refills | Status: DC | PRN

## 2019-03-04 NOTE — Progress Notes (Signed)
Patient: Anna Marsh Female    DOB: [birth year]   73 y.o.   MRN: [id number] Visit Date: 03/04/2019  Today's Provider: Wilhemena Durie, MD   Chief Complaint  Patient presents with   Headache   Follow-up   Subjective:      HPI  3 Month follow up. Patient states that she has been having some headaches that are coming on at about 2:30 AM for about three days about a month ago. Medication refill on Fioricet. And Nitrostat. She has a history of migraines remotely.  3 times in the last few nights she is awakened with a headache in the middle of the night.  It was a dull ache in no particular area of her head.  No neurologic symptoms.  Neurologically she is recovered nicely from her previous CVA.  Today she feels well.  She certainly has no focal issues that she complains of.  Allergies  Allergen Reactions   Iodinated Diagnostic Agents Other (See Comments)    Causes migraines   Iodine     Contrast Dye causes migraines   Lisinopril Cough   Penicillins Rash    Has patient had a PCN reaction causing immediate rash, facial/tongue/throat swelling, SOB or lightheadedness with hypotension: Yes Has patient had a PCN reaction causing severe rash involving mucus membranes or skin necrosis: No Has patient had a PCN reaction that required hospitalization: No Has patient had a PCN reaction occurring within the last 10 years: Yes If all of the above answers are "NO", then may proceed with Cephalosporin use.     Current Outpatient Medications:    atorvastatin (LIPITOR) 80 MG tablet, TAKE 1 TABLET BY MOUTH  DAILY, Disp: 90 tablet, Rfl: 3   butalbital-acetaminophen-caffeine (FIORICET) 50-325-40 MG tablet, Take 1 tablet by mouth every 4 (four) hours as needed for headache., Disp: 20 tablet, Rfl: 0   cyanocobalamin 1000 MCG tablet, Take 1,000 mcg by mouth daily. , Disp: , Rfl:    fluticasone (FLONASE) 50 MCG/ACT nasal spray, Place 2 sprays into both nostrils daily., Disp: 16 g,  Rfl: 6   furosemide (LASIX) 20 MG tablet, Take 1 tablet (20 mg total) by mouth daily as needed., Disp: 30 tablet, Rfl: 11   gabapentin (NEURONTIN) 100 MG capsule, Take 100 mg by mouth at bedtime., Disp: , Rfl:    loratadine (CLARITIN) 10 MG tablet, Take 10 mg by mouth daily. , Disp: , Rfl:    Multiple Vitamins-Minerals (MULTIVITAMIN WITH MINERALS) tablet, Take 1 tablet by mouth daily., Disp: , Rfl:    pantoprazole (PROTONIX) 40 MG tablet, Take 40 mg by mouth daily., Disp: , Rfl:    potassium chloride (K-DUR,KLOR-CON) 10 MEQ tablet, TAKE 1 TABLET BY MOUTH TWO  TIMES DAILY (Patient taking differently: Take 10 mEq by mouth 2 (two) times daily. ), Disp: 180 tablet, Rfl: 3   sacubitril-valsartan (ENTRESTO) 24-26 MG, Take 1 tablet by mouth 2 (two) times daily., Disp: 180 tablet, Rfl: 2   sertraline (ZOLOFT) 50 MG tablet, TAKE 1 TABLET BY MOUTH  DAILY, Disp: 90 tablet, Rfl: 3   benzonatate (TESSALON) 200 MG capsule, Take 1 capsule (200 mg total) by mouth 2 (two) times daily as needed for cough. (Patient not taking: Reported on 10/16/2018), Disp: 20 capsule, Rfl: 0   carvedilol (COREG) 6.25 MG tablet, Take by mouth., Disp: , Rfl:    nitroGLYCERIN (NITROSTAT) 0.4 MG SL tablet, Place 1 tablet (0.4 mg total) under the tongue every 5 (five) minutes as  needed for chest pain., Disp: 30 tablet, Rfl: 3  Review of Systems  Constitutional: Negative.   HENT: Negative.   Eyes: Negative.   Respiratory: Negative.   Cardiovascular: Negative.   Gastrointestinal: Negative.   Endocrine: Negative.   Genitourinary: Negative.   Allergic/Immunologic: Negative.   Neurological: Positive for headaches.  Psychiatric/Behavioral: Negative.   All other systems reviewed and are negative.   Social History   Tobacco Use   Smoking status: Former Smoker    Packs/day: 0.50    Years: 20.00    Pack years: 10.00    Last attempt to quit: 10/03/2003    Years since quitting: 15.4   Smokeless tobacco: Never Used    Substance Use Topics   Alcohol use: No      Objective:   BP 126/84 (BP Location: Left Arm, Patient Position: Sitting, Cuff Size: Normal)    Pulse 76    Temp (!) 97.4 F (36.3 C) (Oral)    Resp 18    Wt 258 lb 6.4 oz (117.2 kg)    SpO2 97%    BMI 47.26 kg/m  Vitals:   03/04/19 1532  BP: 126/84  Pulse: 76  Resp: 18  Temp: (!) 97.4 F (36.3 C)  TempSrc: Oral  SpO2: 97%  Weight: 258 lb 6.4 oz (117.2 kg)     Physical Exam Vitals signs reviewed.  Constitutional:      Appearance: She is obese.  HENT:     Head: Normocephalic and atraumatic.  Neck:     Musculoskeletal: Neck supple.  Cardiovascular:     Rate and Rhythm: Normal rate and regular rhythm.  Pulmonary:     Effort: Pulmonary effort is normal.     Breath sounds: Normal breath sounds.  Abdominal:     Palpations: Abdomen is soft.  Skin:    General: Skin is warm and dry.  Neurological:     Mental Status: She is alert and oriented to person, place, and time. Mental status is at baseline.  Psychiatric:        Mood and Affect: Mood normal.        Speech: Speech normal.        Behavior: Behavior normal.         Assessment & Plan    1. Cerebrovascular accident (CVA) due to bilateral stenosis of carotid arteries ([place]) History of CVA.  Risk factors treated. - HgB A1c  2. Other migraine without status migrainosus, not intractable H/o migraines.Treat with Fioricet. May need imaging if this persists. May need neurology referral. - Sed Rate (ESR)  3. Essential hypertension Controlled. - CBC w/Diff/Platelet - Comp. Metabolic Panel (12)  4. 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  5. Chronic systolic CHF (congestive heart failure), NYHA class 3 (HCC) Treated.  6. Class 3 severe obesity due to excess calories with serious comorbidity and body mass index (BMI) of 45.0 to 49.9 in adult [facility])      Wilhemena Durie, MD  [facility]

## 2019-03-11 ENCOUNTER — Other Ambulatory Visit: Payer: Self-pay

## 2019-03-11 ENCOUNTER — Ambulatory Visit
Admission: RE | Admit: 2019-03-11 | Discharge: 2019-03-11 | Disposition: A | Discharge status: Home / Self Care | Payer: Medicare Other | Source: Ambulatory Visit | Attending: Family Medicine | Admitting: Family Medicine

## 2019-03-11 DIAGNOSIS — I63233 Cerebral infarction due to unspecified occlusion or stenosis of bilateral carotid arteries: Secondary | ICD-10-CM | POA: Diagnosis not present

## 2019-03-11 DIAGNOSIS — G43809 Other migraine, not intractable, without status migrainosus: Secondary | ICD-10-CM | POA: Diagnosis not present

## 2019-03-11 DIAGNOSIS — I10 Essential (primary) hypertension: Secondary | ICD-10-CM | POA: Diagnosis not present

## 2019-03-11 DIAGNOSIS — Z1231 Encounter for screening mammogram for malignant neoplasm of breast: Secondary | ICD-10-CM | POA: Diagnosis not present

## 2019-03-12 LAB — COMP. METABOLIC PANEL (12)
AST: 15 IU/L (ref 0–40)
Albumin/Globulin Ratio: 1.9 (ref 1.2–2.2)
Albumin: 4.1 g/dL (ref 3.7–4.7)
Alkaline Phosphatase: 131 IU/L — ABNORMAL HIGH (ref 39–117)
BUN/Creatinine Ratio: 15 (ref 12–28)
BUN: 14 mg/dL (ref 8–27)
Bilirubin Total: 0.4 mg/dL (ref 0.0–1.2)
Calcium: 9.6 mg/dL (ref 8.7–10.3)
Chloride: 104 mmol/L (ref 96–106)
Creatinine, Ser: 0.91 mg/dL (ref 0.57–1.00)
GFR calc Af Amer: 72 mL/min/{1.73_m2} (ref >59)
GFR calc non Af Amer: 63 mL/min/{1.73_m2} (ref >59)
Globulin, Total: 2.2 g/dL (ref 1.5–4.5)
Glucose: 98 mg/dL (ref 65–99)
Potassium: 4.4 mmol/L (ref 3.5–5.2)
Sodium: 141 mmol/L (ref 134–144)
Total Protein: 6.3 g/dL (ref 6.0–8.5)

## 2019-03-12 LAB — HEMOGLOBIN A1C
Est. average glucose Bld gHb Est-mCnc: 111 mg/dL
Hgb A1c MFr Bld: 5.5 % (ref 4.8–5.6)

## 2019-03-12 LAB — CBC WITH DIFFERENTIAL/PLATELET
Basophils Absolute: 0 10*3/uL (ref 0.0–0.2)
Basos: 0 %
EOS (ABSOLUTE): 0.2 10*3/uL (ref 0.0–0.4)
Eos: 4 %
Hematocrit: 37.6 % (ref 34.0–46.6)
Hemoglobin: 12.7 g/dL (ref 11.1–15.9)
Immature Grans (Abs): 0 10*3/uL (ref 0.0–0.1)
Immature Granulocytes: 0 %
Lymphocytes Absolute: 1.1 10*3/uL (ref 0.7–3.1)
Lymphs: 22 %
MCH: 28.9 pg (ref 26.6–33.0)
MCHC: 33.8 g/dL (ref 31.5–35.7)
MCV: 86 fL (ref 79–97)
Monocytes Absolute: 0.4 10*3/uL (ref 0.1–0.9)
Monocytes: 8 %
Neutrophils Absolute: 3.4 10*3/uL (ref 1.4–7.0)
Neutrophils: 66 %
Platelets: 241 10*3/uL (ref 150–450)
RBC: 4.39 x10E6/uL (ref 3.77–5.28)
RDW: 13 % (ref 11.7–15.4)
WBC: 5.2 10*3/uL (ref 3.4–10.8)

## 2019-03-12 LAB — SEDIMENTATION RATE: Sed Rate: 57 mm/hr — ABNORMAL HIGH (ref 0–40)

## 2019-03-13 ENCOUNTER — Telehealth: Payer: Self-pay

## 2019-03-13 DIAGNOSIS — G43809 Other migraine, not intractable, without status migrainosus: Secondary | ICD-10-CM

## 2019-03-13 NOTE — Telephone Encounter (Signed)
-----   Message from Jerrol Banana., MD sent at 03/13/2019  8:12 AM EDT ----- Labs okay.  Will repeat sed rate on next office visit.  Mildly elevated.  Would like to repeat in about 2 to 4 weeks if possible.

## 2019-03-13 NOTE — Telephone Encounter (Signed)
Patient was advised and states that she will come get blood work drawn next week sometime.

## 2019-03-17 ENCOUNTER — Telehealth: Payer: Self-pay

## 2019-03-17 NOTE — Telephone Encounter (Signed)
Try Probiotic BID for now,as long as no fever or pain or blood in the stool I think she is safe to monitor this for the time being.

## 2019-03-17 NOTE — Telephone Encounter (Signed)
Advised patient as below.  

## 2019-03-17 NOTE — Telephone Encounter (Signed)
Patient reports diarrhea since last Wednesday. Patient reports very watery stools 3 times a day. Patient reports taking OTC immodium. Patient reports medication does help but once she comes off medication the diarrhea returns. Patient denies fever, nausea, vomiting or abdominal pain. Patient requesting advise.

## 2019-03-17 NOTE — Telephone Encounter (Signed)
Please review. Thanks!  

## 2019-03-19 DIAGNOSIS — G43809 Other migraine, not intractable, without status migrainosus: Secondary | ICD-10-CM | POA: Diagnosis not present

## 2019-03-20 ENCOUNTER — Telehealth: Payer: Self-pay

## 2019-03-20 LAB — SEDIMENTATION RATE: Sed Rate: 14 mm/hr (ref 0–40)

## 2019-03-20 NOTE — Telephone Encounter (Signed)
-----   Message from Jerrol Banana., MD sent at 03/20/2019  8:40 AM EDT ----- Sed rate  Back to normaL.

## 2019-03-20 NOTE — Telephone Encounter (Signed)
Patient notified of lab results

## 2019-03-25 ENCOUNTER — Telehealth: Payer: Self-pay | Admitting: Family Medicine

## 2019-03-25 NOTE — Chronic Care Management (AMB) (Signed)
Chronic Care Management   Note  03/25/2019 Name: Anna Marsh MRN: [id number] DOB: Sep 06, 1946  Anna Marsh is a 73 y.o. year old female who is a primary care patient of Jerrol Banana., MD. I reached out to Daryll Drown by phone today in response to a referral sent by Anna Marsh's health plan.    [name] was given information about Chronic Care Management services today including:  1. CCM service includes personalized support from designated clinical staff supervised by her physician, including individualized plan of care and coordination with other care providers 2. 24/7 contact phone numbers for assistance for urgent and routine care needs. 3. Service will only be billed when office clinical staff spend 20 minutes or more in a month to coordinate care. 4. Only one practitioner may furnish and bill the service in a calendar month. 5. The patient may stop CCM services at any time (effective at the end of the month) by phone call to the office staff. 6. The patient will be responsible for cost sharing (co-pay) of up to 20% of the service fee (after annual deductible is met).  Patient did not agree to enrollment in care management services and does not wish to consider at this time.  Follow up plan: The patient has been provided with contact information for the chronic care management team and has been advised to call with any health related questions or concerns.   [place]  ??[email or web address]   ??[phone]

## 2019-04-17 ENCOUNTER — Encounter: Admit: 2019-04-17 | Discharge: 2019-04-17 | Payer: MEDICARE | Attending: Specialist | Primary: Specialist

## 2019-04-17 ENCOUNTER — Ambulatory Visit: Admit: 2019-04-17 | Discharge: 2019-04-17 | Payer: MEDICARE

## 2019-04-17 DIAGNOSIS — I6523 Occlusion and stenosis of bilateral carotid arteries: Principal | ICD-10-CM

## 2019-04-17 DIAGNOSIS — I6521 Occlusion and stenosis of right carotid artery: Principal | ICD-10-CM

## 2019-04-17 DIAGNOSIS — I10 Essential (primary) hypertension: Secondary | ICD-10-CM | POA: Diagnosis not present

## 2019-04-17 DIAGNOSIS — E785 Hyperlipidemia, unspecified: Secondary | ICD-10-CM | POA: Diagnosis not present

## 2019-04-17 DIAGNOSIS — Z87891 Personal history of nicotine dependence: Secondary | ICD-10-CM | POA: Diagnosis not present

## 2019-04-17 MED ORDER — PREDNISONE 50 MG TABLET
ORAL_TABLET | ORAL | 1 refills | 0 days | Status: CP
Start: 2019-04-17 — End: ?

## 2019-04-17 MED ORDER — DIPHENHYDRAMINE 50 MG CAPSULE
ORAL_CAPSULE | ORAL | 0 refills | 0 days | Status: CP
Start: 2019-04-17 — End: ?

## 2019-04-24 ENCOUNTER — Encounter
Admit: 2019-04-24 | Discharge: 2019-04-25 | Payer: MEDICARE | Attending: Cardiovascular Disease | Primary: Cardiovascular Disease

## 2019-04-24 DIAGNOSIS — I5022 Chronic systolic (congestive) heart failure: Principal | ICD-10-CM

## 2019-04-24 MED ORDER — SACUBITRIL 49 MG-VALSARTAN 51 MG TABLET
ORAL_TABLET | Freq: Two times a day (BID) | ORAL | 3 refills | 90.00000 days | Status: CP
Start: 2019-04-24 — End: 2019-04-24

## 2019-04-24 MED ORDER — CARVEDILOL 6.25 MG TABLET
ORAL_TABLET | Freq: Two times a day (BID) | ORAL | 3 refills | 90.00000 days | Status: CP
Start: 2019-04-24 — End: 2019-05-08

## 2019-04-24 MED ORDER — SACUBITRIL 49 MG-VALSARTAN 51 MG TABLET: 1 | tablet | Freq: Two times a day (BID) | 3 refills | 90 days | Status: AC

## 2019-05-05 ENCOUNTER — Encounter: Payer: Self-pay | Admitting: Family Medicine

## 2019-05-05 ENCOUNTER — Other Ambulatory Visit: Payer: Self-pay

## 2019-05-05 ENCOUNTER — Ambulatory Visit (INDEPENDENT_AMBULATORY_CARE_PROVIDER_SITE_OTHER): Payer: Medicare Other | Admitting: Family Medicine

## 2019-05-05 VITALS — BP 84/52 | HR 65 | Temp 97.6°F | Resp 16 | Wt 259.0 lb

## 2019-05-05 DIAGNOSIS — E7800 Pure hypercholesterolemia, unspecified: Secondary | ICD-10-CM

## 2019-05-05 DIAGNOSIS — Z6841 Body Mass Index (BMI) 40.0 and over, adult: Secondary | ICD-10-CM

## 2019-05-05 DIAGNOSIS — I63233 Cerebral infarction due to unspecified occlusion or stenosis of bilateral carotid arteries: Secondary | ICD-10-CM

## 2019-05-05 DIAGNOSIS — I255 Ischemic cardiomyopathy: Secondary | ICD-10-CM | POA: Diagnosis not present

## 2019-05-05 DIAGNOSIS — I5022 Chronic systolic (congestive) heart failure: Secondary | ICD-10-CM | POA: Diagnosis not present

## 2019-05-05 DIAGNOSIS — I442 Atrioventricular block, complete: Secondary | ICD-10-CM | POA: Diagnosis not present

## 2019-05-05 NOTE — Progress Notes (Signed)
Patient: Anna Marsh Female    DOB: Jan 31, 1946   73 y.o.   MRN: [id number] Visit Date: 05/05/2019  Today's Provider: Wilhemena Durie, MD   Chief Complaint  Patient presents with  . Follow-up   Subjective:     HPI  2 Month follow up for headaches. Patient states that headaches are better.  Probiotic has also helped GI symptoms. She checks her BP at home and it runs120s-160s over 60s to 70s. Cardiology  Allergies  Allergen Reactions  . Iodinated Diagnostic Agents Other (See Comments)    Causes migraines  . Iodine     Contrast Dye causes migraines  . Lisinopril Cough  . Penicillins Rash    Has patient had a PCN reaction causing immediate rash, facial/tongue/throat swelling, SOB or lightheadedness with hypotension: Yes Has patient had a PCN reaction causing severe rash involving mucus membranes or skin necrosis: No Has patient had a PCN reaction that required hospitalization: No Has patient had a PCN reaction occurring within the last 10 years: Yes If all of the above answers are "NO", then may proceed with Cephalosporin use.     Current Outpatient Medications:  .  atorvastatin (LIPITOR) 80 MG tablet, TAKE 1 TABLET BY MOUTH  DAILY, Disp: 90 tablet, Rfl: 3 .  butalbital-acetaminophen-caffeine (FIORICET) 50-325-40 MG tablet, Take 1 tablet by mouth every 4 (four) hours as needed for headache., Disp: 20 tablet, Rfl: 0 .  butalbital-acetaminophen-caffeine (FIORICET) 50-325-40 MG tablet, Take 1-2 tablets by mouth every 6 (six) hours as needed for headache., Disp: 25 tablet, Rfl: 2 .  cyanocobalamin 1000 MCG tablet, Take 1,000 mcg by mouth daily. , Disp: , Rfl:  .  furosemide (LASIX) 20 MG tablet, Take 1 tablet (20 mg total) by mouth daily as needed., Disp: 30 tablet, Rfl: 11 .  gabapentin (NEURONTIN) 100 MG capsule, Take 100 mg by mouth at bedtime., Disp: , Rfl:  .  loratadine (CLARITIN) 10 MG tablet, Take 10 mg by mouth daily. , Disp: , Rfl:  .  Multiple  Vitamins-Minerals (MULTIVITAMIN WITH MINERALS) tablet, Take 1 tablet by mouth daily., Disp: , Rfl:  .  pantoprazole (PROTONIX) 40 MG tablet, Take 40 mg by mouth daily., Disp: , Rfl:  .  potassium chloride (K-DUR,KLOR-CON) 10 MEQ tablet, TAKE 1 TABLET BY MOUTH TWO  TIMES DAILY (Patient taking differently: Take 10 mEq by mouth 2 (two) times daily. ), Disp: 180 tablet, Rfl: 3 .  sacubitril-valsartan (ENTRESTO) 49-51 MG, Take 1 tablet by mouth 2 (two) times daily., Disp: , Rfl:  .  sertraline (ZOLOFT) 50 MG tablet, TAKE 1 TABLET BY MOUTH  DAILY, Disp: 90 tablet, Rfl: 3 .  benzonatate (TESSALON) 200 MG capsule, Take 1 capsule (200 mg total) by mouth 2 (two) times daily as needed for cough. (Patient not taking: Reported on 10/16/2018), Disp: 20 capsule, Rfl: 0 .  carvedilol (COREG) 6.25 MG tablet, Take by mouth., Disp: , Rfl:  .  fluticasone (FLONASE) 50 MCG/ACT nasal spray, Place 2 sprays into both nostrils daily. (Patient not taking: Reported on 05/05/2019), Disp: 16 g, Rfl: 6 .  nitroGLYCERIN (NITROSTAT) 0.4 MG SL tablet, Place 1 tablet (0.4 mg total) under the tongue every 5 (five) minutes as needed for chest pain., Disp: 30 tablet, Rfl: 3  Review of Systems  Eyes: Negative.   Respiratory: Negative.   Cardiovascular: Negative.   Gastrointestinal: Negative.   Endocrine: Negative.   Allergic/Immunologic: Negative.   Hematological: Negative.   Psychiatric/Behavioral: Negative.  Social History   Tobacco Use  . Smoking status: Former Smoker    Packs/day: 0.50    Years: 20.00    Pack years: 10.00    Quit date: 10/03/2003    Years since quitting: 15.5  . Smokeless tobacco: Never Used  Substance Use Topics  . Alcohol use: No      Objective:   BP (!) 84/52 (BP Location: Left Arm, Patient Position: Sitting, Cuff Size: Large)   Pulse 65   Temp 97.6 F (36.4 C) (Oral)   Resp 16   Wt 259 lb (117.5 kg)   SpO2 97%   BMI 47.37 kg/m  Vitals:   05/05/19 0922  BP: (!) 84/52  Pulse: 65   Resp: 16  Temp: 97.6 F (36.4 C)  TempSrc: Oral  SpO2: 97%  Weight: 259 lb (117.5 kg)     Physical Exam Vitals signs reviewed.  Constitutional:      Appearance: She is obese.  HENT:     Head: Normocephalic and atraumatic.  Neck:     Musculoskeletal: Neck supple.  Cardiovascular:     Rate and Rhythm: Normal rate and regular rhythm.  Pulmonary:     Effort: Pulmonary effort is normal.     Breath sounds: Normal breath sounds.  Abdominal:     Palpations: Abdomen is soft.  Skin:    General: Skin is warm and dry.  Neurological:     Mental Status: She is alert and oriented to person, place, and time. Mental status is at baseline.  Psychiatric:        Mood and Affect: Mood normal.        Speech: Speech normal.        Behavior: Behavior normal.      No results found for any visits on 05/05/19.     Assessment & Plan    1. Chronic systolic CHF (congestive heart failure), NYHA class 3 (HCC) Entresto dose just doubled but BP low today. Pt to monitor BP and bring in cuff and readings next visit. More than 50% 25 minute visit spent in counseling or coordination of care  2. Complete atrioventricular block (HCC)   3. Ischemic cardiomyopathy   4. Class 3 severe obesity due to excess calories with serious comorbidity and body mass index (BMI) of 45.0 to 49.9 in adult ([place])   5. Hypercholesterolemia  6.Recent CVA All risk factors treated.     Richard Cranford Mon, MD  [facility]

## 2019-05-06 ENCOUNTER — Encounter: Admit: 2019-05-06 | Discharge: 2019-05-07 | Payer: MEDICARE

## 2019-05-06 DIAGNOSIS — I5022 Chronic systolic (congestive) heart failure: Secondary | ICD-10-CM

## 2019-05-06 DIAGNOSIS — Z4502 Encounter for adjustment and management of automatic implantable cardiac defibrillator: Principal | ICD-10-CM

## 2019-05-12 ENCOUNTER — Encounter: Admit: 2019-05-12 | Discharge: 2019-05-12 | Payer: MEDICARE

## 2019-05-12 ENCOUNTER — Ambulatory Visit: Admit: 2019-05-12 | Discharge: 2019-05-12 | Payer: MEDICARE

## 2019-05-12 DIAGNOSIS — I5022 Chronic systolic (congestive) heart failure: Principal | ICD-10-CM

## 2019-05-12 DIAGNOSIS — I6521 Occlusion and stenosis of right carotid artery: Principal | ICD-10-CM

## 2019-05-12 DIAGNOSIS — Z9862 Peripheral vascular angioplasty status: Secondary | ICD-10-CM | POA: Diagnosis not present

## 2019-05-12 DIAGNOSIS — I6523 Occlusion and stenosis of bilateral carotid arteries: Secondary | ICD-10-CM | POA: Diagnosis not present

## 2019-05-12 DIAGNOSIS — Z9889 Other specified postprocedural states: Secondary | ICD-10-CM | POA: Diagnosis not present

## 2019-05-22 ENCOUNTER — Encounter: Admit: 2019-05-22 | Discharge: 2019-05-23 | Payer: MEDICARE | Attending: Specialist | Primary: Specialist

## 2019-05-22 DIAGNOSIS — I6523 Occlusion and stenosis of bilateral carotid arteries: Principal | ICD-10-CM

## 2019-06-20 ENCOUNTER — Encounter
Admit: 2019-06-20 | Discharge: 2019-06-21 | Payer: MEDICARE | Attending: Student in an Organized Health Care Education/Training Program | Primary: Student in an Organized Health Care Education/Training Program

## 2019-06-20 DIAGNOSIS — I63311 Cerebral infarction due to thrombosis of right middle cerebral artery: Secondary | ICD-10-CM

## 2019-06-20 DIAGNOSIS — I509 Heart failure, unspecified: Secondary | ICD-10-CM | POA: Diagnosis not present

## 2019-06-20 DIAGNOSIS — I110 Hypertensive heart disease with heart failure: Secondary | ICD-10-CM | POA: Diagnosis not present

## 2019-06-20 DIAGNOSIS — E119 Type 2 diabetes mellitus without complications: Secondary | ICD-10-CM | POA: Diagnosis not present

## 2019-06-20 DIAGNOSIS — E785 Hyperlipidemia, unspecified: Secondary | ICD-10-CM | POA: Diagnosis not present

## 2019-06-20 DIAGNOSIS — K219 Gastro-esophageal reflux disease without esophagitis: Secondary | ICD-10-CM | POA: Diagnosis not present

## 2019-06-26 ENCOUNTER — Other Ambulatory Visit: Payer: Self-pay

## 2019-06-26 ENCOUNTER — Telehealth: Payer: Self-pay | Admitting: Family Medicine

## 2019-06-26 MED ORDER — PROMETHAZINE HCL 25 MG PO TABS: ORAL_TABLET | ORAL | 1 refills | Status: AC

## 2019-06-26 NOTE — Telephone Encounter (Signed)
Please advise 

## 2019-06-26 NOTE — Telephone Encounter (Signed)
Pt had a migraine headache last night and is better this am.  [name] called saying she thought Dr. Rosanna Randy had prescribed something for her headaches before but they couldn't find it.  They want to know if he can give her something for migraine and nausea  [facility]  CB# [phone]    [place]

## 2019-06-26 NOTE — Telephone Encounter (Signed)
Phenergan 25mg --1/2-1 po q 8 hrs prn nausea==#30,1rf.

## 2019-06-26 NOTE — Telephone Encounter (Signed)
Advised patient. Medication was sent.

## 2019-06-27 ENCOUNTER — Telehealth: Payer: Self-pay

## 2019-06-27 ENCOUNTER — Other Ambulatory Visit: Payer: Self-pay

## 2019-06-27 DIAGNOSIS — G4489 Other headache syndrome: Secondary | ICD-10-CM

## 2019-06-27 MED ORDER — BUTALBITAL-APAP-CAFFEINE 50-325-40 MG PO TABS: 1.0000 | ORAL_TABLET | Freq: Four times a day (QID) | ORAL | 2 refills | Status: AC | PRN

## 2019-06-27 MED ORDER — BUTALBITAL-APAP-CAFFEINE 50-325-40 MG PO TABS: 1.0000 | ORAL_TABLET | Freq: Four times a day (QID) | ORAL | 1 refills | Status: DC | PRN

## 2019-06-27 MED ORDER — BUTALBITAL-APAP-CAFFEINE 50-325-40 MG PO TABS: 1.0000 | ORAL_TABLET | ORAL | 3 refills | Status: DC | PRN

## 2019-06-27 NOTE — Telephone Encounter (Signed)
Patient's daughter called stating that the Fiorcet was never filled.

## 2019-06-27 NOTE — Telephone Encounter (Signed)
Sent again

## 2019-06-27 NOTE — Telephone Encounter (Signed)
Medication went to wrong pharmacy It needs to be sent to [facility].

## 2019-06-27 NOTE — Telephone Encounter (Signed)
done

## 2019-08-05 ENCOUNTER — Encounter: Payer: Self-pay | Admitting: Family Medicine

## 2019-08-05 ENCOUNTER — Other Ambulatory Visit: Payer: Self-pay

## 2019-08-05 ENCOUNTER — Ambulatory Visit (INDEPENDENT_AMBULATORY_CARE_PROVIDER_SITE_OTHER): Payer: Medicare Other | Admitting: Family Medicine

## 2019-08-05 ENCOUNTER — Institutional Professional Consult (permissible substitution): Admit: 2019-08-05 | Discharge: 2019-08-06 | Payer: MEDICARE

## 2019-08-05 VITALS — BP 133/69 | HR 66 | Temp 96.9°F | Resp 18 | Ht 63.0 in | Wt 263.0 lb

## 2019-08-05 DIAGNOSIS — Z4502 Encounter for adjustment and management of automatic implantable cardiac defibrillator: Principal | ICD-10-CM

## 2019-08-05 DIAGNOSIS — I5022 Chronic systolic (congestive) heart failure: Principal | ICD-10-CM

## 2019-08-05 DIAGNOSIS — I10 Essential (primary) hypertension: Secondary | ICD-10-CM

## 2019-08-05 DIAGNOSIS — Z6841 Body Mass Index (BMI) 40.0 and over, adult: Secondary | ICD-10-CM

## 2019-08-05 DIAGNOSIS — Z23 Encounter for immunization: Secondary | ICD-10-CM | POA: Diagnosis not present

## 2019-08-05 DIAGNOSIS — E7800 Pure hypercholesterolemia, unspecified: Secondary | ICD-10-CM | POA: Diagnosis not present

## 2019-08-05 DIAGNOSIS — I25119 Atherosclerotic heart disease of native coronary artery with unspecified angina pectoris: Secondary | ICD-10-CM

## 2019-08-05 MED ORDER — CARVEDILOL 6.25 MG TABLET: tablet | 3 refills | 0 days | Status: AC

## 2019-08-05 MED ORDER — CARVEDILOL 6.25 MG TABLET
ORAL_TABLET | Freq: Two times a day (BID) | ORAL | 3 refills | 0.00000 days | Status: CP
Start: 2019-08-05 — End: 2019-08-19

## 2019-08-05 NOTE — Progress Notes (Signed)
Patient: Anna Marsh Female    DOB: 09-03-1946   73 y.o.   MRN: [id number] Visit Date: 08/05/2019  Today's Provider: Wilhemena Durie, MD   Chief Complaint  Patient presents with  . Follow-up  . Congestive Heart Failure   Subjective:     HPI  Entresto dose has been cut in half. Chronic systolic CHF (congestive heart failure), NYHA class 3 (HCC) From 05/05/2019-Entresto dose just doubled but BP low today. Pt to monitor BP and bring in cuff and readings next visit.  Pt has chronic mild encopresis.  Allergies  Allergen Reactions  . Iodinated Diagnostic Agents Other (See Comments)    Causes migraines  . Iodine     Contrast Dye causes migraines  . Lisinopril Cough  . Penicillins Rash    Has patient had a PCN reaction causing immediate rash, facial/tongue/throat swelling, SOB or lightheadedness with hypotension: Yes Has patient had a PCN reaction causing severe rash involving mucus membranes or skin necrosis: No Has patient had a PCN reaction that required hospitalization: No Has patient had a PCN reaction occurring within the last 10 years: Yes If all of the above answers are "NO", then may proceed with Cephalosporin use.     Current Outpatient Medications:  .  atorvastatin (LIPITOR) 80 MG tablet, TAKE 1 TABLET BY MOUTH  DAILY, Disp: 90 tablet, Rfl: 3 .  butalbital-acetaminophen-caffeine (FIORICET) 50-325-40 MG tablet, Take 1 tablet by mouth every 4 (four) hours as needed for headache., Disp: 20 tablet, Rfl: 3 .  carvedilol (COREG) 6.25 MG tablet, Take by mouth., Disp: , Rfl:  .  clopidogrel (PLAVIX) 75 MG tablet, , Disp: , Rfl:  .  cyanocobalamin 1000 MCG tablet, Take 1,000 mcg by mouth daily. , Disp: , Rfl:  .  ENTRESTO 24-26 MG, , Disp: , Rfl:  .  furosemide (LASIX) 20 MG tablet, Take 1 tablet (20 mg total) by mouth daily as needed., Disp: 30 tablet, Rfl: 11 .  gabapentin (NEURONTIN) 100 MG capsule, Take 100 mg by mouth at bedtime., Disp: , Rfl:  .   loratadine (CLARITIN) 10 MG tablet, Take 10 mg by mouth daily. , Disp: , Rfl:  .  Multiple Vitamins-Minerals (MULTIVITAMIN WITH MINERALS) tablet, Take 1 tablet by mouth daily., Disp: , Rfl:  .  nitroGLYCERIN (NITROSTAT) 0.4 MG SL tablet, Place 1 tablet (0.4 mg total) under the tongue every 5 (five) minutes as needed for chest pain., Disp: 30 tablet, Rfl: 3 .  pantoprazole (PROTONIX) 40 MG tablet, Take 40 mg by mouth daily., Disp: , Rfl:  .  potassium chloride (K-DUR,KLOR-CON) 10 MEQ tablet, TAKE 1 TABLET BY MOUTH TWO  TIMES DAILY (Patient taking differently: Take 10 mEq by mouth 2 (two) times daily. ), Disp: 180 tablet, Rfl: 3 .  promethazine (PHENERGAN) 25 MG tablet, Take 1/2-1 tablet by mouth every 8 hours as needed for nausea., Disp: 30 tablet, Rfl: 1 .  sertraline (ZOLOFT) 50 MG tablet, TAKE 1 TABLET BY MOUTH  DAILY, Disp: 90 tablet, Rfl: 3 .  butalbital-acetaminophen-caffeine (FIORICET) 50-325-40 MG tablet, Take 1-2 tablets by mouth every 6 (six) hours as needed for headache. (Patient not taking: Reported on 08/05/2019), Disp: 25 tablet, Rfl: 2 .  butalbital-acetaminophen-caffeine (FIORICET) 50-325-40 MG tablet, Take 1 tablet by mouth every 6 (six) hours as needed for headache. (Patient not taking: Reported on 08/05/2019), Disp: 30 tablet, Rfl: 1 .  fluticasone (FLONASE) 50 MCG/ACT nasal spray, Place 2 sprays into both nostrils daily. (Patient not  taking: Reported on 05/05/2019), Disp: 16 g, Rfl: 6 .  sacubitril-valsartan (ENTRESTO) 49-51 MG, Take 1 tablet by mouth 2 (two) times daily., Disp: , Rfl:   Review of Systems  Constitutional: Negative for appetite change, chills, fatigue and fever.  HENT: Negative.   Eyes: Negative.   Respiratory: Negative for chest tightness and shortness of breath.   Cardiovascular: Negative for chest pain and palpitations.  Gastrointestinal: Negative for abdominal pain, nausea and vomiting.  Endocrine: Negative.   Skin: Negative.   Allergic/Immunologic: Negative.    Neurological: Negative for dizziness and weakness.  Psychiatric/Behavioral: Negative.     Social History   Tobacco Use  . Smoking status: Former Smoker    Packs/day: 0.50    Years: 20.00    Pack years: 10.00    Quit date: 10/03/2003    Years since quitting: 15.8  . Smokeless tobacco: Never Used  Substance Use Topics  . Alcohol use: No      Objective:   BP 133/69 (BP Location: Left Arm, Patient Position: Sitting, Cuff Size: Large)   Pulse 66   Temp (!) 96.9 F (36.1 C) (Other (Comment))   Resp 18   Ht 5\' 3"  (1.6 m)   Wt 263 lb (119.3 kg)   SpO2 97%   BMI 46.59 kg/m  Vitals:   08/05/19 1113  BP: 133/69  Pulse: 66  Resp: 18  Temp: (!) 96.9 F (36.1 C)  TempSrc: Other (Comment)  SpO2: 97%  Weight: 263 lb (119.3 kg)  Height: 5\' 3"  (1.6 m)  Body mass index is 46.59 kg/m.   Physical Exam Vitals signs reviewed.  Constitutional:      Appearance: She is obese.  HENT:     Head: Normocephalic and atraumatic.  Neck:     Musculoskeletal: Neck supple.  Cardiovascular:     Rate and Rhythm: Normal rate and regular rhythm.  Pulmonary:     Effort: Pulmonary effort is normal.     Breath sounds: Normal breath sounds.  Abdominal:     Palpations: Abdomen is soft.  Skin:    General: Skin is warm and dry.  Neurological:     Mental Status: She is alert and oriented to person, place, and time. Mental status is at baseline.  Psychiatric:        Mood and Affect: Mood normal.        Speech: Speech normal.        Behavior: Behavior normal.      No results found for any visits on 08/05/19.     Assessment & Plan    1. Need for influenza vaccination  - Flu Vaccine QUAD High Dose(Fluad)  2. Chronic systolic CHF (congestive heart failure), NYHA class 3 (HCC) Clinically stable.More than 50% 25 minute visit spent in counseling or coordination of care  - CBC with Differential/Platelet  3. Hypercholesterolemia  - Lipid panel - TSH  4. Essential hypertension  -  Comprehensive metabolic panel  5. Coronary artery disease with angina pectoris, unspecified vessel or lesion type, unspecified whether native or transplanted heart ([place]) All risk factors treated. Follow up in 6 months.   6.Obesity  Richard Cranford Mon, MD  [facility]

## 2019-08-17 ENCOUNTER — Other Ambulatory Visit: Payer: Self-pay | Admitting: Family Medicine

## 2019-08-18 MED ORDER — SACUBITRIL 49 MG-VALSARTAN 51 MG TABLET: 1 | tablet | Freq: Two times a day (BID) | 2 refills | 90 days | Status: AC

## 2019-08-18 MED ORDER — SACUBITRIL 49 MG-VALSARTAN 51 MG TABLET
ORAL_TABLET | Freq: Two times a day (BID) | ORAL | 2 refills | 90 days | Status: CP
Start: 2019-08-18 — End: 2019-08-18

## 2019-09-04 ENCOUNTER — Encounter: Admit: 2019-09-04 | Discharge: 2019-09-04 | Payer: MEDICARE

## 2019-09-04 ENCOUNTER — Encounter: Admit: 2019-09-04 | Discharge: 2019-09-04 | Payer: MEDICARE | Attending: Specialist | Primary: Specialist

## 2019-09-04 DIAGNOSIS — I6523 Occlusion and stenosis of bilateral carotid arteries: Principal | ICD-10-CM

## 2019-09-04 DIAGNOSIS — I6522 Occlusion and stenosis of left carotid artery: Secondary | ICD-10-CM | POA: Diagnosis not present

## 2019-09-04 DIAGNOSIS — I10 Essential (primary) hypertension: Secondary | ICD-10-CM | POA: Diagnosis not present

## 2019-09-04 DIAGNOSIS — E785 Hyperlipidemia, unspecified: Secondary | ICD-10-CM | POA: Diagnosis not present

## 2019-09-04 DIAGNOSIS — Z87891 Personal history of nicotine dependence: Secondary | ICD-10-CM | POA: Diagnosis not present

## 2019-09-05 DIAGNOSIS — I10 Essential (primary) hypertension: Secondary | ICD-10-CM | POA: Diagnosis not present

## 2019-09-05 DIAGNOSIS — E7800 Pure hypercholesterolemia, unspecified: Secondary | ICD-10-CM | POA: Diagnosis not present

## 2019-09-05 DIAGNOSIS — I5022 Chronic systolic (congestive) heart failure: Secondary | ICD-10-CM | POA: Diagnosis not present

## 2019-09-06 LAB — COMPREHENSIVE METABOLIC PANEL
ALT: 11 IU/L (ref 0–32)
AST: 15 IU/L (ref 0–40)
Albumin/Globulin Ratio: 1.9 (ref 1.2–2.2)
Albumin: 3.9 g/dL (ref 3.7–4.7)
Alkaline Phosphatase: 108 IU/L (ref 39–117)
BUN/Creatinine Ratio: 20 (ref 12–28)
BUN: 18 mg/dL (ref 8–27)
Bilirubin Total: 0.4 mg/dL (ref 0.0–1.2)
CO2: 25 mmol/L (ref 20–29)
Calcium: 9 mg/dL (ref 8.7–10.3)
Chloride: 107 mmol/L — ABNORMAL HIGH (ref 96–106)
Creatinine, Ser: 0.91 mg/dL (ref 0.57–1.00)
GFR calc Af Amer: 72 mL/min/{1.73_m2} (ref >59)
GFR calc non Af Amer: 63 mL/min/{1.73_m2} (ref >59)
Globulin, Total: 2.1 g/dL (ref 1.5–4.5)
Glucose: 97 mg/dL (ref 65–99)
Potassium: 4.3 mmol/L (ref 3.5–5.2)
Sodium: 146 mmol/L — ABNORMAL HIGH (ref 134–144)
Total Protein: 6 g/dL (ref 6.0–8.5)

## 2019-09-06 LAB — CBC WITH DIFFERENTIAL/PLATELET
Basophils Absolute: 0 10*3/uL (ref 0.0–0.2)
Basos: 1 %
EOS (ABSOLUTE): 0.2 10*3/uL (ref 0.0–0.4)
Eos: 4 %
Hematocrit: 37.1 % (ref 34.0–46.6)
Hemoglobin: 11.7 g/dL (ref 11.1–15.9)
Immature Grans (Abs): 0 10*3/uL (ref 0.0–0.1)
Immature Granulocytes: 0 %
Lymphocytes Absolute: 0.9 10*3/uL (ref 0.7–3.1)
Lymphs: 22 %
MCH: 28.5 pg (ref 26.6–33.0)
MCHC: 31.5 g/dL (ref 31.5–35.7)
MCV: 90 fL (ref 79–97)
Monocytes Absolute: 0.3 10*3/uL (ref 0.1–0.9)
Monocytes: 7 %
Neutrophils Absolute: 2.7 10*3/uL (ref 1.4–7.0)
Neutrophils: 66 %
Platelets: 197 10*3/uL (ref 150–450)
RBC: 4.11 x10E6/uL (ref 3.77–5.28)
RDW: 12.8 % (ref 11.7–15.4)
WBC: 4.2 10*3/uL (ref 3.4–10.8)

## 2019-09-06 LAB — LIPID PANEL
Chol/HDL Ratio: 3.1 ratio (ref 0.0–4.4)
Cholesterol, Total: 179 mg/dL (ref 100–199)
HDL: 57 mg/dL (ref >39)
LDL Chol Calc (NIH): 105 mg/dL — ABNORMAL HIGH (ref 0–99)
Triglycerides: 91 mg/dL (ref 0–149)
VLDL Cholesterol Cal: 17 mg/dL (ref 5–40)

## 2019-09-06 LAB — TSH: TSH: 2.52 u[IU]/mL (ref 0.450–4.500)

## 2019-09-15 ENCOUNTER — Telehealth: Payer: Self-pay | Admitting: Family Medicine

## 2019-09-15 ENCOUNTER — Other Ambulatory Visit: Payer: Self-pay | Admitting: Family Medicine

## 2019-09-15 NOTE — Telephone Encounter (Signed)
Requested medication (s) are due for refill today:   Not sure   last prescribed by a historical provider.   No other information given as far as dates given.  Requested medication (s) are on the active medication list:   Not sure  Future visit scheduled:   Yes   Last ordered: No information.   Ordered by a historical provider.        Requested Prescriptions  Pending Prescriptions Disp Refills   pantoprazole (PROTONIX) 40 MG tablet [Pharmacy Med Name: PANTOPRAZOLE SOD 40MG  EC TABLET] 90 tablet 3    Sig: TAKE 1 TABLET BY MOUTH  EVERY MORNING      Gastroenterology: Proton Pump Inhibitors Passed - 09/15/2019  5:55 AM      Passed - Valid encounter within last 12 months    Recent Outpatient Visits           1 month ago Chronic systolic CHF (congestive heart failure), NYHA class 3 ([place])   [facility] Jerrol Banana., MD   4 months ago Chronic systolic CHF (congestive heart failure), NYHA class 3 [facility])   [facility] Jerrol Banana., MD   6 months ago Cerebrovascular accident (CVA) due to bilateral stenosis of carotid arteries [facility])   [facility] Jerrol Banana., MD   11 months ago TIA (transient ischemic attack)   [facility] Jerrol Banana., MD   1 year ago URI with cough and congestion   [place], [place]       Future Appointments             In 4 months Jerrol Banana., MD [facility] [place]

## 2019-09-15 NOTE — Telephone Encounter (Signed)
Copied from [place] [id number]. Topic: General - Other >> Sep 15, 2019 11:34 AM Rainey Pines A wrote: Patient would like to speak with nurse in regards to continuing her acid reflux medication. Please advise

## 2019-09-16 NOTE — Telephone Encounter (Signed)
Patient was confused about which acid reflux medication she is taking. Advised pt which medication she is taking. Patient states she has been having breakthrough acid reflux/heartburn for the past week every night. Please advise?

## 2019-09-17 NOTE — Telephone Encounter (Signed)
Pantoprazole daily is listed--go to BID

## 2019-09-17 NOTE — Telephone Encounter (Signed)
Patient was advised. Expressed understanding.  

## 2019-09-30 ENCOUNTER — Other Ambulatory Visit: Payer: Self-pay | Admitting: Family Medicine

## 2019-09-30 DIAGNOSIS — E7800 Pure hypercholesterolemia, unspecified: Secondary | ICD-10-CM

## 2019-09-30 NOTE — Telephone Encounter (Signed)
Pt request refill  gabapentin (NEURONTIN) 100 MG capsule   [place], [place] [employer] Phone:  [phone]  Fax:  [phone]      And would like a call when this has been done

## 2019-10-01 MED ORDER — GABAPENTIN 100 MG PO CAPS: 100.0000 mg | ORAL_CAPSULE | Freq: Every day | ORAL | 3 refills | Status: DC

## 2019-10-07 ENCOUNTER — Other Ambulatory Visit: Payer: Self-pay | Admitting: Family Medicine

## 2019-10-07 DIAGNOSIS — I6523 Occlusion and stenosis of bilateral carotid arteries: Principal | ICD-10-CM

## 2019-10-07 MED ORDER — CLOPIDOGREL 75 MG TABLET
ORAL_TABLET | Freq: Every day | ORAL | 3 refills | 30 days | Status: CP
Start: 2019-10-07 — End: 2020-10-06

## 2019-10-15 DIAGNOSIS — I6523 Occlusion and stenosis of bilateral carotid arteries: Principal | ICD-10-CM

## 2019-10-15 MED ORDER — CLOPIDOGREL 75 MG TABLET
ORAL_TABLET | Freq: Every day | ORAL | 3 refills | 90.00000 days | Status: CP
Start: 2019-10-15 — End: 2020-10-14

## 2019-10-15 MED ORDER — CLOPIDOGREL 75 MG TABLET: 75 mg | tablet | Freq: Every day | 3 refills | 90 days | Status: AC

## 2019-10-27 DIAGNOSIS — I6523 Occlusion and stenosis of bilateral carotid arteries: Principal | ICD-10-CM

## 2019-10-30 ENCOUNTER — Encounter
Admit: 2019-10-30 | Discharge: 2019-10-31 | Payer: MEDICARE | Attending: Cardiovascular Disease | Primary: Cardiovascular Disease

## 2019-10-30 DIAGNOSIS — I442 Atrioventricular block, complete: Principal | ICD-10-CM

## 2019-10-30 DIAGNOSIS — I10 Essential (primary) hypertension: Principal | ICD-10-CM

## 2019-10-30 DIAGNOSIS — R197 Diarrhea, unspecified: Principal | ICD-10-CM

## 2019-10-30 DIAGNOSIS — E782 Mixed hyperlipidemia: Principal | ICD-10-CM

## 2019-10-30 DIAGNOSIS — Z95810 Presence of automatic (implantable) cardiac defibrillator: Principal | ICD-10-CM

## 2019-10-30 MED ORDER — LOSARTAN 50 MG TABLET
ORAL_TABLET | Freq: Every day | ORAL | 3 refills | 90.00000 days | Status: CP
Start: 2019-10-30 — End: 2019-11-29

## 2019-11-03 MED ORDER — LOSARTAN 50 MG TABLET
ORAL_TABLET | Freq: Every day | ORAL | 3 refills | 90 days | Status: CP
Start: 2019-11-03 — End: 2019-12-03

## 2019-11-04 ENCOUNTER — Encounter: Admit: 2019-11-04 | Discharge: 2019-11-05 | Payer: MEDICARE

## 2019-11-04 DIAGNOSIS — Z4502 Encounter for adjustment and management of automatic implantable cardiac defibrillator: Principal | ICD-10-CM

## 2019-11-07 DIAGNOSIS — R410 Disorientation, unspecified: Secondary | ICD-10-CM | POA: Diagnosis not present

## 2019-11-07 DIAGNOSIS — I110 Hypertensive heart disease with heart failure: Secondary | ICD-10-CM | POA: Diagnosis not present

## 2019-11-07 DIAGNOSIS — R202 Paresthesia of skin: Secondary | ICD-10-CM | POA: Diagnosis not present

## 2019-11-07 DIAGNOSIS — I509 Heart failure, unspecified: Secondary | ICD-10-CM | POA: Diagnosis not present

## 2019-11-07 DIAGNOSIS — Z8673 Personal history of transient ischemic attack (TIA), and cerebral infarction without residual deficits: Secondary | ICD-10-CM | POA: Diagnosis not present

## 2019-11-07 DIAGNOSIS — E119 Type 2 diabetes mellitus without complications: Secondary | ICD-10-CM | POA: Diagnosis not present

## 2019-11-07 DIAGNOSIS — Z7982 Long term (current) use of aspirin: Secondary | ICD-10-CM | POA: Diagnosis not present

## 2019-11-07 DIAGNOSIS — H547 Unspecified visual loss: Secondary | ICD-10-CM | POA: Diagnosis not present

## 2019-11-07 DIAGNOSIS — N179 Acute kidney failure, unspecified: Secondary | ICD-10-CM | POA: Diagnosis not present

## 2019-11-07 DIAGNOSIS — R5383 Other fatigue: Secondary | ICD-10-CM | POA: Diagnosis not present

## 2019-11-07 DIAGNOSIS — G43909 Migraine, unspecified, not intractable, without status migrainosus: Secondary | ICD-10-CM | POA: Diagnosis not present

## 2019-11-07 DIAGNOSIS — M6281 Muscle weakness (generalized): Secondary | ICD-10-CM | POA: Diagnosis not present

## 2019-11-07 DIAGNOSIS — R9431 Abnormal electrocardiogram [ECG] [EKG]: Secondary | ICD-10-CM | POA: Diagnosis not present

## 2019-11-07 DIAGNOSIS — R404 Transient alteration of awareness: Secondary | ICD-10-CM | POA: Diagnosis not present

## 2019-11-07 DIAGNOSIS — Z87891 Personal history of nicotine dependence: Secondary | ICD-10-CM | POA: Diagnosis not present

## 2019-11-07 DIAGNOSIS — I10 Essential (primary) hypertension: Secondary | ICD-10-CM | POA: Diagnosis not present

## 2019-11-07 DIAGNOSIS — Z880 Allergy status to penicillin: Secondary | ICD-10-CM | POA: Diagnosis not present

## 2019-11-07 DIAGNOSIS — H538 Other visual disturbances: Secondary | ICD-10-CM | POA: Diagnosis not present

## 2019-11-07 DIAGNOSIS — E785 Hyperlipidemia, unspecified: Secondary | ICD-10-CM | POA: Diagnosis not present

## 2019-11-07 DIAGNOSIS — Z7902 Long term (current) use of antithrombotics/antiplatelets: Secondary | ICD-10-CM | POA: Diagnosis not present

## 2019-11-07 DIAGNOSIS — Z743 Need for continuous supervision: Secondary | ICD-10-CM | POA: Diagnosis not present

## 2019-11-07 DIAGNOSIS — Z91041 Radiographic dye allergy status: Secondary | ICD-10-CM | POA: Diagnosis not present

## 2019-11-07 DIAGNOSIS — K219 Gastro-esophageal reflux disease without esophagitis: Secondary | ICD-10-CM | POA: Diagnosis not present

## 2019-11-07 DIAGNOSIS — I6523 Occlusion and stenosis of bilateral carotid arteries: Secondary | ICD-10-CM | POA: Diagnosis not present

## 2019-11-08 ENCOUNTER — Encounter
Admit: 2019-11-08 | Discharge: 2019-11-08 | Disposition: A | Discharge status: Home / Self Care | Payer: MEDICARE | Attending: Emergency Medicine

## 2019-11-08 DIAGNOSIS — R404 Transient alteration of awareness: Secondary | ICD-10-CM | POA: Diagnosis not present

## 2019-11-24 ENCOUNTER — Other Ambulatory Visit: Payer: Self-pay

## 2019-11-24 ENCOUNTER — Ambulatory Visit: Payer: Medicare Other | Attending: Internal Medicine

## 2019-11-24 DIAGNOSIS — Z23 Encounter for immunization: Secondary | ICD-10-CM

## 2019-11-24 NOTE — Progress Notes (Signed)
   Covid-19 Vaccination Clinic  Name:  Anna Marsh    MRN: [id number] DOB: December 30, 1945  11/24/2019  Anna Marsh was observed post Covid-19 immunization for 15 minutes without incidence. She was provided with Vaccine Information Sheet and instruction to access the V-Safe system.   Anna Marsh was instructed to call 911 with any severe reactions post vaccine: Marland Kitchen Difficulty breathing  . Swelling of your face and throat  . A fast heartbeat  . A bad rash all over your body  . Dizziness and weakness    Immunizations Administered    Name Date Dose VIS Date Route   Pfizer COVID-19 Vaccine 11/24/2019 10:32 AM 0.3 mL 09/12/2019 Intramuscular   Manufacturer: [place]   Lot: [id number]   [place]: [id number]

## 2019-12-05 ENCOUNTER — Encounter: Admit: 2019-12-05 | Discharge: 2019-12-05 | Payer: MEDICARE

## 2019-12-05 ENCOUNTER — Ambulatory Visit: Admit: 2019-12-05 | Discharge: 2019-12-05 | Payer: MEDICARE

## 2019-12-05 DIAGNOSIS — R1907 Generalized intra-abdominal and pelvic swelling, mass and lump: Secondary | ICD-10-CM | POA: Diagnosis not present

## 2019-12-05 DIAGNOSIS — R197 Diarrhea, unspecified: Secondary | ICD-10-CM | POA: Diagnosis not present

## 2019-12-09 ENCOUNTER — Encounter: Admit: 2019-12-09 | Discharge: 2019-12-09 | Payer: MEDICARE

## 2019-12-09 DIAGNOSIS — R197 Diarrhea, unspecified: Secondary | ICD-10-CM | POA: Diagnosis not present

## 2019-12-10 ENCOUNTER — Other Ambulatory Visit: Payer: Self-pay | Admitting: Family Medicine

## 2019-12-16 ENCOUNTER — Ambulatory Visit: Payer: Medicare Other | Attending: Internal Medicine

## 2019-12-16 DIAGNOSIS — Z23 Encounter for immunization: Secondary | ICD-10-CM

## 2019-12-16 NOTE — Progress Notes (Signed)
   Covid-19 Vaccination Clinic  Name:  Anna Marsh    MRN: [id number] DOB: 13-Feb-1946  12/16/2019  [name] was observed post Covid-19 immunization for 15 minutes without incident. She was provided with Vaccine Information Sheet and instruction to access the V-Safe system.   [name] was instructed to call 911 with any severe reactions post vaccine: Marland Kitchen Difficulty breathing  . Swelling of face and throat  . A fast heartbeat  . A bad rash all over body  . Dizziness and weakness   Immunizations Administered    Name Date Dose VIS Date Route   Pfizer COVID-19 Vaccine 12/16/2019 10:43 AM 0.3 mL 09/12/2019 Intramuscular   Manufacturer: [place]   Lot: [id number]   [place]: [id number]

## 2019-12-25 ENCOUNTER — Encounter
Admit: 2019-12-25 | Discharge: 2019-12-26 | Payer: MEDICARE | Attending: Student in an Organized Health Care Education/Training Program | Primary: Student in an Organized Health Care Education/Training Program

## 2019-12-25 DIAGNOSIS — Z8673 Personal history of transient ischemic attack (TIA), and cerebral infarction without residual deficits: Secondary | ICD-10-CM | POA: Diagnosis not present

## 2019-12-25 DIAGNOSIS — G43109 Migraine with aura, not intractable, without status migrainosus: Secondary | ICD-10-CM | POA: Diagnosis not present

## 2020-01-28 NOTE — Progress Notes (Signed)
Established patient visit   Patient: Anna Marsh   DOB: 01-07-1946   74 y.o. Female  MRN: [id number] Visit Date: 02/02/2020  Today's healthcare provider: Wilhemena Durie, MD   Chief Complaint  Patient presents with  . Hypertension  . Hyperlipidemia   Subjective    HPI   Patient would like to discuss discontinuing Sertraline. Patient had house call and was told that she does not have diabetes.   She is prediabetic.  She is doing well emotionally.  She would like to come off the sertraline. Her cardiologist is stopped her Entresto when she has been doing well.   Hypertension, follow-up  BP Readings from Last 3 Encounters:  02/02/20 (!) 97/58  08/05/19 133/69  05/05/19 (!) 84/52   She was last seen for hypertension 6 months ago.  BP at that visit was 133/69. Management since that visit includes; continue current medication. She reports excellent compliance with treatment. She is not having side effects.  She is exercising. She is adherent to low salt diet.   Outside blood pressures are 124/62.  She does not smoke.  Use of agents associated with hypertension: none.   ----------------------------------------------------------------------------------------- Lipid/Cholesterol, follow-up  Last Lipid Panel: Lab Results  Component Value Date   CHOL 179 09/05/2019   LDLCALC 105 (H) 09/05/2019   HDL 57 09/05/2019   TRIG 91 09/05/2019   ALT 11 09/05/2019   AST 15 09/05/2019   PLT 197 09/05/2019    She was last seen for this 5 months ago.  Management since that visit includes; stable. She reports excellent compliance with treatment. She is not having side effects.  She is following a Low Sodium diet. Current exercise: yard work  [employer] from Last 3 Encounters:  02/02/20 268 lb 3.2 oz (121.7 kg)  08/05/19 263 lb (119.3 kg)  05/05/19 259 lb (117.5 kg)   Last metabolic panel Lab Results  Component Value Date   GLUCOSE 97 09/05/2019   NA 146 (H)  09/05/2019   K 4.3 09/05/2019   BUN 18 09/05/2019   CREATININE 0.91 09/05/2019   GFRNONAA 63 09/05/2019   GFRAA 72 09/05/2019   CALCIUM 9.0 09/05/2019   AST 15 09/05/2019   ALT 11 09/05/2019   The ASCVD Risk score (Goff DC Jr., et al., 2013) failed to calculate for the following reasons:   The patient has a prior MI or stroke diagnosis  -----------------------------------------------------------------------------------------        Medications: Outpatient Medications Prior to Visit  Medication Sig  . atorvastatin (LIPITOR) 80 MG tablet TAKE 1 TABLET BY MOUTH  DAILY  . butalbital-acetaminophen-caffeine (FIORICET) 50-325-40 MG tablet Take 1 tablet by mouth every 4 (four) hours as needed for headache.  . clopidogrel (PLAVIX) 75 MG tablet   . cyanocobalamin 1000 MCG tablet Take 1,000 mcg by mouth daily.   . furosemide (LASIX) 20 MG tablet TAKE 1 TABLET BY MOUTH  DAILY AS NEEDED  . gabapentin (NEURONTIN) 100 MG capsule Take 1 capsule (100 mg total) by mouth at bedtime.  Marland Kitchen loratadine (CLARITIN) 10 MG tablet Take 10 mg by mouth daily.   . Multiple Vitamins-Minerals (MULTIVITAMIN WITH MINERALS) tablet Take 1 tablet by mouth daily.  . pantoprazole (PROTONIX) 40 MG tablet TAKE 1 TABLET BY MOUTH  EVERY MORNING  . potassium chloride (KLOR-CON) 10 MEQ tablet TAKE 1 TABLET BY MOUTH  TWICE DAILY  . sertraline (ZOLOFT) 50 MG tablet TAKE 1 TABLET BY MOUTH  DAILY  . butalbital-acetaminophen-caffeine (FIORICET) 50-325-40 MG  tablet Take 1-2 tablets by mouth every 6 (six) hours as needed for headache. (Patient not taking: Reported on 08/05/2019)  . butalbital-acetaminophen-caffeine (FIORICET) 50-325-40 MG tablet Take 1 tablet by mouth every 6 (six) hours as needed for headache. (Patient not taking: Reported on 08/05/2019)  . carvedilol (COREG) 6.25 MG tablet Take by mouth.  . ENTRESTO 24-26 MG   . fluticasone (FLONASE) 50 MCG/ACT nasal spray Place 2 sprays into both nostrils daily. (Patient not taking:  Reported on 05/05/2019)  . nitroGLYCERIN (NITROSTAT) 0.4 MG SL tablet Place 1 tablet (0.4 mg total) under the tongue every 5 (five) minutes as needed for chest pain.  . promethazine (PHENERGAN) 25 MG tablet Take 1/2-1 tablet by mouth every 8 hours as needed for nausea. (Patient not taking: Reported on 02/02/2020)  . sacubitril-valsartan (ENTRESTO) 49-51 MG Take 1 tablet by mouth 2 (two) times daily.   No facility-administered medications prior to visit.    Review of Systems  Constitutional: Negative for appetite change, chills, fatigue and fever.  HENT: Negative.   Eyes: Negative.   Respiratory: Negative for chest tightness and shortness of breath.   Cardiovascular: Negative for chest pain and palpitations.  Gastrointestinal: Negative for abdominal pain, nausea and vomiting.  Endocrine: Negative.   Allergic/Immunologic: Negative.   Neurological: Negative for dizziness and weakness.  Hematological: Negative.   Psychiatric/Behavioral: Negative.     Last hemoglobin A1c Lab Results  Component Value Date   HGBA1C 5.5 03/11/2019      Objective    BP (!) 97/58 (BP Location: Right Arm, Patient Position: Sitting, Cuff Size: Large)   Pulse 65   Temp (!) 97.1 F (36.2 C) (Temporal)   Ht 5\' 3"  (1.6 m)   Wt 268 lb 3.2 oz (121.7 kg)   BMI 47.51 kg/m  BP Readings from Last 3 Encounters:  02/02/20 (!) 97/58  08/05/19 133/69  05/05/19 (!) 84/52   Wt Readings from Last 3 Encounters:  02/02/20 268 lb 3.2 oz (121.7 kg)  08/05/19 263 lb (119.3 kg)  05/05/19 259 lb (117.5 kg)      Physical Exam Vitals reviewed.  Constitutional:      Appearance: Normal appearance.  Eyes:     General: No scleral icterus.    Conjunctiva/sclera: Conjunctivae normal.  Neck:     Vascular: No carotid bruit.  Cardiovascular:     Rate and Rhythm: Normal rate and regular rhythm.     Pulses: Normal pulses.     Heart sounds: Normal heart sounds.  Pulmonary:     Effort: Pulmonary effort is normal.      Breath sounds: Normal breath sounds.  Musculoskeletal:     Right lower leg: No edema.     Left lower leg: No edema.  Skin:    General: Skin is warm and dry.  Neurological:     Mental Status: She is alert and oriented to person, place, and time. Mental status is at baseline.  Psychiatric:        Mood and Affect: Mood normal.        Behavior: Behavior normal.        Thought Content: Thought content normal.        Judgment: Judgment normal.       No results found for any visits on 02/02/20.  Assessment & Plan    1. Essential hypertension On Coreg  2. Hypercholesterolemia On Lipitor 80  3. Acute anxiety Wean sertraline to 1/2 tablet daily for 2 weeks then 1/2 tablet every other day for  2 weeks then stop.  I will see her back in about 3 months  4. Chronic systolic CHF (congestive heart failure), NYHA class 3 (HCC) Off of Entresto per cardiology  5. Ischemic cardiomyopathy   6. Pre-diabetes Follow every 6 months.  Can follow with sugars.  A1c when appropriate  7. Class 3 severe obesity due to excess calories with serious comorbidity and body mass index (BMI) of 45.0 to 49.9 in adult [facility]) Lifestyle has been stressed.  8. Cardiac defibrillator in place   No follow-ups on file.      I, Wilhemena Durie, MD, have reviewed all documentation for this visit. The documentation on 02/02/20 for the exam, diagnosis, procedures, and orders are all accurate and complete.    Roberts Bon Cranford Mon, MD  [facility] [phone] (phone) [phone] (fax)  [place]

## 2020-02-02 ENCOUNTER — Encounter: Payer: Self-pay | Admitting: Family Medicine

## 2020-02-02 ENCOUNTER — Ambulatory Visit (INDEPENDENT_AMBULATORY_CARE_PROVIDER_SITE_OTHER): Payer: Medicare Other | Admitting: Family Medicine

## 2020-02-02 ENCOUNTER — Other Ambulatory Visit: Payer: Self-pay

## 2020-02-02 VITALS — BP 97/58 | HR 65 | Temp 97.1°F | Ht 63.0 in | Wt 268.2 lb

## 2020-02-02 DIAGNOSIS — I10 Essential (primary) hypertension: Secondary | ICD-10-CM | POA: Diagnosis not present

## 2020-02-02 DIAGNOSIS — I255 Ischemic cardiomyopathy: Secondary | ICD-10-CM

## 2020-02-02 DIAGNOSIS — Z6841 Body Mass Index (BMI) 40.0 and over, adult: Secondary | ICD-10-CM

## 2020-02-02 DIAGNOSIS — E7800 Pure hypercholesterolemia, unspecified: Secondary | ICD-10-CM

## 2020-02-02 DIAGNOSIS — I5022 Chronic systolic (congestive) heart failure: Secondary | ICD-10-CM | POA: Diagnosis not present

## 2020-02-02 DIAGNOSIS — Z95810 Presence of automatic (implantable) cardiac defibrillator: Secondary | ICD-10-CM

## 2020-02-02 DIAGNOSIS — R7303 Prediabetes: Secondary | ICD-10-CM

## 2020-02-02 DIAGNOSIS — F419 Anxiety disorder, unspecified: Secondary | ICD-10-CM

## 2020-02-02 NOTE — Patient Instructions (Signed)
STOP MULTIVITAMIN !!!   SERTRALINE- TAKE 1 TABLET EVERY DAY FOR 2 WEEKS AND THEN HALF A TABLET EVERY OTHER DAY FOR TWO WEEKS.

## 2020-02-03 ENCOUNTER — Encounter: Admit: 2020-02-03 | Discharge: 2020-02-04 | Payer: MEDICARE

## 2020-02-03 DIAGNOSIS — Z4502 Encounter for adjustment and management of automatic implantable cardiac defibrillator: Secondary | ICD-10-CM | POA: Diagnosis not present

## 2020-02-03 DIAGNOSIS — I442 Atrioventricular block, complete: Secondary | ICD-10-CM | POA: Diagnosis not present

## 2020-02-09 ENCOUNTER — Other Ambulatory Visit: Payer: Self-pay | Admitting: Family Medicine

## 2020-02-09 DIAGNOSIS — Z1231 Encounter for screening mammogram for malignant neoplasm of breast: Secondary | ICD-10-CM

## 2020-02-19 DIAGNOSIS — H1849 Other corneal degeneration: Secondary | ICD-10-CM | POA: Diagnosis not present

## 2020-02-25 ENCOUNTER — Ambulatory Visit: Admit: 2020-02-25 | Discharge: 2020-02-25 | Payer: MEDICARE

## 2020-02-25 ENCOUNTER — Encounter: Admit: 2020-02-25 | Discharge: 2020-02-25 | Payer: MEDICARE | Attending: Registered Nurse | Primary: Registered Nurse

## 2020-02-25 DIAGNOSIS — Z743 Need for continuous supervision: Secondary | ICD-10-CM | POA: Diagnosis not present

## 2020-02-25 DIAGNOSIS — D122 Benign neoplasm of ascending colon: Secondary | ICD-10-CM | POA: Diagnosis not present

## 2020-02-25 DIAGNOSIS — I480 Paroxysmal atrial fibrillation: Secondary | ICD-10-CM | POA: Diagnosis not present

## 2020-02-25 DIAGNOSIS — Z880 Allergy status to penicillin: Secondary | ICD-10-CM | POA: Diagnosis not present

## 2020-02-25 DIAGNOSIS — Z95810 Presence of automatic (implantable) cardiac defibrillator: Secondary | ICD-10-CM | POA: Diagnosis not present

## 2020-02-25 DIAGNOSIS — R410 Disorientation, unspecified: Secondary | ICD-10-CM | POA: Diagnosis not present

## 2020-02-25 DIAGNOSIS — E119 Type 2 diabetes mellitus without complications: Secondary | ICD-10-CM | POA: Diagnosis not present

## 2020-02-25 DIAGNOSIS — I5022 Chronic systolic (congestive) heart failure: Secondary | ICD-10-CM | POA: Diagnosis not present

## 2020-02-25 DIAGNOSIS — K640 First degree hemorrhoids: Secondary | ICD-10-CM | POA: Diagnosis not present

## 2020-02-25 DIAGNOSIS — Z1211 Encounter for screening for malignant neoplasm of colon: Secondary | ICD-10-CM | POA: Diagnosis not present

## 2020-02-25 DIAGNOSIS — R61 Generalized hyperhidrosis: Secondary | ICD-10-CM | POA: Diagnosis not present

## 2020-02-25 DIAGNOSIS — I2510 Atherosclerotic heart disease of native coronary artery without angina pectoris: Secondary | ICD-10-CM | POA: Diagnosis not present

## 2020-02-25 DIAGNOSIS — R197 Diarrhea, unspecified: Secondary | ICD-10-CM | POA: Diagnosis not present

## 2020-02-25 DIAGNOSIS — K635 Polyp of colon: Secondary | ICD-10-CM | POA: Diagnosis not present

## 2020-02-25 DIAGNOSIS — Z87891 Personal history of nicotine dependence: Secondary | ICD-10-CM | POA: Diagnosis not present

## 2020-02-25 DIAGNOSIS — Z8673 Personal history of transient ischemic attack (TIA), and cerebral infarction without residual deficits: Secondary | ICD-10-CM | POA: Diagnosis not present

## 2020-02-25 DIAGNOSIS — Z86010 Personal history of colonic polyps: Secondary | ICD-10-CM | POA: Diagnosis not present

## 2020-02-25 DIAGNOSIS — I110 Hypertensive heart disease with heart failure: Secondary | ICD-10-CM | POA: Diagnosis not present

## 2020-02-25 DIAGNOSIS — R55 Syncope and collapse: Secondary | ICD-10-CM | POA: Diagnosis not present

## 2020-02-25 DIAGNOSIS — E785 Hyperlipidemia, unspecified: Secondary | ICD-10-CM | POA: Diagnosis not present

## 2020-02-25 DIAGNOSIS — Z800 Family history of malignant neoplasm of digestive organs: Secondary | ICD-10-CM | POA: Diagnosis not present

## 2020-02-25 DIAGNOSIS — I429 Cardiomyopathy, unspecified: Secondary | ICD-10-CM | POA: Diagnosis not present

## 2020-02-27 DIAGNOSIS — H1132 Conjunctival hemorrhage, left eye: Secondary | ICD-10-CM | POA: Diagnosis not present

## 2020-03-08 DIAGNOSIS — H1132 Conjunctival hemorrhage, left eye: Secondary | ICD-10-CM | POA: Diagnosis not present

## 2020-03-16 ENCOUNTER — Ambulatory Visit
Admission: RE | Admit: 2020-03-16 | Discharge: 2020-03-16 | Disposition: A | Discharge status: Home / Self Care | Payer: Medicare Other | Source: Ambulatory Visit | Attending: Family Medicine | Admitting: Family Medicine

## 2020-03-16 DIAGNOSIS — Z1231 Encounter for screening mammogram for malignant neoplasm of breast: Secondary | ICD-10-CM | POA: Diagnosis not present

## 2020-03-25 ENCOUNTER — Ambulatory Visit: Admit: 2020-03-25 | Discharge: 2020-03-25 | Payer: MEDICARE

## 2020-03-25 ENCOUNTER — Ambulatory Visit: Admit: 2020-03-25 | Discharge: 2020-03-25 | Payer: MEDICARE | Attending: Specialist | Primary: Specialist

## 2020-03-25 DIAGNOSIS — I6523 Occlusion and stenosis of bilateral carotid arteries: Principal | ICD-10-CM

## 2020-03-25 DIAGNOSIS — I5022 Chronic systolic (congestive) heart failure: Secondary | ICD-10-CM | POA: Diagnosis not present

## 2020-03-25 DIAGNOSIS — Z79899 Other long term (current) drug therapy: Secondary | ICD-10-CM | POA: Diagnosis not present

## 2020-03-25 DIAGNOSIS — I6522 Occlusion and stenosis of left carotid artery: Secondary | ICD-10-CM | POA: Diagnosis not present

## 2020-03-25 DIAGNOSIS — Z8673 Personal history of transient ischemic attack (TIA), and cerebral infarction without residual deficits: Secondary | ICD-10-CM | POA: Diagnosis not present

## 2020-03-25 DIAGNOSIS — Z86010 Personal history of colonic polyps: Secondary | ICD-10-CM | POA: Diagnosis not present

## 2020-03-25 DIAGNOSIS — G43909 Migraine, unspecified, not intractable, without status migrainosus: Secondary | ICD-10-CM | POA: Diagnosis not present

## 2020-03-25 DIAGNOSIS — Z87891 Personal history of nicotine dependence: Secondary | ICD-10-CM | POA: Diagnosis not present

## 2020-03-25 DIAGNOSIS — I447 Left bundle-branch block, unspecified: Secondary | ICD-10-CM | POA: Diagnosis not present

## 2020-03-25 DIAGNOSIS — Z7982 Long term (current) use of aspirin: Secondary | ICD-10-CM | POA: Diagnosis not present

## 2020-03-25 DIAGNOSIS — I442 Atrioventricular block, complete: Secondary | ICD-10-CM | POA: Diagnosis not present

## 2020-03-25 DIAGNOSIS — E785 Hyperlipidemia, unspecified: Secondary | ICD-10-CM | POA: Diagnosis not present

## 2020-03-25 DIAGNOSIS — E119 Type 2 diabetes mellitus without complications: Secondary | ICD-10-CM | POA: Diagnosis not present

## 2020-03-25 DIAGNOSIS — Z95828 Presence of other vascular implants and grafts: Secondary | ICD-10-CM | POA: Diagnosis not present

## 2020-03-25 DIAGNOSIS — I2510 Atherosclerotic heart disease of native coronary artery without angina pectoris: Secondary | ICD-10-CM | POA: Diagnosis not present

## 2020-03-25 DIAGNOSIS — Z7902 Long term (current) use of antithrombotics/antiplatelets: Secondary | ICD-10-CM | POA: Diagnosis not present

## 2020-03-25 DIAGNOSIS — I429 Cardiomyopathy, unspecified: Secondary | ICD-10-CM | POA: Diagnosis not present

## 2020-03-25 DIAGNOSIS — I4891 Unspecified atrial fibrillation: Secondary | ICD-10-CM | POA: Diagnosis not present

## 2020-03-25 DIAGNOSIS — I110 Hypertensive heart disease with heart failure: Secondary | ICD-10-CM | POA: Diagnosis not present

## 2020-03-25 DIAGNOSIS — Z95810 Presence of automatic (implantable) cardiac defibrillator: Secondary | ICD-10-CM | POA: Diagnosis not present

## 2020-04-06 MED ORDER — CARVEDILOL 6.25 MG TABLET
ORAL_TABLET | Freq: Two times a day (BID) | ORAL | 1 refills | 90.00000 days | Status: CP
Start: 2020-04-06 — End: ?

## 2020-04-06 MED ORDER — CARVEDILOL 6.25 MG TABLET: 6 mg | tablet | Freq: Two times a day (BID) | 1 refills | 90 days | Status: AC

## 2020-04-22 ENCOUNTER — Ambulatory Visit
Admit: 2020-04-22 | Discharge: 2020-04-23 | Payer: MEDICARE | Attending: Cardiovascular Disease | Primary: Cardiovascular Disease

## 2020-04-22 DIAGNOSIS — I429 Cardiomyopathy, unspecified: Principal | ICD-10-CM

## 2020-04-22 DIAGNOSIS — I10 Essential (primary) hypertension: Principal | ICD-10-CM

## 2020-04-22 DIAGNOSIS — Z95810 Presence of automatic (implantable) cardiac defibrillator: Principal | ICD-10-CM

## 2020-05-04 ENCOUNTER — Encounter: Admit: 2020-05-04 | Discharge: 2020-05-05 | Payer: MEDICARE

## 2020-05-04 DIAGNOSIS — Z4502 Encounter for adjustment and management of automatic implantable cardiac defibrillator: Secondary | ICD-10-CM | POA: Diagnosis not present

## 2020-05-04 DIAGNOSIS — I429 Cardiomyopathy, unspecified: Secondary | ICD-10-CM | POA: Diagnosis not present

## 2020-06-01 NOTE — Progress Notes (Signed)
Trena Platt Cummings,acting as a scribe for Wilhemena Durie, MD.,have documented all relevant documentation on the behalf of Wilhemena Durie, MD,as directed by  Wilhemena Durie, MD while in the presence of Wilhemena Durie, MD.  Annual Wellness Visit     Patient: Anna Marsh, Female    DOB: 24-Aug-1946, 74 y.o.   MRN: [id number] Visit Date: 06/03/2020  Today's Provider: Wilhemena Durie, MD   Chief Complaint  Patient presents with  . Follow-up   Subjective    Anna Marsh is a 74 y.o. female who presents today for her Annual Wellness Visit.Annual Physical. She reports consuming a low sodium diet. Home exercise routine includes walking. She generally feels fairly well. She reports sleeping fairly well. She does not have additional problems to discuss today.   HPI Patient is no longer on Entresto but has been doing very well and is clinically stable. She is status post hysterectomy.  Social History   Tobacco Use  . Smoking status: Former Smoker    Packs/day: 0.50    Years: 20.00    Pack years: 10.00    Quit date: 10/03/2003    Years since quitting: 16.6  . Smokeless tobacco: Never Used  Vaping Use  . Vaping Use: Never used  Substance Use Topics  . Alcohol use: No  . Drug use: No       Medications: Outpatient Medications Prior to Visit  Medication Sig  . atorvastatin (LIPITOR) 80 MG tablet TAKE 1 TABLET BY MOUTH  DAILY  . butalbital-acetaminophen-caffeine (FIORICET) 50-325-40 MG tablet Take 1 tablet by mouth every 4 (four) hours as needed for headache.  . clopidogrel (PLAVIX) 75 MG tablet   . cyanocobalamin 1000 MCG tablet Take 1,000 mcg by mouth daily.   Marland Kitchen ENTRESTO 24-26 MG   . furosemide (LASIX) 20 MG tablet TAKE 1 TABLET BY MOUTH  DAILY AS NEEDED  . gabapentin (NEURONTIN) 100 MG capsule Take 1 capsule (100 mg total) by mouth at bedtime.  Marland Kitchen loratadine (CLARITIN) 10 MG tablet Take 10 mg by mouth daily.   . pantoprazole (PROTONIX) 40 MG tablet  TAKE 1 TABLET BY MOUTH  EVERY MORNING  . potassium chloride (KLOR-CON) 10 MEQ tablet TAKE 1 TABLET BY MOUTH  TWICE DAILY  . promethazine (PHENERGAN) 25 MG tablet Take 1/2-1 tablet by mouth every 8 hours as needed for nausea.  . butalbital-acetaminophen-caffeine (FIORICET) 50-325-40 MG tablet Take 1-2 tablets by mouth every 6 (six) hours as needed for headache. (Patient not taking: Reported on 08/05/2019)  . butalbital-acetaminophen-caffeine (FIORICET) 50-325-40 MG tablet Take 1 tablet by mouth every 6 (six) hours as needed for headache. (Patient not taking: Reported on 08/05/2019)  . carvedilol (COREG) 6.25 MG tablet Take by mouth.  . fluticasone (FLONASE) 50 MCG/ACT nasal spray Place 2 sprays into both nostrils daily. (Patient not taking: Reported on 05/05/2019)  . Multiple Vitamins-Minerals (MULTIVITAMIN WITH MINERALS) tablet Take 1 tablet by mouth daily. (Patient not taking: Reported on 06/03/2020)  . nitroGLYCERIN (NITROSTAT) 0.4 MG SL tablet Place 1 tablet (0.4 mg total) under the tongue every 5 (five) minutes as needed for chest pain.  . sacubitril-valsartan (ENTRESTO) 49-51 MG Take 1 tablet by mouth 2 (two) times daily. (Patient not taking: Reported on 06/03/2020)  . sertraline (ZOLOFT) 50 MG tablet TAKE 1 TABLET BY MOUTH  DAILY (Patient not taking: Reported on 06/03/2020)   No facility-administered medications prior to visit.    Allergies  Allergen Reactions  . Iodinated Diagnostic Agents Other (  See Comments)    Causes migraines  . Iodine     Contrast Dye causes migraines  . Lisinopril Cough  . Penicillins Rash    Has patient had a PCN reaction causing immediate rash, facial/tongue/throat swelling, SOB or lightheadedness with hypotension: Yes Has patient had a PCN reaction causing severe rash involving mucus membranes or skin necrosis: No Has patient had a PCN reaction that required hospitalization: No Has patient had a PCN reaction occurring within the last 10 years: Yes If all of the above  answers are "NO", then may proceed with Cephalosporin use.    Patient Care Team: Jerrol Banana., MD as PCP - General (Family Medicine)  Review of Systems  Constitutional: Negative.   HENT: Negative.   Eyes: Negative.   Respiratory: Negative.   Cardiovascular: Negative.   Gastrointestinal: Negative.   Endocrine: Negative.   Genitourinary: Negative.   Musculoskeletal: Negative.   Skin: Negative.   Allergic/Immunologic: Negative.   Neurological: Negative.   Hematological: Negative.   Psychiatric/Behavioral: Negative.        Objective    Vitals: BP 115/74 (BP Location: Right Arm, Patient Position: Sitting, Cuff Size: Large)   Pulse 70   Temp 98.2 F (36.8 C) (Oral)   Ht 5\' 3"  (1.6 m)   Wt 274 lb 9.6 oz (124.6 kg)   BMI 48.64 kg/m  BP Readings from Last 3 Encounters:  06/03/20 115/74  02/02/20 (!) 97/58  08/05/19 133/69   Wt Readings from Last 3 Encounters:  06/03/20 274 lb 9.6 oz (124.6 kg)  02/02/20 268 lb 3.2 oz (121.7 kg)  08/05/19 263 lb (119.3 kg)      Physical Exam Vitals reviewed.  Constitutional:      Appearance: Normal appearance.  HENT:     Head: Normocephalic and atraumatic.     Right Ear: External ear normal.     Left Ear: External ear normal.  Eyes:     General: No scleral icterus.    Conjunctiva/sclera: Conjunctivae normal.  Cardiovascular:     Rate and Rhythm: Normal rate and regular rhythm.     Pulses: Normal pulses.     Heart sounds: Normal heart sounds.  Pulmonary:     Effort: Pulmonary effort is normal.     Breath sounds: Normal breath sounds.  Abdominal:     Palpations: Abdomen is soft.  Musculoskeletal:     Right lower leg: No edema.     Left lower leg: No edema.  Skin:    General: Skin is warm and dry.  Neurological:     General: No focal deficit present.     Mental Status: She is alert and oriented to person, place, and time.  Psychiatric:        Mood and Affect: Mood normal.        Behavior: Behavior normal.         Thought Content: Thought content normal.        Judgment: Judgment normal.      Most recent functional status assessment: No flowsheet data found. Most recent fall risk assessment: Fall Risk  08/05/2019  Falls in the past year? 0  Number falls in past yr: 0  Injury with Fall? 0  Follow up Falls evaluation completed    Most recent depression screenings: PHQ 2/9 Scores 03/04/2019 07/30/2017  PHQ - 2 Score 0 0   Most recent cognitive screening: No flowsheet data found. Most recent Audit-C alcohol use screening No flowsheet data found. A score of 3 or  more in women, and 4 or more in men indicates increased risk for alcohol abuse, EXCEPT if all of the points are from question 1   No results found for any visits on 06/03/20.  Assessment & Plan     Annual wellness visit done today including the all of the following: Reviewed patient's Family Medical History Reviewed and updated list of patient's medical providers Assessment of cognitive impairment was done Assessed patient's functional ability Established a written schedule for health screening [place] Completed and Reviewed  Exercise Activities and Dietary recommendations Goals   None     Immunization History  Administered Date(s) Administered  . Fluad Quad(high Dose 65+) 08/05/2019  . Influenza, High Dose Seasonal PF 06/24/2015, 07/24/2016, 06/28/2017, 06/25/2018  . PFIZER SARS-COV-2 Vaccination 11/24/2019, 12/16/2019  . Pneumococcal Conjugate-13 08/29/2016  . Pneumococcal Polysaccharide-23 09/14/2017    Health Maintenance  Topic Date Due  . Hepatitis C Screening  Never done  . FOOT EXAM  Never done  . URINE MICROALBUMIN  Never done  . TETANUS/TDAP  Never done  . OPHTHALMOLOGY EXAM  05/30/2017  . HEMOGLOBIN A1C  09/10/2019  . INFLUENZA VACCINE  05/02/2020  . MAMMOGRAM  03/16/2022  . COLONOSCOPY  02/24/2030  . DEXA SCAN  Completed  . COVID-19 Vaccine  Completed  . PNA vac Low Risk Adult   Completed     Discussed health benefits of physical activity, and encouraged her to engage in regular exercise appropriate for her age and condition.    1. Medicare annual wellness visit, subsequent    2. Annual physical exam Patient status post hysterectomy.  Up-to-date on colonoscopy and mammogram in 2021.  3. Hypercholesterolemia  - CBC with Differential/Platelet - Comprehensive metabolic panel - TSH - Lipid panel  4. Chronic systolic CHF (congestive heart failure), NYHA class 3 (HCC) Clinically stable - CBC with Differential/Platelet - Comprehensive metabolic panel - TSH - Lipid panel  5. Class 3 severe obesity due to excess calories with serious comorbidity and body mass index (BMI) of 45.0 to 49.9 in adult (HCC)  - CBC with Differential/Platelet - Comprehensive metabolic panel - TSH - Lipid panel  6. Essential hypertension  - CBC with Differential/Platelet - Comprehensive metabolic panel - TSH - Lipid panel   No follow-ups on file.     I, Wilhemena Durie, MD, have reviewed all documentation for this visit. The documentation on 06/16/20 for the exam, diagnosis, procedures, and orders are all accurate and complete.    Idelle Reimann Cranford Mon, MD  [facility] [phone] (phone) [phone] (fax)  [place]

## 2020-06-03 ENCOUNTER — Encounter: Payer: Self-pay | Admitting: Family Medicine

## 2020-06-03 ENCOUNTER — Other Ambulatory Visit: Payer: Self-pay

## 2020-06-03 ENCOUNTER — Ambulatory Visit (INDEPENDENT_AMBULATORY_CARE_PROVIDER_SITE_OTHER): Payer: Medicare Other | Admitting: Family Medicine

## 2020-06-03 VITALS — BP 115/74 | HR 70 | Temp 98.2°F | Ht 63.0 in | Wt 274.6 lb

## 2020-06-03 DIAGNOSIS — I5022 Chronic systolic (congestive) heart failure: Secondary | ICD-10-CM

## 2020-06-03 DIAGNOSIS — Z0000 Encounter for general adult medical examination without abnormal findings: Secondary | ICD-10-CM

## 2020-06-03 DIAGNOSIS — Z6841 Body Mass Index (BMI) 40.0 and over, adult: Secondary | ICD-10-CM

## 2020-06-03 DIAGNOSIS — I10 Essential (primary) hypertension: Secondary | ICD-10-CM | POA: Diagnosis not present

## 2020-06-03 DIAGNOSIS — E7800 Pure hypercholesterolemia, unspecified: Secondary | ICD-10-CM

## 2020-07-06 ENCOUNTER — Other Ambulatory Visit: Payer: Self-pay | Admitting: Family Medicine

## 2020-07-06 DIAGNOSIS — E7800 Pure hypercholesterolemia, unspecified: Secondary | ICD-10-CM

## 2020-07-06 NOTE — Telephone Encounter (Signed)
Requested Prescriptions  Pending Prescriptions Disp Refills  . pantoprazole (PROTONIX) 40 MG tablet [Pharmacy Med Name: Pantoprazole Sodium 40 MG Oral Tablet Delayed Release] 90 tablet 3    Sig: TAKE 1 TABLET BY MOUTH IN  THE MORNING     Gastroenterology: Proton Pump Inhibitors Passed - 07/06/2020  5:43 AM      Passed - Valid encounter within last 12 months    Recent Outpatient Visits          1 month ago Medicare annual wellness visit, subsequent   [facility] Jerrol Banana., MD   5 months ago Essential hypertension   [facility] Jerrol Banana., MD   11 months ago Chronic systolic CHF (congestive heart failure), NYHA class 3 [facility])   [facility] Jerrol Banana., MD   1 year ago Chronic systolic CHF (congestive heart failure), NYHA class 3 [facility])   [facility] Jerrol Banana., MD   1 year ago Cerebrovascular accident (CVA) due to bilateral stenosis of carotid arteries [facility])   [facility] Jerrol Banana., MD      Future Appointments            In 4 months Jerrol Banana., MD [facility]           . atorvastatin (LIPITOR) 80 MG tablet [Pharmacy Med Name: Atorvastatin Calcium 80 MG Oral Tablet] 90 tablet 3    Sig: TAKE 1 TABLET BY MOUTH  DAILY     Cardiovascular:  Antilipid - Statins Failed - 07/06/2020  5:43 AM      Failed - LDL in normal range and within 360 days    LDL Chol Calc (NIH)  Date Value Ref Range Status  09/05/2019 105 (H) 0 - 99 mg/dL Final         Passed - Total Cholesterol in normal range and within 360 days    Cholesterol, Total  Date Value Ref Range Status  09/05/2019 179 100 - 199 mg/dL Final         Passed - HDL in normal range and within 360 days    HDL  Date Value Ref Range Status  09/05/2019 57 >39 mg/dL Final         Passed - Triglycerides in normal range and within 360 days    Triglycerides  Date Value Ref  Range Status  09/05/2019 91 0 - 149 mg/dL Final         Passed - Patient is not pregnant      Passed - Valid encounter within last 12 months    Recent Outpatient Visits          1 month ago Medicare annual wellness visit, subsequent   [facility] Jerrol Banana., MD   5 months ago Essential hypertension   [facility] Jerrol Banana., MD   11 months ago Chronic systolic CHF (congestive heart failure), NYHA class 3 [facility])   [facility] Jerrol Banana., MD   1 year ago Chronic systolic CHF (congestive heart failure), NYHA class 3 [facility])   [facility] Jerrol Banana., MD   1 year ago Cerebrovascular accident (CVA) due to bilateral stenosis of carotid arteries [facility])   [facility] Jerrol Banana., MD      Future Appointments            In 4 months Miguel Aschoff  Kaylyn Lim., MD [facility], [place]

## 2020-07-08 DIAGNOSIS — H35371 Puckering of macula, right eye: Secondary | ICD-10-CM | POA: Diagnosis not present

## 2020-07-08 LAB — HM DIABETES EYE EXAM

## 2020-07-14 ENCOUNTER — Other Ambulatory Visit: Payer: Self-pay | Admitting: Family Medicine

## 2020-07-20 ENCOUNTER — Other Ambulatory Visit: Payer: Self-pay | Admitting: Family Medicine

## 2020-07-29 ENCOUNTER — Ambulatory Visit
Admit: 2020-07-29 | Discharge: 2020-07-30 | Payer: MEDICARE | Attending: Cardiovascular Disease | Primary: Cardiovascular Disease

## 2020-07-29 DIAGNOSIS — E782 Mixed hyperlipidemia: Principal | ICD-10-CM

## 2020-07-29 DIAGNOSIS — I429 Cardiomyopathy, unspecified: Secondary | ICD-10-CM | POA: Diagnosis not present

## 2020-07-29 DIAGNOSIS — I10 Essential (primary) hypertension: Secondary | ICD-10-CM | POA: Diagnosis not present

## 2020-07-29 MED ORDER — EZETIMIBE 10 MG TABLET
ORAL_TABLET | Freq: Every day | ORAL | 3 refills | 90.00000 days | Status: CP
Start: 2020-07-29 — End: 2021-07-29

## 2020-08-03 ENCOUNTER — Encounter: Admit: 2020-08-03 | Discharge: 2020-08-04 | Payer: MEDICARE

## 2020-08-09 ENCOUNTER — Ambulatory Visit: Payer: Self-pay

## 2020-08-09 NOTE — Telephone Encounter (Signed)
Patient daughter called stating that her mother got up today and started to have blurred vision.  She called from work at lunch time and states that her mother was confused.  She is now with her mother who she states was confused about events that happened today.  She has taken medication for possible migraine but never had a headache. Per protocol patient will be taken to ER for evaluation. Care advice read to daughter.  She verbalized understanding.   Reason for Disposition . Headache  (and neurologic deficit)  Answer Assessment - Initial Assessment Questions 1. SYMPTOM: "What is the main symptom you are concerned about?" (e.g., weakness, numbness)     slurred 2. ONSET: "When did this start?" (minutes, hours, days; while sleeping)    today 3. LAST NORMAL: "When was the last time you were normal (no symptoms)?"     This AM 4. PATTERN "Does this come and go, or has it been constant since it started?"  "Is it present now?"    fine 5. CARDIAC SYMPTOMS: "Have you had any of the following symptoms: chest pain, difficulty breathing, palpitations?"    no 6. NEUROLOGIC SYMPTOMS: "Have you had any of the following symptoms: headache, dizziness, vision loss, double vision, changes in speech, unsteady on your feet?"     Confusion  7. OTHER SYMPTOMS: "Do you have any other symptoms?"     no 8. PREGNANCY: "Is there any chance you are pregnant?" "When was your last menstrual period?"    N/A  Protocols used: NEUROLOGIC DEFICIT-A-AH

## 2020-08-18 DIAGNOSIS — I10 Essential (primary) hypertension: Secondary | ICD-10-CM | POA: Diagnosis not present

## 2020-08-18 DIAGNOSIS — E7800 Pure hypercholesterolemia, unspecified: Secondary | ICD-10-CM | POA: Diagnosis not present

## 2020-08-18 DIAGNOSIS — I5022 Chronic systolic (congestive) heart failure: Secondary | ICD-10-CM | POA: Diagnosis not present

## 2020-08-19 LAB — COMPREHENSIVE METABOLIC PANEL
ALT: 9 IU/L (ref 0–32)
AST: 13 IU/L (ref 0–40)
Albumin/Globulin Ratio: 1.6 (ref 1.2–2.2)
Albumin: 3.9 g/dL (ref 3.7–4.7)
Alkaline Phosphatase: 129 IU/L — ABNORMAL HIGH (ref 44–121)
BUN/Creatinine Ratio: 20 (ref 12–28)
BUN: 22 mg/dL (ref 8–27)
Bilirubin Total: 0.4 mg/dL (ref 0.0–1.2)
CO2: 25 mmol/L (ref 20–29)
Calcium: 9.9 mg/dL (ref 8.7–10.3)
Chloride: 104 mmol/L (ref 96–106)
Creatinine, Ser: 1.09 mg/dL — ABNORMAL HIGH (ref 0.57–1.00)
GFR calc Af Amer: 58 mL/min/{1.73_m2} — ABNORMAL LOW (ref >59)
GFR calc non Af Amer: 50 mL/min/{1.73_m2} — ABNORMAL LOW (ref >59)
Globulin, Total: 2.5 g/dL (ref 1.5–4.5)
Glucose: 101 mg/dL — ABNORMAL HIGH (ref 65–99)
Potassium: 4.7 mmol/L (ref 3.5–5.2)
Sodium: 141 mmol/L (ref 134–144)
Total Protein: 6.4 g/dL (ref 6.0–8.5)

## 2020-08-19 LAB — CBC WITH DIFFERENTIAL/PLATELET
Basophils Absolute: 0 10*3/uL (ref 0.0–0.2)
Basos: 1 %
EOS (ABSOLUTE): 0.2 10*3/uL (ref 0.0–0.4)
Eos: 3 %
Hematocrit: 37.9 % (ref 34.0–46.6)
Hemoglobin: 12 g/dL (ref 11.1–15.9)
Immature Grans (Abs): 0 10*3/uL (ref 0.0–0.1)
Immature Granulocytes: 0 %
Lymphocytes Absolute: 1.2 10*3/uL (ref 0.7–3.1)
Lymphs: 20 %
MCH: 27 pg (ref 26.6–33.0)
MCHC: 31.7 g/dL (ref 31.5–35.7)
MCV: 85 fL (ref 79–97)
Monocytes Absolute: 0.4 10*3/uL (ref 0.1–0.9)
Monocytes: 6 %
Neutrophils Absolute: 4 10*3/uL (ref 1.4–7.0)
Neutrophils: 70 %
Platelets: 228 10*3/uL (ref 150–450)
RBC: 4.44 x10E6/uL (ref 3.77–5.28)
RDW: 12.6 % (ref 11.7–15.4)
WBC: 5.8 10*3/uL (ref 3.4–10.8)

## 2020-08-19 LAB — LIPID PANEL
Chol/HDL Ratio: 2.9 ratio (ref 0.0–4.4)
Cholesterol, Total: 179 mg/dL (ref 100–199)
HDL: 62 mg/dL (ref >39)
LDL Chol Calc (NIH): 99 mg/dL (ref 0–99)
Triglycerides: 97 mg/dL (ref 0–149)
VLDL Cholesterol Cal: 18 mg/dL (ref 5–40)

## 2020-08-19 LAB — TSH: TSH: 2.38 u[IU]/mL (ref 0.450–4.500)

## 2020-08-23 ENCOUNTER — Ambulatory Visit: Payer: Medicare Other | Attending: Internal Medicine

## 2020-08-23 DIAGNOSIS — Z23 Encounter for immunization: Secondary | ICD-10-CM

## 2020-08-23 NOTE — Progress Notes (Signed)
   Covid-19 Vaccination Clinic  Name:  Floetta Brickey    MRN: [id number] DOB: 10-30-1945  08/23/2020  Ms. Bouie was observed post Covid-19 immunization for 15 minutes without incident. She was provided with Vaccine Information Sheet and instruction to access the V-Safe system.   Ms. Dermody was instructed to call 911 with any severe reactions post vaccine: Marland Kitchen Difficulty breathing  . Swelling of face and throat  . A fast heartbeat  . A bad rash all over body  . Dizziness and weakness   Immunizations Administered    Name Date Dose VIS Date Route   Pfizer COVID-19 Vaccine 08/23/2020  2:28 PM 0.3 mL 07/21/2020 Intramuscular   Manufacturer: [place]   Lot: [id number]   [place]: [id number]

## 2020-09-20 ENCOUNTER — Other Ambulatory Visit: Payer: Self-pay | Admitting: Family Medicine

## 2020-09-20 DIAGNOSIS — E7800 Pure hypercholesterolemia, unspecified: Secondary | ICD-10-CM

## 2020-09-20 MED ORDER — LOSARTAN 50 MG TABLET
ORAL_TABLET | 3 refills | 0 days | Status: CP
Start: 2020-09-20 — End: ?

## 2020-09-21 NOTE — Progress Notes (Signed)
Established patient visit   Patient: Anna Marsh   DOB: [birth year]   74 y.o. Female  MRN: [id number] Visit Date: 09/22/2020  Today's healthcare provider: Wilhemena Durie, MD   Chief Complaint  Patient presents with  . Hand Pain   Subjective    HPI  Patient here today C/O of lump on top of right hand x two weeks. Patient denies any injuries. Patient reports mild pain.   Patient Active Problem List   Diagnosis Date Noted  . Paroxysmal atrial fibrillation ([place]) 07/26/2018  . Right cavernous carotid stenosis 07/16/2018  . Ischemic stroke ([place]) 07/16/2018  . TIA (transient ischemic attack) 11/07/2016  . Pre-diabetes 07/20/2015  . Acute anxiety 07/20/2015  . 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[place]) 02/28/2015  . GERD (gastroesophageal reflux disease) 02/28/2015  . Hypercholesterolemia 02/28/2015  . Hypertension 02/28/2015  . Cardiac defibrillator in place 02/28/2015  . Block, bundle branch, left 02/28/2015  . Adiposity 02/28/2015  . Acid reflux 02/28/2015  . Complete atrioventricular block ([place]) 02/28/2015  . Chronic systolic CHF (congestive heart failure), NYHA class 3 ([place]) 02/24/2015  . H/O cardiac catheterization 02/24/2015  . 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 Cardiomyopathy ([place]) 02/01/2015  . Angina pectoris ([place]) 12/25/2013  . Arthritis 12/25/2013  . Hallux abducto valgus 12/25/2013   Social History   Tobacco Use  . Smoking status: Former Smoker    Packs/day: 0.50    Years: 20.00    Pack years: 10.00    Quit date: 10/03/2003    Years since quitting: 16.9  . Smokeless tobacco: Never Used  Vaping Use  . Vaping Use: Never used  Substance Use Topics  . Alcohol use: No  . Drug use: No   Allergies  Allergen Reactions  . Iodinated Diagnostic Agents Other (See Comments)    Causes migraines  . Iodine     Contrast Dye causes migraines  . Lisinopril Cough  . Penicillins Rash    Has patient had a PCN reaction causing  immediate rash, facial/tongue/throat swelling, SOB or lightheadedness with hypotension: Yes Has patient had a PCN reaction causing severe rash involving mucus membranes or skin necrosis: No Has patient had a PCN reaction that required hospitalization: No Has patient had a PCN reaction occurring within the last 10 years: Yes If all of the above answers are "NO", then may proceed with Cephalosporin use.       Medications: Outpatient Medications Prior to Visit  Medication Sig  . atorvastatin (LIPITOR) 80 MG tablet TAKE 1 TABLET BY MOUTH  DAILY  . butalbital-acetaminophen-caffeine (FIORICET) 50-325-40 MG tablet Take 1 tablet by mouth every 4 (four) hours as needed for headache.  . clopidogrel (PLAVIX) 75 MG tablet   . cyanocobalamin 1000 MCG tablet Take 1,000 mcg by mouth daily.   Marland Kitchen ENTRESTO 24-26 MG   . furosemide (LASIX) 20 MG tablet TAKE 1 TABLET BY MOUTH  DAILY AS NEEDED  . gabapentin (NEURONTIN) 100 MG capsule TAKE 1 CAPSULE BY MOUTH AT  BEDTIME  . loratadine (CLARITIN) 10 MG tablet Take 10 mg by mouth daily.   . pantoprazole (PROTONIX) 40 MG tablet TAKE 1 TABLET BY MOUTH IN  THE MORNING  . potassium chloride (KLOR-CON) 10 MEQ tablet TAKE 1 TABLET BY MOUTH  TWICE DAILY  . promethazine (PHENERGAN) 25 MG tablet Take 1/2-1 tablet by mouth every 8 hours as needed for nausea.  . carvedilol (COREG) 6.25 MG tablet Take by mouth.  . nitroGLYCERIN (NITROSTAT) 0.4 MG SL  tablet Place 1 tablet (0.4 mg total) under the tongue every 5 (five) minutes as needed for chest pain.  . [DISCONTINUED] butalbital-acetaminophen-caffeine (FIORICET) 50-325-40 MG tablet Take 1 tablet by mouth every 6 (six) hours as needed for headache. (Patient not taking: No sig reported)  . [DISCONTINUED] fluticasone (FLONASE) 50 MCG/ACT nasal spray Place 2 sprays into both nostrils daily. (Patient not taking: No sig reported)  . [DISCONTINUED] Multiple Vitamins-Minerals (MULTIVITAMIN WITH MINERALS) tablet Take 1 tablet by mouth  daily. (Patient not taking: No sig reported)  . [DISCONTINUED] sacubitril-valsartan (ENTRESTO) 49-51 MG Take 1 tablet by mouth 2 (two) times daily. (Patient not taking: No sig reported)  . [DISCONTINUED] sertraline (ZOLOFT) 50 MG tablet TAKE 1 TABLET BY MOUTH  DAILY (Patient not taking: No sig reported)   No facility-administered medications prior to visit.    Review of Systems  Constitutional: Negative for appetite change, chills, fatigue and fever.  Respiratory: Negative for chest tightness and shortness of breath.   Cardiovascular: Negative for chest pain and palpitations.  Gastrointestinal: Negative for abdominal pain, nausea and vomiting.  Neurological: Negative for dizziness and weakness.    Last CBC Lab Results  Component Value Date   WBC 5.8 08/18/2020   HGB 12.0 08/18/2020   HCT 37.9 08/18/2020   MCV 85 08/18/2020   MCH 27.0 08/18/2020   RDW 12.6 08/18/2020   PLT 228 08/18/2020      Objective    BP 118/70 (BP Location: Left Arm, Patient Position: Sitting, Cuff Size: Normal)   Pulse 67   Temp 98.3 F (36.8 C) (Oral)   Resp 16   Wt 279 lb 9.6 oz (126.8 kg)   SpO2 99%   BMI 49.53 kg/m  BP Readings from Last 3 Encounters:  09/22/20 118/70  06/03/20 115/74  02/02/20 (!) 97/58   Wt Readings from Last 3 Encounters:  09/22/20 279 lb 9.6 oz (126.8 kg)  06/03/20 274 lb 9.6 oz (124.6 kg)  02/02/20 268 lb 3.2 oz (121.7 kg)      Physical Exam Vitals reviewed.  Constitutional:      Appearance: Normal appearance.  Eyes:     General: No scleral icterus.    Conjunctiva/sclera: Conjunctivae normal.  Neck:     Vascular: No carotid bruit.  Cardiovascular:     Rate and Rhythm: Normal rate and regular rhythm.     Pulses: Normal pulses.     Heart sounds: Normal heart sounds.  Pulmonary:     Effort: Pulmonary effort is normal.     Breath sounds: Normal breath sounds.  Musculoskeletal:     Right lower leg: No edema.     Left lower leg: No edema.     Comments:  Probable ganglion cyst on the dorsum of her right hand.  Skin:    General: Skin is warm and dry.  Neurological:     Mental Status: She is alert and oriented to person, place, and time. Mental status is at baseline.  Psychiatric:        Mood and Affect: Mood normal.        Behavior: Behavior normal.        Thought Content: Thought content normal.        Judgment: Judgment normal.       No results found for any visits on 09/22/20.  Assessment & Plan     1. Need for immunization against influenza  - Flu Vaccine QUAD High Dose(Fluad)  2. Ganglion of right wrist Will refer to hand surgeon if  necessary.  Recommend splint for the time being.  No further intervention at this time.  3. Chronic systolic CHF (congestive heart failure), NYHA class 3 (HCC) Clinically stable  4. Class 3 severe obesity due to excess calories with serious comorbidity and body mass index (BMI) of 45.0 to 49.9 in adult ([place])   5. Hypercholesterolemia   6. Essential hypertension    No follow-ups on file.         Russell Quinney Cranford Mon, MD  [facility] [phone] (phone) [phone] (fax)  [place]

## 2020-09-22 ENCOUNTER — Other Ambulatory Visit: Payer: Self-pay

## 2020-09-22 ENCOUNTER — Ambulatory Visit (INDEPENDENT_AMBULATORY_CARE_PROVIDER_SITE_OTHER): Payer: Medicare Other | Admitting: Family Medicine

## 2020-09-22 ENCOUNTER — Encounter: Payer: Self-pay | Admitting: Family Medicine

## 2020-09-22 VITALS — BP 118/70 | HR 67 | Temp 98.3°F | Resp 16 | Wt 279.6 lb

## 2020-09-22 DIAGNOSIS — Z23 Encounter for immunization: Secondary | ICD-10-CM

## 2020-09-22 DIAGNOSIS — Z6841 Body Mass Index (BMI) 40.0 and over, adult: Secondary | ICD-10-CM

## 2020-09-22 DIAGNOSIS — M67431 Ganglion, right wrist: Secondary | ICD-10-CM

## 2020-09-22 DIAGNOSIS — I5022 Chronic systolic (congestive) heart failure: Secondary | ICD-10-CM | POA: Diagnosis not present

## 2020-09-22 DIAGNOSIS — I10 Essential (primary) hypertension: Secondary | ICD-10-CM

## 2020-09-22 DIAGNOSIS — E7800 Pure hypercholesterolemia, unspecified: Secondary | ICD-10-CM

## 2020-10-28 DIAGNOSIS — I6521 Occlusion and stenosis of right carotid artery: Principal | ICD-10-CM

## 2020-10-28 MED ORDER — CLOPIDOGREL 75 MG TABLET
ORAL_TABLET | Freq: Every day | ORAL | 3 refills | 90 days | Status: CP
Start: 2020-10-28 — End: 2021-10-28

## 2020-11-02 ENCOUNTER — Encounter: Admit: 2020-11-02 | Discharge: 2020-11-03 | Payer: MEDICARE

## 2020-11-02 DIAGNOSIS — I442 Atrioventricular block, complete: Principal | ICD-10-CM

## 2020-11-02 DIAGNOSIS — Z4502 Encounter for adjustment and management of automatic implantable cardiac defibrillator: Principal | ICD-10-CM

## 2020-11-04 ENCOUNTER — Other Ambulatory Visit: Payer: Self-pay

## 2020-11-04 ENCOUNTER — Other Ambulatory Visit: Payer: Self-pay | Admitting: Family Medicine

## 2020-11-04 ENCOUNTER — Telehealth: Payer: Self-pay

## 2020-11-04 MED ORDER — CLOPIDOGREL 75 MG TABLET
ORAL_TABLET | Freq: Every day | ORAL | 3 refills | 90.00000 days | Status: CP
Start: 2020-11-04 — End: 2021-11-04

## 2020-11-04 MED ORDER — GABAPENTIN 100 MG PO CAPS: 200.0000 mg | ORAL_CAPSULE | Freq: Every day | ORAL | 1 refills | Status: DC

## 2020-11-04 NOTE — Telephone Encounter (Signed)
Okay to increase to 2 tablets daily.  Thank you

## 2020-11-04 NOTE — Telephone Encounter (Signed)
Medication increased and sent into the pharmacy.

## 2020-11-04 NOTE — Telephone Encounter (Signed)
Increased gabapentin 100mg  to 2 capsules daily. Ok per Dr. Rosanna Randy.

## 2020-11-04 NOTE — Telephone Encounter (Signed)
Copied from [place] [id number]. Topic: General - Other >> Nov 04, 2020  8:02 AM Hinda Lenis D wrote: PT requesting a callback to go over this medication gabapentin (NEURONTIN) 100 MG capsule [[id number]] she wants to take 2 tablets a day / please advise

## 2020-11-04 NOTE — Telephone Encounter (Signed)
Patient reports that she is having increased pain in her neck, and the gabapentin helps with this. She is currently taking 1 capsule (100mg ) daily. She is wanting to increase medication to 2 capsules daily. Ok to send into the pharmacy? Please advise. Thanks!

## 2020-11-07 ENCOUNTER — Other Ambulatory Visit: Payer: Self-pay | Admitting: Family Medicine

## 2020-12-01 ENCOUNTER — Ambulatory Visit (INDEPENDENT_AMBULATORY_CARE_PROVIDER_SITE_OTHER): Payer: Medicare Other | Admitting: Family Medicine

## 2020-12-01 ENCOUNTER — Other Ambulatory Visit: Payer: Self-pay

## 2020-12-01 ENCOUNTER — Encounter: Payer: Self-pay | Admitting: Family Medicine

## 2020-12-01 VITALS — BP 144/67 | HR 70 | Temp 98.3°F | Resp 20 | Ht 63.0 in | Wt 269.0 lb

## 2020-12-01 DIAGNOSIS — E7800 Pure hypercholesterolemia, unspecified: Secondary | ICD-10-CM

## 2020-12-01 DIAGNOSIS — I5022 Chronic systolic (congestive) heart failure: Secondary | ICD-10-CM | POA: Diagnosis not present

## 2020-12-01 DIAGNOSIS — Z6841 Body Mass Index (BMI) 40.0 and over, adult: Secondary | ICD-10-CM

## 2020-12-01 DIAGNOSIS — I255 Ischemic cardiomyopathy: Secondary | ICD-10-CM

## 2020-12-01 DIAGNOSIS — M1990 Unspecified osteoarthritis, unspecified site: Secondary | ICD-10-CM | POA: Diagnosis not present

## 2020-12-01 NOTE — Progress Notes (Signed)
Established patient visit   Patient: Anna Marsh   DOB: 27-Feb-1946   75 y.o. Female  MRN: [id number] Visit Date: 12/01/2020  Today's healthcare provider: Wilhemena Durie, MD   Chief Complaint  Patient presents with  . Hypertension  . Hyperlipidemia   Subjective    HPI  Patient comes in today for follow-up of chronic medical problems.  She is feeling well and has no problems. She has had no falls and is breathing well. Hypertension, follow-up  BP Readings from Last 3 Encounters:  12/01/20 (!) 144/67  09/22/20 118/70  06/03/20 115/74   Wt Readings from Last 3 Encounters:  12/01/20 269 lb (122 kg)  09/22/20 279 lb 9.6 oz (126.8 kg)  06/03/20 274 lb 9.6 oz (124.6 kg)     She was last seen for hypertension 6 months ago.  BP at that visit was 115/74. Management since that visit includes continuing current medications.  She reports good compliance with treatment. She is not having side effects.  She is following a Regular diet. She is not exercising. She does not smoke.  Use of agents associated with hypertension: none.   Outside blood pressures are checked daily. Patient reports that it averages in the 130s/70s.  Symptoms: No chest pain No chest pressure  No palpitations No syncope  No dyspnea No orthopnea  No paroxysmal nocturnal dyspnea No lower extremity edema   Pertinent labs: Lab Results  Component Value Date   CHOL 179 08/18/2020   HDL 62 08/18/2020   LDLCALC 99 08/18/2020   TRIG 97 08/18/2020   CHOLHDL 2.9 08/18/2020   Lab Results  Component Value Date   NA 141 08/18/2020   K 4.7 08/18/2020   CREATININE 1.09 (H) 08/18/2020   GFRNONAA 50 (L) 08/18/2020   GFRAA 58 (L) 08/18/2020   GLUCOSE 101 (H) 08/18/2020     The ASCVD Risk score (Goff DC Jr., et al., 2013) failed to calculate for the following reasons:   The patient has a prior MI or stroke diagnosis    --------------------------------------------------------------------------------------------------- Lipid/Cholesterol, Follow-up  Last lipid panel Other pertinent labs  Lab Results  Component Value Date   CHOL 179 08/18/2020   HDL 62 08/18/2020   LDLCALC 99 08/18/2020   TRIG 97 08/18/2020   CHOLHDL 2.9 08/18/2020   Lab Results  Component Value Date   ALT 9 08/18/2020   AST 13 08/18/2020   PLT 228 08/18/2020   TSH 2.380 08/18/2020     She was last seen for this 6 months ago.  Management since that visit includes no medication changes.  She reports good compliance with treatment. She is not having side effects.       Medications: Outpatient Medications Prior to Visit  Medication Sig  . atorvastatin (LIPITOR) 80 MG tablet TAKE 1 TABLET BY MOUTH  DAILY  . butalbital-acetaminophen-caffeine (FIORICET) 50-325-40 MG tablet Take 1 tablet by mouth every 4 (four) hours as needed for headache.  . clopidogrel (PLAVIX) 75 MG tablet   . carvedilol (COREG) 6.25 MG tablet Take by mouth.  . cyanocobalamin 1000 MCG tablet Take 1,000 mcg by mouth daily.   Marland Kitchen ENTRESTO 24-26 MG  (Patient not taking: Reported on 12/01/2020)  . furosemide (LASIX) 20 MG tablet TAKE 1 TABLET BY MOUTH  DAILY AS NEEDED  . gabapentin (NEURONTIN) 100 MG capsule Take 2 capsules (200 mg total) by mouth at bedtime.  Marland Kitchen loratadine (CLARITIN) 10 MG tablet Take 10 mg by mouth daily.   Marland Kitchen  nitroGLYCERIN (NITROSTAT) 0.4 MG SL tablet Place 1 tablet (0.4 mg total) under the tongue every 5 (five) minutes as needed for chest pain.  . pantoprazole (PROTONIX) 40 MG tablet TAKE 1 TABLET BY MOUTH IN  THE MORNING  . potassium chloride (KLOR-CON) 10 MEQ tablet TAKE 1 TABLET BY MOUTH  TWICE DAILY  . promethazine (PHENERGAN) 25 MG tablet Take 1/2-1 tablet by mouth every 8 hours as needed for nausea.   No facility-administered medications prior to visit.    Review of Systems  Constitutional: Negative.   Respiratory: Negative for cough,  chest tightness and shortness of breath.   Cardiovascular: Negative for chest pain, palpitations and leg swelling.  Musculoskeletal: Negative for joint swelling and myalgias.  Neurological: Negative for dizziness, tremors, syncope, weakness, light-headedness and headaches.        Objective    BP (!) 144/67   Pulse 70   Temp 98.3 F (36.8 C)   Resp 20   Ht 5\' 3"  (1.6 m)   Wt 269 lb (122 kg) Comment: per pt  BMI 47.65 kg/m  BP Readings from Last 3 Encounters:  12/01/20 (!) 144/67  09/22/20 118/70  06/03/20 115/74   Wt Readings from Last 3 Encounters:  12/01/20 269 lb (122 kg)  09/22/20 279 lb 9.6 oz (126.8 kg)  06/03/20 274 lb 9.6 oz (124.6 kg)       Physical Exam Vitals reviewed.  Constitutional:      Appearance: Normal appearance.  Eyes:     General: No scleral icterus.    Conjunctiva/sclera: Conjunctivae normal.  Neck:     Vascular: No carotid bruit.  Cardiovascular:     Rate and Rhythm: Normal rate and regular rhythm.     Pulses: Normal pulses.     Heart sounds: Normal heart sounds.  Pulmonary:     Effort: Pulmonary effort is normal.     Breath sounds: Normal breath sounds.  Musculoskeletal:     Right lower leg: No edema.     Left lower leg: No edema.     Comments: Probable ganglion cyst on the dorsum of her right hand.  Skin:    General: Skin is warm and dry.  Neurological:     Mental Status: She is alert and oriented to person, place, and time. Mental status is at baseline.  Psychiatric:        Mood and Affect: Mood normal.        Behavior: Behavior normal.        Thought Content: Thought content normal.        Judgment: Judgment normal.       No results found for any visits on 12/01/20.  Assessment & Plan     1. Ischemic cardiomyopathy Clinically improved and is stable followed by cardiology at [facility].  2. Chronic systolic CHF (congestive heart failure), NYHA class 3 (HCC) Clinically stable now off of Entresto. Patient has pacemaker in  place.  3. Class 3 severe obesity due to excess calories with serious comorbidity and body mass index (BMI) of 45.0 to 49.9 in adult [facility]) Patient working on keeping weight down.  4. Arthritis Slows down mobility but encouraged her to stay active.  5. Hypercholesterolemia On atorvastatin 80.   No follow-ups on file.      I, Wilhemena Durie, MD, have reviewed all documentation for this visit. The documentation on 12/03/20 for the exam, diagnosis, procedures, and orders are all accurate and complete.    Shatina Streets Cranford Mon, MD  [facility]  Practice [phone] (phone) [phone] (fax)  [place]

## 2021-01-21 DIAGNOSIS — M1711 Unilateral primary osteoarthritis, right knee: Secondary | ICD-10-CM | POA: Diagnosis not present

## 2021-01-26 NOTE — Unmapped (Signed)
PCP:  Bosie Clos, MD    Patient ID: Cynthia Garrison is a 75 y.o. female.    Assessment and Plan:   1. History of NICM, now with normalized LV EF  2. CRT-D Cardiac defibrillator in place  3. History of cardiac catheterization- moderate LAD stenosis  4. Essential hypertension    Cynthia Garrison is doing well today after previous carotid stent procedure. Her carvedilol dose has been stable and she has a low resting heart rate.  Last LDL 95 on atorvastatin 80, will send in Rx for Repatha today  Continue BP monitoring at home,  Can titrate losartan to 100 mg if BP >140/90 consistently    Return in about 6 months (around 07/29/2021).    Elpidio Anis MD [facility]  [facility]   Division of Cardiology  [phone] pgr    Dictated using Animal nutritionist, please excuse typos    Subjective:      Cynthia Garrison returns for follow-up.  She was previously hospitalized at [facility] for elective R carotid stent placement.  Her hospital course was complicated by hypotension and her blood pressure medications were held.  She has made a good recovery.  She is a history of syncope and ICD placement as well as a nonischemic cardiomyopathy, ever her ejection fraction is now normal.  There is no history of clinical heart failure.    Cynthia Garrison returns for follow-up. Unable to walk on her R knee despite recent steroid injection after recent injury.  She is going back to see Ortho today.  No chest pain or DOE.  No PND/orthopnea or edema.  No syncope, falls injuries.     Patient Active Problem List   Diagnosis   ??? LBBB (left bundle branch block)   ??? Type 2 diabetes mellitus (CMS-HCC)   ??? Hyperlipidemia   ??? Arthritis   ??? Burn of esophagus   ??? Valgus deformity of great toe   ??? Headache   ??? Hypertension   ??? Hypercholesterolemia   ??? Angina pectoris (CMS-HCC)   ??? Cardiomyopathy (CMS-HCC)   ??? 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 Left bundle branch block (LBBB)   ??? Adiposity   ??? Complete atrioventricular block (CMS-HCC)   ??? Chronic systolic heart failure (CMS-HCC)   ??? History of cardiac catheterization   ??? Multiple-type hyperlipidemia   ??? Dyspnea on exertion   ??? Gastroesophageal reflux disease   ??? Ischemic stroke (CMS-HCC)   ??? Right cavernous carotid stenosis       Current Outpatient Medications   Medication Sig Dispense Refill   ??? acetaminophen (TYLENOL) 325 MG tablet Take 2 tablets (650 mg total) by mouth every six (6) hours as needed.  0   ??? atorvastatin (LIPITOR) 80 MG tablet Take 80 mg by mouth every morning.      ??? carvediloL (COREG) 6.25 MG tablet TAKE 1 TABLET BY MOUTH  TWICE DAILY WITH MEALS 180 tablet 3   ??? carvediloL (COREG) 6.25 MG tablet Take 1 tablet (6.25 mg total) by mouth Two (2) times a day. 180 tablet 1   ??? clopidogreL (PLAVIX) 75 mg tablet Take 1 tablet (75 mg total) by mouth daily. 90 tablet 3   ??? clopidogreL (PLAVIX) 75 mg tablet Take 1 tablet (75 mg total) by mouth daily. 90 tablet 3   ??? cyanocobalamin (VITAMIN B-12) 1000 MCG tablet Take 1,000 mcg by mouth daily.      ??? ezetimibe (  ZETIA) 10 mg tablet Take 1 tablet (10 mg total) by mouth daily. 90 tablet 3   ??? furosemide (LASIX) 20 MG tablet Take 20 mg by mouth. As needed     ??? loratadine (CLARITIN) 10 mg tablet Take 10 mg by mouth daily.     ??? losartan (COZAAR) 50 MG tablet TAKE 1 TABLET BY MOUTH  DAILY 90 tablet 3   ??? pantoprazole (PROTONIX) 40 MG tablet Take 40 mg by mouth daily.     ??? potassium chloride (KLOR-CON) 10 MEQ CR tablet Take 10 mEq by mouth Two (2) times a day.     ??? aspirin 81 MG chewable tablet Chew 1 tablet (81 mg total) daily. 30 tablet 0   ??? butalbital-acetaminophen-caffeine (FIORICET, ESGIC) 50-325-40 mg per tablet Take 1 tablet by mouth daily as needed. (Patient not taking: Reported on 01/27/2021)     ??? diphenhydrAMINE (BENADRYL) 50 mg capsule Take 1 capsule (50 mg total) by mouth Take as directed for 1 dose. Take 1 hour before procedure related to IV contrast allergy (Patient not taking: Reported on 12/04/2019) 30 capsule 0   ??? evolocumab 140 mg/mL PnIj Inject the contents of one pen (140 mg) under the skin every fourteen (14) days for 6 doses. 6 mL 3   ??? gabapentin (NEURONTIN) 100 MG capsule Take 1 capsule (100 mg total) by mouth nightly. (Patient taking differently: Take 100 mg by mouth two (2) times a day. ) 30 capsule 0   ??? Lactobacillus acidophilus (PROBIOTIC) 10 billion cell cap Take 1 capsule by mouth two (2) times a day. (Patient not taking: Reported on 02/25/2020)     ??? multivitamin with minerals tablet Take 1 tablet by mouth. (Patient not taking: Reported on 02/25/2020)     ??? nitroglycerin (NITROSTAT) 0.4 MG SL tablet Place 0.4 mg under the tongue. As needed (Patient not taking: Reported on 07/29/2020)     ??? predniSONE (DELTASONE) 50 MG tablet Take 1 tablet (50 mg total) by mouth Take as directed. 1 tablet 13hours, 7hours, 1hour before procedure.  IV contrast allergy (Patient not taking: Reported on 12/04/2019) 3 tablet 1   ??? promethazine (PHENERGAN) 25 MG tablet  (Patient not taking: Reported on 04/22/2020)     ??? sertraline (ZOLOFT) 50 MG tablet Take 50 mg by mouth daily.  (Patient not taking: Reported on 01/27/2021)       No current facility-administered medications for this visit.       Allergies   Allergen Reactions   ??? Iodinated Contrast Media Headache   ??? Lisinopril Other (See Comments)     Causes coughing     ??? Penicillins Rash       Review of Systems  Pertinent items are noted in HPI.      Objective:      Vitals:    01/27/21 1438   BP: 157/70   Pulse: 65   SpO2:    Body mass index is 49.53 kg/m??.    Wt Readings from Last 6 Encounters:   01/27/21 (!) 126.8 kg (279 lb 9.6 oz)   07/29/20 78.5 kg (173 lb)   04/22/20 (!) (P) 121.9 kg (268 lb 12.8 oz)   03/25/20 78.6 kg (173 lb 4.8 oz)   02/25/20 (!) 117 kg (258 lb)   12/04/19 (!) 121.3 kg (267 lb 8 oz)        Physical Exam  Gen:  female seated comfortably in the exam room.  HEENT: PERRL, EOMI, OP clear with MMM.  Neck: Supple  without LAD, TM, JVD, or HJR.  Resp: CTA bilaterally with normal WOB.  CV: RRR without m/r/g.  Abd: NABS.  Soft, NT/ND without HSM.  Ext: 2+ radial, PT, and DP pulses bilaterally.  No LE edema.  NEURO:  No gross abnormalities    Cardiographics    Echo 07/17/18  ?? Left ventricular hypertrophy - mild  ?? Normal left ventricular systolic function, ejection fraction > 55%  ?? Dilated left atrium - mild  ?? Mitral annular calcification  ?? Degenerative mitral valve disease  ?? Normal right ventricular systolic function  ?? Dilated right atrium - mild    Cath 01/29/15  1. Coronary  artery disease,  as described  above, including  70% mid LAD  stenosis with TIMI-3  flow.  2. Elevated  left ventricular  filling pressures  (LVEDP = 18 mmHg).  3. Global  hypokinesis with  left ventricular  ejection fraction  of 25%.    PVL Carotid Duplex 03/25/20  Right Carotid: Non-hemodynamically significant plaque noted in the CCA. Plaque noted in the ECA. Difficult to assess ICA stent region.  Left Carotid: There is evidence in the ICA of a 40-59% stenosis. Non-hemodynamically significant plaque noted in the CCA. Plaque noted in the ECA.  Vertebrals:  Both vertebral arteries were patent with antegrade flow.  Subclavians: Normal flow hemodynamics were seen in bilateral subclavian arteries.    Please excuse typos, dictation completed via Dragon voice recognition software    _____________________________________________________________    Scribe Attestation:         This document serves as a record of the services and decisions performed by Elpidio Anis, MD on 01/27/2021. It was created on his behalf by Inez Pilgrim, a trained medical scribe. The creation of this document is based on the provider's statements and observations that were conveyed to the medical scribe during the patient's encounter.     (The information in this document, created by the medical scribe for me, accurately reflects the services I personally performed and the decisions made by me. I have reviewed and approved this document for accuracy.)     Elpidio Anis  MD [facility]  Division of Cardiology   [place] [facility]   Office [phone]   Pager [phone]

## 2021-01-27 ENCOUNTER — Ambulatory Visit
Admit: 2021-01-27 | Discharge: 2021-01-28 | Payer: MEDICARE | Attending: Cardiovascular Disease | Primary: Cardiovascular Disease

## 2021-01-27 DIAGNOSIS — E7801 Familial hypercholesterolemia: Principal | ICD-10-CM

## 2021-01-27 MED ORDER — EVOLOCUMAB 140 MG/ML SUBCUTANEOUS PEN INJECTOR
SUBCUTANEOUS | 3 refills | 0.00000 days | Status: CP
Start: 2021-01-27 — End: 2021-04-08
  Filled 2021-02-14: qty 6, 84d supply, fill #0

## 2021-01-28 DIAGNOSIS — M1711 Unilateral primary osteoarthritis, right knee: Secondary | ICD-10-CM | POA: Diagnosis not present

## 2021-02-01 ENCOUNTER — Encounter: Admit: 2021-02-01 | Discharge: 2021-02-02 | Payer: MEDICARE

## 2021-02-01 DIAGNOSIS — B20 Human immunodeficiency virus [HIV] disease: Principal | ICD-10-CM

## 2021-02-01 DIAGNOSIS — I442 Atrioventricular block, complete: Principal | ICD-10-CM

## 2021-02-01 DIAGNOSIS — Z4502 Encounter for adjustment and management of automatic implantable cardiac defibrillator: Principal | ICD-10-CM

## 2021-02-01 NOTE — Unmapped (Signed)
[facility] Specialty Medication Onboarding    Specialty Medication: Repatha  Prior Authorization: Approved   Financial Assistance: Yes - grant approved as secondary   Final Copay/Day Supply: $0 / 32    Insurance Restrictions: None     Notes to Pharmacist: n/a    The triage team has completed the benefits investigation and has determined that the patient is able to fill this medication at [facility]. Please contact the patient to complete the onboarding or follow up with the prescribing physician as needed.

## 2021-02-01 NOTE — Unmapped (Signed)
Cardiac Implantable Electronic Device Remote Monitoring     Visit Date:  02/01/2021    Findings: Normal device function, Tested Lead measurements stable and within normal range, Adequate battery reserve-5.5  years    Arrhythmias:  1 ATR 1 seconds in duration EGM appears to show a short run of AF.    Plan: Continue Routine Remote Monitoring, needs an annual device check.     Manufacturer of Device: [employer]       Type of Device: CRT-D / Bi-Ventricular Defibrillator  See scanned/downloaded PDF report for model numbers, serial numbers, and date(s) of implant.    Presenting Rhythm: AS/Bi-VP      Percentage Biventricular Pacing: RV:100% LV: 100%    Heart Failure Monitoring: Not Applicable  ______________________________________________________________________      Please see downloaded PDF file of transmission under Media Tab  for full details of device interrogation to include, when applicable,  battery status/charge time, lead trend data, and programmed parameters.

## 2021-02-01 NOTE — Unmapped (Signed)
All appointments with [facility] EP REMOTE MONITORING occur from home.  You do not come in to the office or hospital for these visits.        Your Device data report from this visit can be found in your Mychart by following the steps below.  Step #1-Select Menu then document center    Step #2-Select my documents    Step #3-Select Device checks    Please contact us with any questions.    Thank you,    [facility] EP REMOTE MONIT[facility]  Phone. [phone]  Fax. [phone]  Email. [email or web address]

## 2021-02-02 NOTE — Unmapped (Signed)
[facility]   Patient Onboarding/Medication Counseling    Cynthia Garrison is a 75 y.o. female with hyperlipidemia who I am counseling today on initiation of therapy.  I am speaking to the patient and her daughter.    Was a Nurse, learning disability used for this call? No    Verified patient's date of birth / HIPAA.    Specialty medication(s) to be sent: General Specialty: Repatha      Non-specialty medications/supplies to be sent: Higher education careers adviser      Medications not needed at this time: n/a         Repatha (evolocumab)    Medication & Administration     Dosage: Inject the contents of 1 pen (140mg ) under the skin every 2 weeks.    Administration: Administer under the skin of the abdomen, thigh or upper arm. Rotate sites with each injection.  ??? Injection instructions - Autoinjector:  o Remove 1 Repatha autoinjector from the refrigerator and let stand at room temperature for at least 30 minutes.  o Check the autoinjector for the following:  - Expiration date  - Absence of any cracks or damage  - The medicine is clear and colorless and does not contain any particles  - The orange cap is present and securely attached  o Choose your injection site and clean with an alcohol wipe. Allow to air dry completely.  o Pull the orange cap straight off and discard  o Pinch the skin (or stretch) with your thumb and fingers creating an area 2 inches wide  o Maintaining the pinch (or stretch) press the pen to your skin at a 90 degree angle. Firmly push the autoinjector down until the skin stops moving and the yellow safety guard is no longer visible.  o Do not touch the gray start button yet  o When you are ready to inject, press the gray start button. You will hear a click that signals the start of the injection  o Continue to press the pen to your skin and lift your thumb  o The injection may take up to 15 seconds. You will know the injection is complete when the medication window turns yellow. You may also hear a second click.  o Remove the pen from your skin and discard the pen in a sharps container.  o If there is blood at the injection site, press a cotton ball or gauze to the site. Do not rub the injection site.    Adherence/Missed dose instructions: Administer a missed dose within 7 days and resume your normal schedule.  If it has been more than 7 days and you inject every 2 weeks, skip the missed dose and resume your normal schedule..     Goals of Therapy     Lower cholesterol, prevention of cardiovascular events in patients with established cardiovascular disease    Side Effects & Monitoring Parameters   ??? Flu-like symptoms  ??? Signs of a common cold  ??? Back pain  ??? Injection site irritation  ??? Nose or throat irritation    The following side effects should be reported to the provider:  ??? Signs of an allergic reaction  ??? Signs of high blood sugar (confusion, drowsiness, increase thirst/hunger/urination, fast breathing, flushing)      Contraindications, Warnings, & Precautions     ??? Latex (the packaging of Repatha may contain natural rubber)    Drug/Food Interactions     ??? Medication list reviewed in Epic. The patient was instructed to  inform the care team before taking any new medications or supplements. No drug interactions identified.     Storage, Handling Precautions, & Disposal   ??? Repatha should be stored in the refrigerator. If necessary, Repatha may be kept at room temperature for no more than 30 days.  ??? Place used devices in a sharps container for disposal.    Current Medications (including OTC/herbals), Comorbidities and Allergies     Current Outpatient Medications   Medication Sig Dispense Refill   ??? acetaminophen (TYLENOL) 325 MG tablet Take 2 tablets (650 mg total) by mouth every six (6) hours as needed.  0   ??? aspirin 81 MG chewable tablet Chew 1 tablet (81 mg total) daily. 30 tablet 0   ??? atorvastatin (LIPITOR) 80 MG tablet Take 80 mg by mouth every morning.      ??? butalbital-acetaminophen-caffeine (FIORICET, ESGIC) 50-325-40 mg per tablet Take 1 tablet by mouth daily as needed. (Patient not taking: Reported on 01/27/2021)     ??? carvediloL (COREG) 6.25 MG tablet TAKE 1 TABLET BY MOUTH  TWICE DAILY WITH MEALS 180 tablet 3   ??? carvediloL (COREG) 6.25 MG tablet Take 1 tablet (6.25 mg total) by mouth Two (2) times a day. 180 tablet 1   ??? clopidogreL (PLAVIX) 75 mg tablet Take 1 tablet (75 mg total) by mouth daily. 90 tablet 3   ??? clopidogreL (PLAVIX) 75 mg tablet Take 1 tablet (75 mg total) by mouth daily. 90 tablet 3   ??? cyanocobalamin (VITAMIN B-12) 1000 MCG tablet Take 1,000 mcg by mouth daily.      ??? diphenhydrAMINE (BENADRYL) 50 mg capsule Take 1 capsule (50 mg total) by mouth Take as directed for 1 dose. Take 1 hour before procedure related to IV contrast allergy (Patient not taking: Reported on 12/04/2019) 30 capsule 0   ??? evolocumab 140 mg/mL PnIj Inject the contents of one pen (140 mg) under the skin every fourteen (14) days for 6 doses. 6 mL 3   ??? ezetimibe (ZETIA) 10 mg tablet Take 1 tablet (10 mg total) by mouth daily. 90 tablet 3   ??? furosemide (LASIX) 20 MG tablet Take 20 mg by mouth. As needed     ??? gabapentin (NEURONTIN) 100 MG capsule Take 1 capsule (100 mg total) by mouth nightly. (Patient taking differently: Take 100 mg by mouth two (2) times a day. ) 30 capsule 0   ??? Lactobacillus acidophilus (PROBIOTIC) 10 billion cell cap Take 1 capsule by mouth two (2) times a day. (Patient not taking: Reported on 02/25/2020)     ??? loratadine (CLARITIN) 10 mg tablet Take 10 mg by mouth daily.     ??? losartan (COZAAR) 50 MG tablet TAKE 1 TABLET BY MOUTH  DAILY 90 tablet 3   ??? multivitamin with minerals tablet Take 1 tablet by mouth. (Patient not taking: Reported on 02/25/2020)     ??? nitroglycerin (NITROSTAT) 0.4 MG SL tablet Place 0.4 mg under the tongue. As needed (Patient not taking: Reported on 07/29/2020)     ??? pantoprazole (PROTONIX) 40 MG tablet Take 40 mg by mouth daily.     ??? potassium chloride (KLOR-CON) 10 MEQ CR tablet Take 10 mEq by mouth Two (2) times a day.     ??? predniSONE (DELTASONE) 50 MG tablet Take 1 tablet (50 mg total) by mouth Take as directed. 1 tablet 13hours, 7hours, 1hour before procedure.  IV contrast allergy (Patient not taking: Reported on 12/04/2019) 3 tablet 1   ??? promethazine (  PHENERGAN) 25 MG tablet  (Patient not taking: Reported on 04/22/2020)     ??? sertraline (ZOLOFT) 50 MG tablet Take 50 mg by mouth daily.  (Patient not taking: Reported on 01/27/2021)       No current facility-administered medications for this visit.       Allergies   Allergen Reactions   ??? Iodinated Contrast Media Headache   ??? Lisinopril Other (See Comments)     Causes coughing     ??? Penicillins Rash       Patient Active Problem List   Diagnosis   ??? LBBB (left bundle branch block)   ??? Type 2 diabetes mellitus (CMS-HCC)   ??? Hyperlipidemia   ??? Arthritis   ??? Burn of esophagus   ??? Valgus deformity of great toe   ??? Headache   ??? Hypertension   ??? Hypercholesterolemia   ??? Angina pectoris (CMS-HCC)   ??? Cardiomyopathy (CMS-HCC)   ??? 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 Left bundle branch block (LBBB)   ??? Adiposity   ??? Complete atrioventricular block (CMS-HCC)   ??? Chronic systolic heart failure (CMS-HCC)   ??? History of cardiac catheterization   ??? Multiple-type hyperlipidemia   ??? Dyspnea on exertion   ??? Gastroesophageal reflux disease   ??? Ischemic stroke (CMS-HCC)   ??? Right cavernous carotid stenosis       Reviewed and up to date in Epic.    Appropriateness of Therapy     Acute infections noted within Epic:  No active infections  Patient reported infection: None    Is medication and dose appropriate based on diagnosis and infection status? Yes    Prescription has been clinically reviewed: Yes      Baseline Quality of Life Assessment      How many days over the past month did your condition  keep you from your normal activities? For example, brushing your teeth or getting up in the morning. Patient declined to answer    Financial Information Medication Assistance provided: Prior Authorization and [employer]    Anticipated copay of $0 for an 84 day supply reviewed with patient. Verified delivery address.    Delivery Information     Scheduled delivery date: 02/15/21    Expected start date: 02/15/21    Medication will be delivered via UPS to the prescription address in [facility].  This shipment will not require a signature.      Explained the services we provide at [facility] and that each month we would call to set up refills.  Stressed importance of returning phone calls so that we could ensure they receive their medications in time each month.  Informed patient that we should be setting up refills 7-10 days prior to when they will run out of medication.  A pharmacist will reach out to perform a clinical assessment periodically.  Informed patient that a welcome packet, containing information about our pharmacy and other support services, a Notice of Privacy Practices, and a drug information handout will be sent.      Patient verbalized understanding of the above information as well as how to contact the pharmacy at [phone] option 4 with any questions/concerns.  The pharmacy is open Monday through Friday 8:30am-4:30pm.  A pharmacist is available 24/7 via pager to answer any clinical questions they may have.    Patient Specific Needs     - Does the patient have any physical, cognitive, or cultural barriers? No    -  Patient prefers to have medications discussed with  Patient     - Is the patient or caregiver able to read and understand education materials at a high school level or above? Yes    - Patient's primary language is  English     - Is the patient high risk? No    - Does the patient require a Care Management Plan? No     - Does the patient require physician intervention or other additional services (i.e. nutrition, smoking cessation, social work)? No      Camillo Flaming  [facility] Specialty Pharmacist

## 2021-02-03 MED ORDER — EMPTY CONTAINER
2 refills | 0 days
Start: 2021-02-03 — End: ?

## 2021-02-07 DIAGNOSIS — M1711 Unilateral primary osteoarthritis, right knee: Secondary | ICD-10-CM | POA: Diagnosis not present

## 2021-02-07 NOTE — Unmapped (Signed)
I have reviewed the battery, threshold, lead impedance, and overall device/lead status, diagnostic data including arrhythmic events and therapies on the remote device evaluation and agree with the documentation.    Gladiola Madore, MD

## 2021-02-14 MED FILL — EMPTY CONTAINER: 120 days supply | Qty: 1 | Fill #0

## 2021-02-16 NOTE — Unmapped (Unsigned)
Referring Provider: Doyce Loose, MD  [address]  [address]  [place],  [place] [address]   Primary Provider: Bosie Clos, MD  [address]  [place] [address]   Other Providers:  Dr Gust Rung EP Follow Up Note    Reason for Visit:  Cynthia Garrison is a 75 y.o. female being seen for routine visit and continued care of her BiV ICD.    Assessment & Plan:    1. NICM with recovered EF  s/p [employer] Sci CRT-D   Pacemaker/ICD checked by device nurse  I have reviewed and agree with the findings  Normal pacemaker function  A pace - <1%  BiV pace - 100%  Events - none  Battery - 5 years  Underlying Rhythm - SR  Continue remotes         ECG: V paced.  See official ECG report.    Follow-up:  Return for annual device check.    History of Present Illness:  Cynthia Garrison is a 75 y.o. female who presents in follow up for her CRT defibrillator.    Interval History: Arthritis in her right knee. Had a cortisone shot but didn't help. She is now on her second run of prednisone, it helps some. No CP, SOB, palpitations.      Cardiovascular History & Procedures:    Cath / PCI:  ?? Right carotid stent placement  ?? **    CV Surgery:  ?? **    EP Procedures and Devices:  ?? 02/02/2015 Boston Sci CRT-D implant  ?? **    Non-Invasive Evaluation(s):  Echo:  ?? 07/17/2018 TTE  ?? Left ventricular hypertrophy - mild  ?? Normal left ventricular systolic function, EF> 55%  ?? Dilated left atrium - mild  ?? Mitral annular calcification  ?? Degenerative mitral valve disease  ?? Normal right ventricular systolic function  ?? Dilated right atrium - mild    ?? **    Cardiac CT/MRI/Nuclear Tests:  ?? **                 Other Past Medical History:  See below for the complete EPIC list of past medical and surgical history.      Allergies:  Iodinated contrast media, Lisinopril, and Penicillins    Current Medications:    Current Outpatient Medications on File Prior to Visit   Medication Sig   ??? acetaminophen (TYLENOL) 325 MG tablet Take 2 tablets (650 mg total) by mouth every six (6) hours as needed.   ??? atorvastatin (LIPITOR) 80 MG tablet Take 80 mg by mouth every morning.    ??? butalbital-acetaminophen-caffeine (FIORICET, ESGIC) 50-325-40 mg per tablet Take 1 tablet by mouth daily as needed.    ??? carvediloL (COREG) 6.25 MG tablet Take 1 tablet (6.25 mg total) by mouth Two (2) times a day.   ??? clopidogreL (PLAVIX) 75 mg tablet Take 1 tablet (75 mg total) by mouth daily.   ??? cyanocobalamin (VITAMIN B-12) 1000 MCG tablet Take 1,000 mcg by mouth daily.    ??? empty container (SHARPS-A-GATOR DISPOSAL SYSTEM) Misc Use as directed for sharps disposal   ??? ezetimibe (ZETIA) 10 mg tablet Take 1 tablet (10 mg total) by mouth daily.   ??? furosemide (LASIX) 20 MG tablet Take 20 mg by mouth. As needed   ??? gabapentin (NEURONTIN) 100 MG capsule Take 1 capsule (100 mg total) by mouth nightly. (Patient taking differently: Take 100 mg by mouth  two (2) times a day. )   ??? Lactobacillus acidophilus (PROBIOTIC) 10 billion cell cap Take 1 capsule by mouth two (2) times a day.    ??? loratadine (CLARITIN) 10 mg tablet Take 10 mg by mouth daily.   ??? losartan (COZAAR) 50 MG tablet TAKE 1 TABLET BY MOUTH  DAILY   ??? methylPREDNISolone (MEDROL) 4 MG tablet methylprednisolone 4 mg tablet   ??? nitroglycerin (NITROSTAT) 0.4 MG SL tablet Place 0.4 mg under the tongue. As needed   ??? pantoprazole (PROTONIX) 40 MG tablet Take 40 mg by mouth daily.   ??? potassium chloride (KLOR-CON) 10 MEQ CR tablet Take 10 mEq by mouth Two (2) times a day.   ??? promethazine (PHENERGAN) 25 MG tablet    ??? aspirin 81 MG chewable tablet Chew 1 tablet (81 mg total) daily.   ??? evolocumab 140 mg/mL PnIj Inject the contents of one pen (140 mg) under the skin every fourteen (14) days for 6 doses. (Patient not taking: Reported on 02/17/2021)     No current facility-administered medications on file prior to visit.         Family History:  The patient's family history includes Bipolar disorder in her sister; COPD in her sister; Cancer in her mother; Deep vein thrombosis in her sister; Heart disease in her sister; Migraines in her brother and sister; Transient ischemic attack in her sister.    Social history:  She  reports that she quit smoking about 17 years ago. Her smoking use included cigarettes. She has a 10.00 pack-year smoking history. She has never used smokeless tobacco. She reports that she does not drink alcohol and does not use drugs.    Review of Systems:  As per HPI and as follows.  Rest of the review of ten systems is negative or unremarkable except as stated above.    Physical Exam:  VITAL SIGNS:   Vitals:    02/17/21 1539   BP: (P) 155/87   Pulse: (P) 61   SpO2: (P) 97%      Wt Readings from Last 3 Encounters:   02/17/21 (!) 127.3 kg (280 lb 9.6 oz)   01/27/21 (!) 126.8 kg (279 lb 9.6 oz)   07/29/20 78.5 kg (173 lb)      Today's Body mass index is 49.71 kg/m??.   Height: 160 cm (5' 3) (stated)    GENERAL: obese but well-appearing in no acute distress  HEENT: Normocephalic and atraumatic. Conjunctivae and sclerae clear and anicteric.    NECK: Supple.   CARDIOVASCULAR: Rate and rhythm are regular.  Normal S1, S2. There is no murmur, gallops or rubs  RESPIRATORY: Normal respiratory effort..  There are no wheezes.  ABDOMEN: Soft, non-tender, Abdomen nondistended.    EXTREMITIES: There is no pedal edema, bilaterally.   SKIN: No rashes, ecchymosis or petechiae.  Warm, well perfused. Well healed left sided ICD scar   NEURO/PSYCH: Alert and oriented x 3. Affect appropriate.  Nonfocal    Pertinent Laboratory Studies:   Clinical Support on 02/17/2021   Component Date Value Ref Range Status   ??? EKG Ventricular Rate 02/17/2021 59  BPM Incomplete   ??? EKG Atrial Rate 02/17/2021 59  BPM Incomplete   ??? EKG P-R Interval 02/17/2021 132  ms Incomplete   ??? EKG QRS Duration 02/17/2021 124  ms Incomplete   ??? EKG Q-T Interval 02/17/2021 442  ms Incomplete   ??? EKG QTC Calculation 02/17/2021 437  ms Incomplete   ??? EKG Calculated P Axis  02/17/2021 50  degrees Incomplete   ??? EKG Calculated R Axis 02/17/2021 -53  degrees Incomplete   ??? EKG Calculated T Axis 02/17/2021 83  degrees Incomplete   ??? QTC Fredericia 02/17/2021 439  ms Incomplete       Lab Results   Component Value Date    PRO-BNP 735.0 (H) 01/31/2015    Creatinine Whole Blood, POC 0.8 05/12/2019    Creatinine Whole Blood, POC 1.0 02/13/2017    Creatinine 1.17 (H) 11/07/2019    Creatinine 0.77 05/12/2019    BUN 23 (H) 11/07/2019    BUN 16 05/12/2019    Sodium 141 11/07/2019    Sodium Whole Blood 138 08/08/2018    Potassium 4.8 11/07/2019    Potassium, Bld 3.3 (L) 08/08/2018    CO2 29.0 11/07/2019    Magnesium 2.0 11/07/2019    Total Bilirubin 0.6 11/07/2019    INR 1.14 08/08/2018       No results found for: DIGOXIN    Lab Results   Component Value Date    TSH 2.653 11/07/2019    Cholesterol 158 07/14/2018    Triglycerides 76 07/14/2018    HDL 48 07/14/2018    Non-HDL Cholesterol 110 07/14/2018    LDL Calculated 95 07/14/2018       Lab Results   Component Value Date    WBC 7.0 11/07/2019    HGB 13.2 11/07/2019    Hemoglobin 7.8 (L) 08/08/2018    HCT 39.9 11/07/2019    Platelet 221 11/07/2019           Past Medical History:   Diagnosis Date   ??? Abnormal fibroblast fatty acid beta-oxidation analysis    ??? Adiposity    ??? Angina pectoris (CMS-HCC)    ??? Anxiety    ??? Arthritis    ??? Bilateral carotid artery stenosis    ??? Burn of esophagus    ??? CAD (coronary artery disease)    ??? Cardiac defibrillator in place    ??? Cardiomyopathy (CMS-HCC)    ??? Cervical radiculopathy    ??? Chronic systolic CHF (congestive heart failure) (CMS-HCC)    ??? Colon polyps    ??? Complete AV block (CMS-HCC)    ??? CVA (cerebral vascular accident) (CMS-HCC)     with resolved L sided deficits   ??? Diarrhea    ??? DOE (dyspnea on exertion)    ??? GERD (gastroesophageal reflux disease)    ??? Hyperlipidemia    ??? Hypertension    ??? Ischemic stroke (CMS-HCC)    ??? LBBB (left bundle branch block)    ??? Migraine    ??? TIA (transient ischemic attack) multiple TIA's   ??? Type 2 diabetes mellitus (CMS-HCC)    ??? Valgus deformity of great toe        Past Surgical History:   Procedure Laterality Date   ??? CARDIAC DEFIBRILLATOR PLACEMENT     ??? CERVICAL SPINE SURGERY     ??? FOOT SURGERY Bilateral     buinions   ??? HERNIA REPAIR     ??? IR CAROTID STENT W DISTAL PROTECTION  08/07/2018    IR CAROTID STENT W DISTAL PROTECTION 08/07/2018 Thora Lance, MD IMG VIR H&V [facility]   ??? pacmaker implant      ??? PR COLONOSCOPY W/BIOPSY SINGLE/MULTIPLE N/A 02/25/2020    Procedure: COLONOSCOPY, FLEXIBLE, PROXIMAL TO SPLENIC FLEXURE; WITH BIOPSY, SINGLE OR MULTIPLE;  Surgeon: Maris Berger, MD;  [facility];  Service: Gastroenterology   ??? PR COLSC  FLX W/RMVL OF TUMOR POLYP LESION SNARE TQ N/A 02/25/2020    Procedure: COLONOSCOPY FLEX; W/REMOV TUMOR/LES BY SNARE;  Surgeon: Maris Berger, MD;  Location: [facility];  Service: Gastroenterology   ??? TONSILLECTOMY Cardiomyopathy (CMS-HCC)    ??? Cervical radiculopathy    ??? Chronic systolic CHF (congestive heart failure) (CMS-HCC)    ??? Colon polyps    ??? Complete AV block (CMS-HCC)    ??? CVA (cerebral vascular accident) (CMS-HCC)     with resolved L sided deficits   ??? Diarrhea    ??? DOE (dyspnea on exertion)    ??? GERD (gastroesophageal reflux disease)    ??? Hyperlipidemia    ??? Hypertension    ??? Ischemic stroke (CMS-HCC)    ??? LBBB (left bundle branch block)    ??? Migraine    ??? TIA (transient ischemic attack)     multiple TIA's   ??? Type 2 diabetes mellitus (CMS-HCC)    ??? Valgus deformity of great toe        Past Surgical History:   Procedure Laterality Date   ??? CARDIAC DEFIBRILLATOR PLACEMENT     ??? CERVICAL SPINE SURGERY     ??? FOOT SURGERY Bilateral     buinions   ??? HERNIA REPAIR     ??? IR CAROTID STENT W DISTAL PROTECTION  08/07/2018    IR CAROTID STENT W DISTAL PROTECTION 08/07/2018 Thora Lance, MD IMG VIR [facility]   ??? pacmaker implant      ??? PR COLONOSCOPY W/BIOPSY SINGLE/MULTIPLE N/A 02/25/2020    Procedure: COLONOSCOPY, FLEXIBLE, PROXIMAL TO SPLENIC FLEXURE; WITH BIOPSY, SINGLE OR MULTIPLE;  Surgeon: Maris Berger, MD;  Location: [facility];  Service: Gastroenterology   ??? PR COLSC FLX W/RMVL OF TUMOR POLYP LESION SNARE TQ N/A 02/25/2020    Procedure: COLONOSCOPY FLEX; W/REMOV TUMOR/LES BY SNARE;  Surgeon: Maris Berger, MD;  Location: GI [facility];  Service: Gastroenterology   ??? TONSILLECTOMY

## 2021-02-17 ENCOUNTER — Telehealth: Payer: Self-pay

## 2021-02-17 ENCOUNTER — Institutional Professional Consult (permissible substitution)
Admit: 2021-02-17 | Discharge: 2021-02-18 | Payer: MEDICARE | Attending: Nurse Practitioner | Primary: Nurse Practitioner

## 2021-02-17 DIAGNOSIS — I447 Left bundle-branch block, unspecified: Principal | ICD-10-CM

## 2021-02-17 DIAGNOSIS — Z7982 Long term (current) use of aspirin: Secondary | ICD-10-CM | POA: Diagnosis not present

## 2021-02-17 DIAGNOSIS — Z87891 Personal history of nicotine dependence: Secondary | ICD-10-CM | POA: Diagnosis not present

## 2021-02-17 DIAGNOSIS — I5022 Chronic systolic (congestive) heart failure: Secondary | ICD-10-CM | POA: Diagnosis not present

## 2021-02-17 DIAGNOSIS — I428 Other cardiomyopathies: Secondary | ICD-10-CM | POA: Diagnosis not present

## 2021-02-17 DIAGNOSIS — E785 Hyperlipidemia, unspecified: Secondary | ICD-10-CM | POA: Diagnosis not present

## 2021-02-17 DIAGNOSIS — E119 Type 2 diabetes mellitus without complications: Secondary | ICD-10-CM | POA: Diagnosis not present

## 2021-02-17 DIAGNOSIS — Z4502 Encounter for adjustment and management of automatic implantable cardiac defibrillator: Secondary | ICD-10-CM | POA: Diagnosis not present

## 2021-02-17 DIAGNOSIS — I2510 Atherosclerotic heart disease of native coronary artery without angina pectoris: Secondary | ICD-10-CM | POA: Diagnosis not present

## 2021-02-17 DIAGNOSIS — Z79899 Other long term (current) drug therapy: Secondary | ICD-10-CM | POA: Diagnosis not present

## 2021-02-17 DIAGNOSIS — I110 Hypertensive heart disease with heart failure: Secondary | ICD-10-CM | POA: Diagnosis not present

## 2021-02-17 NOTE — Telephone Encounter (Signed)
Ok to write? Please advise. Thanks!

## 2021-02-17 NOTE — Telephone Encounter (Signed)
Copied from [place] [phone]. Topic: General - Other >> Feb 17, 2021  9:06 AM Oneta Rack wrote: Reason for CRM: patient would like PCP to write a letter excusing her from [facility] Duty permanently. Patient states the notice states if she is 23 and older she can be excused. Letter is due by 03/23/2021. Also patient is requesting a handicap placard

## 2021-02-17 NOTE — Telephone Encounter (Signed)
ok 

## 2021-02-22 ENCOUNTER — Telehealth: Payer: Self-pay

## 2021-02-22 NOTE — Telephone Encounter (Signed)
Copied from [place] [phone]. Topic: General - Other >> Feb 17, 2021  9:06 AM Oneta Rack wrote: Reason for CRM: patient would like PCP to write a letter excusing her from [facility] Duty permanently. Patient states the notice states if she is 57 and older she can be excused. Letter is due by 03/23/2021. Also patient is requesting a handicap placard >> Feb 22, 2021 10:00 AM Yvette Rack wrote: Pt requests call back once the letter is prepared and ready for pick up. Pt also asked for an update on the handicap placard. Pt requests call back

## 2021-02-22 NOTE — Telephone Encounter (Signed)
Okay for the letter and  handicap

## 2021-03-03 NOTE — Telephone Encounter (Signed)
Pt called to see if her letters were ready to pick up/ please advise  Pt states she needs letter by today to get it to [place]/ the nurse advise the letter would be ready by last Friday / please call asap

## 2021-03-07 NOTE — Telephone Encounter (Signed)
Done    Patient advised

## 2021-03-22 ENCOUNTER — Other Ambulatory Visit: Payer: Self-pay | Admitting: Family Medicine

## 2021-03-22 NOTE — Telephone Encounter (Signed)
Requested Prescriptions  Pending Prescriptions Disp Refills  . gabapentin (NEURONTIN) 100 MG capsule [Pharmacy Med Name: Gabapentin 100 MG Oral Capsule] 180 capsule 1    Sig: TAKE 2 CAPSULES BY MOUTH AT BEDTIME     Neurology: Anticonvulsants - gabapentin Passed - 03/22/2021  9:46 PM      Passed - Valid encounter within last 12 months    Recent Outpatient Visits          3 months ago Ischemic cardiomyopathy   [facility] Jerrol Banana., MD   6 months ago Need for immunization against influenza   [facility] Jerrol Banana., MD   9 months ago Medicare annual wellness visit, subsequent   [facility] Jerrol Banana., MD   1 year ago Essential hypertension   [facility] Jerrol Banana., MD   1 year ago Chronic systolic CHF (congestive heart failure), NYHA class 3 [facility])   [facility] Jerrol Banana., MD      Future Appointments            In 2 months Jerrol Banana., MD [facility] [place]

## 2021-04-01 NOTE — Unmapped (Signed)
[facility] Clinical Intervention    Type of intervention: Side effect management    Medication involved: Repatha     Problem identified: Ms Zaugg has stomach pains, not at site of injection only but more inside.  Pains are not sharp and there is no blood in urine and stools.  Today she is feeling well but it is her usual day for Repatha    Intervention performed: I advised her to hold her dose today and monitor for pains throughout the weekend.  If she is feeling well on Tuesday administer the dose at a different site.  If at that point there is pain, call MD to relay SE.  If no pain persists, stop administering in stomach and choose different sites.      Follow-up needed: Clinical is scheduled for next fill, pharmacist will check in    Approximate time spent: 10-15 minutes    Clinical evidence used to support intervention: Drug information resource    Tyse Auriemma Vangie Bicker   [facility] Specialty Pharmacist

## 2021-04-02 ENCOUNTER — Encounter: Admit: 2021-04-02 | Discharge: 2021-04-03 | Disposition: A | Discharge status: Home / Self Care | Payer: MEDICARE

## 2021-04-02 ENCOUNTER — Emergency Department: Admit: 2021-04-02 | Discharge: 2021-04-03 | Disposition: A | Discharge status: Home / Self Care | Payer: MEDICARE

## 2021-04-02 DIAGNOSIS — K769 Liver disease, unspecified: Secondary | ICD-10-CM | POA: Diagnosis not present

## 2021-04-02 DIAGNOSIS — I429 Cardiomyopathy, unspecified: Secondary | ICD-10-CM | POA: Diagnosis not present

## 2021-04-02 DIAGNOSIS — R9431 Abnormal electrocardiogram [ECG] [EKG]: Secondary | ICD-10-CM | POA: Diagnosis not present

## 2021-04-02 DIAGNOSIS — M79606 Pain in leg, unspecified: Secondary | ICD-10-CM | POA: Diagnosis not present

## 2021-04-02 DIAGNOSIS — R0602 Shortness of breath: Secondary | ICD-10-CM | POA: Diagnosis not present

## 2021-04-02 DIAGNOSIS — I890 Lymphedema, not elsewhere classified: Secondary | ICD-10-CM | POA: Diagnosis not present

## 2021-04-02 DIAGNOSIS — Z87891 Personal history of nicotine dependence: Secondary | ICD-10-CM | POA: Diagnosis not present

## 2021-04-02 DIAGNOSIS — I82409 Acute embolism and thrombosis of unspecified deep veins of unspecified lower extremity: Secondary | ICD-10-CM | POA: Diagnosis not present

## 2021-04-02 DIAGNOSIS — E785 Hyperlipidemia, unspecified: Secondary | ICD-10-CM | POA: Diagnosis not present

## 2021-04-02 DIAGNOSIS — I2699 Other pulmonary embolism without acute cor pulmonale: Secondary | ICD-10-CM | POA: Diagnosis not present

## 2021-04-02 DIAGNOSIS — R600 Localized edema: Secondary | ICD-10-CM | POA: Diagnosis not present

## 2021-04-02 DIAGNOSIS — I110 Hypertensive heart disease with heart failure: Secondary | ICD-10-CM | POA: Diagnosis not present

## 2021-04-02 DIAGNOSIS — I5022 Chronic systolic (congestive) heart failure: Secondary | ICD-10-CM | POA: Diagnosis not present

## 2021-04-02 DIAGNOSIS — R911 Solitary pulmonary nodule: Secondary | ICD-10-CM | POA: Diagnosis not present

## 2021-04-02 DIAGNOSIS — R0989 Other specified symptoms and signs involving the circulatory and respiratory systems: Secondary | ICD-10-CM | POA: Diagnosis not present

## 2021-04-02 DIAGNOSIS — L539 Erythematous condition, unspecified: Secondary | ICD-10-CM | POA: Diagnosis not present

## 2021-04-02 DIAGNOSIS — M79662 Pain in left lower leg: Secondary | ICD-10-CM | POA: Diagnosis not present

## 2021-04-02 DIAGNOSIS — I2510 Atherosclerotic heart disease of native coronary artery without angina pectoris: Secondary | ICD-10-CM | POA: Diagnosis not present

## 2021-04-02 DIAGNOSIS — I8393 Asymptomatic varicose veins of bilateral lower extremities: Secondary | ICD-10-CM | POA: Diagnosis not present

## 2021-04-02 DIAGNOSIS — E119 Type 2 diabetes mellitus without complications: Secondary | ICD-10-CM | POA: Diagnosis not present

## 2021-04-02 DIAGNOSIS — Z880 Allergy status to penicillin: Secondary | ICD-10-CM | POA: Diagnosis not present

## 2021-04-02 DIAGNOSIS — Z7982 Long term (current) use of aspirin: Secondary | ICD-10-CM | POA: Diagnosis not present

## 2021-04-02 LAB — COMPREHENSIVE METABOLIC PANEL
ALBUMIN: 3.3 g/dL — ABNORMAL LOW (ref 3.4–5.0)
ALKALINE PHOSPHATASE: 149 U/L — ABNORMAL HIGH (ref 46–116)
ALT (SGPT): 27 U/L (ref 10–49)
ANION GAP: 10 mmol/L (ref 5–14)
AST (SGOT): 27 U/L (ref <=34)
BILIRUBIN TOTAL: 0.5 mg/dL (ref 0.3–1.2)
BLOOD UREA NITROGEN: 11 mg/dL (ref 9–23)
BUN / CREAT RATIO: 11
CALCIUM: 8.9 mg/dL (ref 8.7–10.4)
CHLORIDE: 106 mmol/L (ref 98–107)
CO2: 25.9 mmol/L (ref 20.0–31.0)
CREATININE: 1.02 mg/dL — ABNORMAL HIGH
EGFR CKD-EPI (2021) FEMALE: 57 mL/min/{1.73_m2} — ABNORMAL LOW (ref >=60)
GLUCOSE RANDOM: 107 mg/dL (ref 70–179)
POTASSIUM: 3.9 mmol/L (ref 3.4–4.8)
PROTEIN TOTAL: 6.4 g/dL (ref 5.7–8.2)
SODIUM: 142 mmol/L (ref 135–145)

## 2021-04-02 LAB — CBC W/ AUTO DIFF
BASOPHILS ABSOLUTE COUNT: 0 10*9/L (ref 0.0–0.1)
BASOPHILS RELATIVE PERCENT: 0.7 %
EOSINOPHILS ABSOLUTE COUNT: 0.2 10*9/L (ref 0.0–0.5)
EOSINOPHILS RELATIVE PERCENT: 3.2 %
HEMATOCRIT: 30.3 % — ABNORMAL LOW (ref 34.0–44.0)
HEMOGLOBIN: 10.2 g/dL — ABNORMAL LOW (ref 11.3–14.9)
LYMPHOCYTES ABSOLUTE COUNT: 1.1 10*9/L (ref 1.1–3.6)
LYMPHOCYTES RELATIVE PERCENT: 16 %
MEAN CORPUSCULAR HEMOGLOBIN CONC: 33.7 g/dL (ref 32.0–36.0)
MEAN CORPUSCULAR HEMOGLOBIN: 27.2 pg (ref 25.9–32.4)
MEAN CORPUSCULAR VOLUME: 80.8 fL (ref 77.6–95.7)
MEAN PLATELET VOLUME: 8.1 fL (ref 6.8–10.7)
MONOCYTES ABSOLUTE COUNT: 0.6 10*9/L (ref 0.3–0.8)
MONOCYTES RELATIVE PERCENT: 9.1 %
NEUTROPHILS ABSOLUTE COUNT: 4.7 10*9/L (ref 1.8–7.8)
NEUTROPHILS RELATIVE PERCENT: 71 %
NUCLEATED RED BLOOD CELLS: 0 /100{WBCs} (ref <=4)
PLATELET COUNT: 180 10*9/L (ref 150–450)
RED BLOOD CELL COUNT: 3.75 10*12/L — ABNORMAL LOW (ref 3.95–5.13)
RED CELL DISTRIBUTION WIDTH: 15.5 % — ABNORMAL HIGH (ref 12.2–15.2)
WBC ADJUSTED: 6.7 10*9/L (ref 3.6–11.2)

## 2021-04-02 LAB — LIPASE: LIPASE: 28 U/L (ref 12–53)

## 2021-04-02 LAB — HIGH SENSITIVITY TROPONIN I - 2 HOUR SERIAL
HIGH SENSITIVITY TROPONIN - DELTA (0-2H): 1 ng/L (ref <=7)
HIGH-SENSITIVITY TROPONIN I - 2 HOUR: 29 ng/L (ref <=34)

## 2021-04-02 LAB — URINALYSIS WITH CULTURE REFLEX
BACTERIA: NONE SEEN /HPF
BILIRUBIN UA: NEGATIVE
BLOOD UA: NEGATIVE
GLUCOSE UA: NEGATIVE
KETONES UA: NEGATIVE
LEUKOCYTE ESTERASE UA: NEGATIVE
NITRITE UA: NEGATIVE
PH UA: 5.5 (ref 5.0–9.0)
PROTEIN UA: NEGATIVE
RBC UA: 0 /HPF (ref <4)
SPECIFIC GRAVITY UA: 1.015 (ref 1.005–1.040)
SQUAMOUS EPITHELIAL: 3 /HPF (ref 0–5)
UROBILINOGEN UA: 0.2
WBC UA: 0 /HPF (ref 0–5)

## 2021-04-02 LAB — HIGH SENSITIVITY TROPONIN I - SERIAL: HIGH SENSITIVITY TROPONIN I: 28 ng/L (ref <=34)

## 2021-04-02 LAB — B-TYPE NATRIURETIC PEPTIDE: B-TYPE NATRIURETIC PEPTIDE: 100.2 pg/mL — ABNORMAL HIGH (ref <=100)

## 2021-04-02 LAB — D-DIMER, QUANTITATIVE: D-DIMER QUANTITATIVE (CW,ML,HL,HS,CH): 5016 ng{FEU}/mL — ABNORMAL HIGH (ref <=500)

## 2021-04-02 NOTE — Unmapped (Signed)
Pt with possible blood clot to LLE. Pt stating she is feeling SOB with ambulation. Pt stating abdominal pain where she is getting her shots. Pt was recently started on a new cholesterol medication she injects in her abdomen. Pt stating just don't feel right.

## 2021-04-02 NOTE — Unmapped (Signed)
[facility] Emergency Department Provider Note      ED Course, Assessment and Plan     Initial Clinical Impression:    April 02, 2021 4:17 PM   Cynthia Garrison is a 75 y.o. female with a history of hypertension, left bundle branch block, complete AV block, CVA, CAD, CHF, chronic dyspnea on exertion pacemaker defibrillator placement, GERD, diabetes who presents emergency department today for evaluation of left lower extremity pain, redness, swelling for the past 24 hours followed by onset shortly thereafter of dyspnea on exertion.    On exam, the patient is chronically ill-appearing, morbidly obese.  No acute distress.  Resting comfortably in bed.  Initial vital signs of blood pressure 134/62.  Afebrile.  No tachypnea or hypoxia.  Afebrile.  Cardiopulmonary exam significant for faint crackles in the left lower lung lobe.  Abdominal exam is benign, soft, nontender.  She has a focal area of redness and warmth to her left medial calf extending up to her left medial knee.  Potentially very mild left lower extremity edema in comparison to the right.  Many varicose veins throughout bilateral lower extremities.  Lymphedema to bilateral lower extremities.  Bilateral DP pulses are 2+.    High suspicion at this point based on exam is for superficial thrombophlebitis versus mild lymphangitis.  Very low suspicion for DVT given her exam.  Patient does not appear fluid overloaded, and seems to be at/near her dry weight as of cards clinic visit in April 2022. Dyspnea may be due to mild CHF exacerbation versus pulmonary embolism, but suspicion for PE is low.  Low suspicion for ACS, but will assess for this.  Will evaluate with orders as below.    BP 162/60  - Pulse 71  - Temp 36.9 ??C (98.5 ??F) (Oral)  - Resp 24  - Ht 162.6 cm (5' 4)  - Wt (!) 124.7 kg (275 lb)  - SpO2 98%  - BMI 47.20 kg/m??     Diagnostic orders as below.    Orders Placed This Encounter   Procedures   ??? XR Chest Portable   ??? ED POCUS Chest Bilateral   ??? CTA Chest W Contrast   ??? Comprehensive Metabolic Panel   ??? Lipase Level   ??? CBC w/ Differential   ??? Urinalysis with Culture Reflex   ??? B-type natriuretic peptide   ??? hsTroponin I (serial 0-2-6H w/ delta)   ??? ED Extra Tubes   ??? hsTroponin I - 2 Hour   ??? hsTroponin I - 6 Hour   ??? D-Dimer, Quantitative   ??? ECG 12 Lead       ED Course:  ED Course as of 04/03/21 0025   Sat Apr 02, 2021   1825 Creatinine of 1.02, near baseline from 1 year ago.  BNP essentially within normal limits at 100.  Lipase 28.  CBC without evidence of leukocytosis or new anemia.   1953 D-dimer 5016.  Initial Trop 28, repeat Trop 29.  EKG shows an a sensed, V paced rhythm without significant change from previous.  Chest x-ray shows evidence of vascular congestion without overt pulmonary edema.  In setting of elevated D-dimer, dyspnea on exertion, will proceed with CT of the chest to evaluate for pulmonary embolism.  Patient does have a noted iodinated contrast allergy and has required 4-hour preps in the past prior to imaging.  We will initiate 4-hour prep with methylprednisolone and Benadryl.  We will plan for CTA chest thereafter.  If CTA chest  is positive, patient will require anticoagulation, if negative, patient can be discharged home with PCP follow-up and referral for outpatient PVL.  Shows an a sensed, V paced rhythm without significant change from previous.       _____________________________________________________________________    The case was discussed with attending physician who is in agreement with the above assessment and plan    Additional Medical Decision Making     I have reviewed the vital signs, nursing notes, and prior medical records if available. Labs, radiology results, and EKGs that were available during my care of the patient were independently reviewed by me and considered in my medical decision making.    History     CHIEF COMPLAINT:   Chief Complaint   Patient presents with   ??? Leg Pain       HPI: Cynthia Garrison is a 75 y.o. female with a history of hypertension, left bundle branch block, complete AV block, CVA, CAD, CHF, chronic dyspnea on exertion pacemaker defibrillator placement, GERD, diabetes who presents emergency department today for evaluation of left lower extremity pain, small area of focal redness to the medial left calf, slight increase of left lower extremity swelling for the past 24 hours followed by onset shortly thereafter of dyspnea on exertion.  Patient states that her dyspnea on exertion since yesterday has been slightly worse than usual.  She denies chest pain, palpitations, fevers, chills, cough, sore throat.  No recent trauma to the leg.  Is able to ambulate without issue.  No headaches, vision changes, focal weakness/numbness.    PAST MEDICAL HISTORY/PAST SURGICAL HISTORY:   Past Medical History:   Diagnosis Date   ??? Abnormal fibroblast fatty acid beta-oxidation analysis    ??? Adiposity    ??? Angina pectoris (CMS-HCC)    ??? Anxiety    ??? Arthritis    ??? Bilateral carotid artery stenosis    ??? Burn of esophagus    ??? CAD (coronary artery disease)    ??? Cardiac defibrillator in place    ??? Cardiomyopathy (CMS-HCC)    ??? Cervical radiculopathy    ??? Chronic systolic CHF (congestive heart failure) (CMS-HCC)    ??? Colon polyps    ??? Complete AV block (CMS-HCC)    ??? CVA (cerebral vascular accident) (CMS-HCC)     with resolved L sided deficits   ??? Diarrhea    ??? DOE (dyspnea on exertion)    ??? GERD (gastroesophageal reflux disease)    ??? Hyperlipidemia    ??? Hypertension    ??? Ischemic stroke (CMS-HCC)    ??? LBBB (left bundle branch block)    ??? Migraine    ??? TIA (transient ischemic attack)     multiple TIA's   ??? Type 2 diabetes mellitus (CMS-HCC)    ??? Valgus deformity of great toe        Past Surgical History:   Procedure Laterality Date   ??? CARDIAC DEFIBRILLATOR PLACEMENT     ??? CERVICAL SPINE SURGERY     ??? FOOT SURGERY Bilateral     buinions   ??? HERNIA REPAIR     ??? IR CAROTID STENT W DISTAL PROTECTION  08/07/2018    IR CAROTID STENT W DISTAL PROTECTION 08/07/2018 Thora Lance, MD IMG VIR H&V [facility]   ??? pacmaker implant      ??? PR COLONOSCOPY W/BIOPSY SINGLE/MULTIPLE N/A 02/25/2020    Procedure: COLONOSCOPY, FLEXIBLE, PROXIMAL TO SPLENIC FLEXURE; WITH BIOPSY, SINGLE OR MULTIPLE;  Surgeon: Maris Berger, MD;  Location: GI  [facility];  Service: Gastroenterology   ??? PR COLSC FLX W/RMVL OF TUMOR POLYP LESION SNARE TQ N/A 02/25/2020    Procedure: COLONOSCOPY FLEX; W/REMOV TUMOR/LES BY SNARE;  Surgeon: Maris Berger, MD;  Location: GI PROC[facility]URES [facility]  Service: Gastroenterology   ??? TONSILLECTOMY         MEDICATIONS:     Current Facility-Administered Medications:   ???  methylPREDNISolone sodium succinate (PF) (Solu-MEDROL) injection 40 mg, 40 mg, Intravenous, Once, Konrad Penta, MD    Current Outpatient Medications:   ???  acetaminophen (TYLENOL) 325 MG tablet, Take 2 tablets (650 mg total) by mouth every six (6) hours as needed., Disp: , Rfl: 0  ???  aspirin 81 MG chewable tablet, Chew 1 tablet (81 mg total) daily., Disp: 30 tablet, Rfl: 0  ???  atorvastatin (LIPITOR) 80 MG tablet, Take 80 mg by mouth every morning. , Disp: , Rfl:   ???  butalbital-acetaminophen-caffeine (FIORICET, ESGIC) 50-325-40 mg per tablet, Take 1 tablet by mouth daily as needed. , Disp: , Rfl:   ???  carvediloL (COREG) 6.25 MG tablet, Take 1 tablet (6.25 mg total) by mouth Two (2) times a day., Disp: 180 tablet, Rfl: 1  ???  clopidogreL (PLAVIX) 75 mg tablet, Take 1 tablet (75 mg total) by mouth daily., Disp: 90 tablet, Rfl: 3  ???  cyanocobalamin (VITAMIN B-12) 1000 MCG tablet, Take 1,000 mcg by mouth daily. , Disp: , Rfl:   ???  empty container (SHARPS-A-GATOR DISPOSAL SYSTEM) Misc, Use as directed for sharps disposal, Disp: 1 each, Rfl: 2  ???  evolocumab 140 mg/mL PnIj, Inject the contents of one pen (140 mg) under the skin every fourteen (14) days for 6 doses. (Patient not taking: Reported on 02/17/2021), Disp: 6 mL, Rfl: 3  ???  ezetimibe (ZETIA) 10 mg tablet, Take 1 tablet (10 mg total) by mouth daily., Disp: 90 tablet, Rfl: 3  ???  furosemide (LASIX) 20 MG tablet, Take 20 mg by mouth. As needed, Disp: , Rfl:   ???  gabapentin (NEURONTIN) 100 MG capsule, Take 1 capsule (100 mg total) by mouth nightly. (Patient taking differently: Take 100 mg by mouth two (2) times a day. ), Disp: 30 capsule, Rfl: 0  ???  Lactobacillus acidophilus (PROBIOTIC) 10 billion cell cap, Take 1 capsule by mouth two (2) times a day. , Disp: , Rfl:   ???  loratadine (CLARITIN) 10 mg tablet, Take 10 mg by mouth daily., Disp: , Rfl:   ???  losartan (COZAAR) 50 MG tablet, TAKE 1 TABLET BY MOUTH  DAILY, Disp: 90 tablet, Rfl: 3  ???  methylPREDNISolone (MEDROL) 4 MG tablet, methylprednisolone 4 mg tablet, Disp: , Rfl:   ???  nitroglycerin (NITROSTAT) 0.4 MG SL tablet, Place 0.4 mg under the tongue. As needed, Disp: , Rfl:   ???  pantoprazole (PROTONIX) 40 MG tablet, Take 40 mg by mouth daily., Disp: , Rfl:   ???  potassium chloride (KLOR-CON) 10 MEQ CR tablet, Take 10 mEq by mouth Two (2) times a day., Disp: , Rfl:   ???  promethazine (PHENERGAN) 25 MG tablet, , Disp: , Rfl:     ALLERGIES:   Iodinated contrast media, Lisinopril, and Penicillins    SOCIAL HISTORY:   Social History     Tobacco Use   ??? Smoking status: Former Smoker     Packs/day: 0.50     Years: 20.00     Pack years: 10.00     Types: Cigarettes  Quit date: 2005     Years since quitting: 17.5   ??? Smokeless tobacco: Never Used   Substance Use Topics   ??? Alcohol use: Not Currently       FAMILY HISTORY:  Family History   Problem Relation Age of Onset   ??? Cancer Mother         GI   ??? COPD Sister    ??? Migraines Brother    ??? Deep vein thrombosis Sister    ??? Bipolar disorder Sister    ??? Heart disease Sister    ??? Migraines Sister    ??? Transient ischemic attack Sister    ??? Aneurysm Neg Hx           Review of Systems  A 10 point review of systems was performed and is negative other than positive elements noted in HPI     Physical Exam     VITAL SIGNS:    BP 162/60  - Pulse 71  - Temp 36.9 ??C (98.5 ??F) (Oral)  - Resp 24  - Ht 162.6 cm (5' 4)  - Wt (!) 124.7 kg (275 lb)  - SpO2 98%  - BMI 47.20 kg/m??     Constitutional: Alert and oriented.  Chronically ill-appearing.  Obese.  No acute distress.  Resting comfortably in bed.  Eyes: Conjunctivae are normal.  ENT       Head: Normocephalic and atraumatic.       Nose: No congestion.       Mouth/Throat: Mucous membranes are moist.       Neck: No stridor.  Cardiovascular: S1, S2,  Normal and symmetric distal pulses are present in all extremities. Warm and well perfused.  Respiratory: Normal respiratory effort. Breath sounds are normal.  Gastrointestinal: Soft and nondistended. Nontender.  Musculoskeletal: Normal range of motion in all extremities.   She has bilateral lower extremity lymphedema.  Left lower extremity does not appear overtly more swollen than the left right.  No pitting edema.  Bilateral DP pulses are 2+.  She has a focal area of erythema and tenderness at her proximal left calf extending to her medial knee in the distribution of her great saphenous vein.  There is no calf tenderness.  Capillary refill is brisk and equal in bilateral lower extremities.  Neurologic: Normal speech and language. Alert and oriented x3. No gross focal neurologic deficits are appreciated.  Skin: Skin is warm, dry and intact. No rash noted.  Psychiatric: Mood and affect are normal. Speech and behavior are normal.    Radiology     XR Chest Portable   Final Result   Low lung volumes without focal consolidation.      ====================   ADDENDUM (04/02/2021 7:31 PM):    On review, the following additional findings were noted:      Vascular congestion without overt pulmonary edema.      ED POCUS Chest Bilateral    (Results Pending)   CTA Chest W Contrast    (Results Pending)       Labs     Labs Reviewed   COMPREHENSIVE METABOLIC PANEL - Abnormal; Notable for the following components:       Result Value    Creatinine 1.02 (*)     eGFR CKD-EPI (2021) Female 13 (*)     Albumin 3.3 (*)     Alkaline Phosphatase 149 (*)     All other components within normal limits   B-TYPE NATRIURETIC PEPTIDE - Abnormal; Notable for the following  components:    BNP 100.20 (*)     All other components within normal limits   D-DIMER, QUANTITATIVE - Abnormal; Notable for the following components:    D-Dimer 5,016 (*)     All other components within normal limits    Narrative:     When used in conjunction with a clinical Pre-Test Probability assessment to exclude the venous thromboembolism (VTE) in outpatients suspected of deep vein thrombosis (DVT) and/or pulmonary embolism (PE), a cut-off of <500 ng/mL FEUs is recommended.     On 04-01-2020, [facility] converted D-Dimer testing from DDU (D-dimer Units) to FEUs (Fibrinogen Equivalence Units). Results reported prior to 0800 on 04-01-2020 were reported using DDUs which lowered the reported value in ng/mL by 2-3 fold.       CBC W/ AUTO DIFF - Abnormal; Notable for the following components:    RBC 3.75 (*)     HGB 10.2 (*)     HCT 30.3 (*)     RDW 15.5 (*)     All other components within normal limits   LIPASE - Normal   HIGH SENSITIVITY TROPONIN I - SERIAL - Normal   HIGH SENSITIVITY TROPONIN I - 2 HOUR SERIAL - Normal   CBC W/ DIFFERENTIAL    Narrative:     The following orders were created for panel order CBC w/ Differential.  Procedure                               Abnormality         Status                     ---------                               -----------         ------                     CBC w/ Differential[[id number]]         Abnormal            Final result                 Please view results for these tests on the individual orders.   URINALYSIS WITH CULTURE REFLEX   EXTRA TUBES    Narrative:     The following orders were created for panel order ED Extra Tubes.  Procedure                               Abnormality         Status                     --------- -----------         ------                     LIGHT BLUE CITRATE EXTR.Marland KitchenMarland Kitchen[[id number]]                      Final result                 Please view results for these tests on the individual orders.   HIGH SENSITIVITY TROPONIN I - 6 HOUR SERIAL   LIGHT  BLUE CITRATE EXTRA TUBE       Pertinent labs & imaging results that were available during my care of the patient were reviewed by me and considered in my medical decision making (see chart for details).    Please note- This chart has been created using [employer]. Chart creation errors have been sought, but may not always be located and such creation errors, especially pronoun confusion, do NOT reflect on the standard of medical care.    Konrad Penta, MD  EM PGY-2     Konrad Penta, MD  Resident  04/03/21 [phone]

## 2021-04-03 DIAGNOSIS — I2699 Other pulmonary embolism without acute cor pulmonale: Secondary | ICD-10-CM | POA: Diagnosis not present

## 2021-04-03 DIAGNOSIS — R911 Solitary pulmonary nodule: Secondary | ICD-10-CM | POA: Diagnosis not present

## 2021-04-03 LAB — HIGH SENSITIVITY TROPONIN I - 6 HOUR SERIAL
HIGH SENSITIVITY TROPONIN - DELTA (2-6H): 5 ng/L (ref <=7)
HIGH-SENSITIVITY TROPONIN I - 6 HOUR: 34 ng/L (ref <=34)

## 2021-04-03 MED ORDER — APIXABAN 5 MG TABLET
ORAL_TABLET | ORAL | 0 refills | 30 days | Status: CP
Start: 2021-04-03 — End: 2021-05-03

## 2021-04-03 MED ADMIN — methylPREDNISolone sodium succinate (PF) (Solu-MEDROL) injection 40 mg: 40 mg | INTRAVENOUS | @ 04:00:00 | Stop: 2021-04-03

## 2021-04-03 MED ADMIN — iohexoL (OMNIPAQUE) 350 mg iodine/mL solution 75 mL: 75 mL | INTRAVENOUS | @ 05:00:00 | Stop: 2021-04-03

## 2021-04-03 MED ADMIN — apixaban (ELIQUIS) tablet 10 mg: 10 mg | ORAL | @ 07:00:00 | Stop: 2021-04-03

## 2021-04-03 MED ADMIN — methylPREDNISolone sodium succinate (PF) (Solu-MEDROL) injection 40 mg: 40 mg | INTRAVENOUS | Stop: 2021-04-02

## 2021-04-03 MED ADMIN — acetaminophen (TYLENOL) tablet 1,000 mg: 1000 mg | ORAL | Stop: 2021-04-02

## 2021-04-03 MED ADMIN — diphenhydrAMINE (BENADRYL) injection: 50 mg | INTRAVENOUS | @ 04:00:00 | Stop: 2021-04-02

## 2021-04-03 NOTE — Unmapped (Signed)
Progress Note:    Received signout from previous resident Dr. Misty Stanley    Briefly, this is a 75 y.o. female with a past medical history of hypertension, complete AV block, CVA, CAD, CHF, s/p pacemaker/defibrillator presenting to the emergency department for evaluation of left lower extremity pain and swelling.  Concern for possible DVT from prior team.  Patient D-dimer elevated to 5000.  At time of signout, CTA chest pending to rule out PE.      ED Course:    2:24 AM: Received call from radiology.  Patient has multiple segmental PEs on the right including the right upper, middle, and lower lobes on CTA chest.  Per PESI scoring, patient is low risk for 30-day mortality.  I went to discuss the results with patient and her adult daughter at bedside.  Discussed patient's PESI scoring and the fact that she is low risk for 30-day mortality.  They feel comfortable with starting oral anticoagulation and being discharged.  Will start patient on Eliquis and refer to [facility] benign hematology clinic for outpatient follow-up.    2:44 AM: Received call from radiology.  On further read of patient's CTA, patient has several new hypodense lesions throughout the liver suspicion of metastases.  This increases patient's PESI score to intermediate risk.  I went to have further discussion with patient and her daughter and offered admission for further management of her PE versus discharge home.  They prefer to be discharged.  Patient has been given initial dose of Eliquis in the ED.  She has been referred to [facility] PE/DVT clinic.  Instructed them that they need to talk to their PCP this week to have the liver lesions further evaluated as an outpatient.  Patient and daughter feel comfortable this plan and are amenable to discharge.  Strict return precautions reviewed.

## 2021-04-05 ENCOUNTER — Telehealth: Payer: Self-pay

## 2021-04-05 NOTE — Telephone Encounter (Signed)
Appt scheduled for 04/07/21 to discuss findings at [facility] ER visit on 04/02/21.

## 2021-04-05 NOTE — Telephone Encounter (Signed)
Patient called to request an appt with Dr. Rosanna Randy only, she refused to schedule with any other provider. Patient was seen at  [facility] ER, patient also requesting a referral to cancer center for "spots" on liver shown on CT scan done at ER.

## 2021-04-07 ENCOUNTER — Ambulatory Visit (INDEPENDENT_AMBULATORY_CARE_PROVIDER_SITE_OTHER): Payer: Medicare Other | Admitting: Family Medicine

## 2021-04-07 ENCOUNTER — Other Ambulatory Visit: Payer: Self-pay

## 2021-04-07 VITALS — BP 126/68 | HR 88 | Wt 282.0 lb

## 2021-04-07 DIAGNOSIS — R160 Hepatomegaly, not elsewhere classified: Secondary | ICD-10-CM

## 2021-04-07 DIAGNOSIS — I2699 Other pulmonary embolism without acute cor pulmonale: Secondary | ICD-10-CM

## 2021-04-07 DIAGNOSIS — I5022 Chronic systolic (congestive) heart failure: Secondary | ICD-10-CM

## 2021-04-07 DIAGNOSIS — R1084 Generalized abdominal pain: Secondary | ICD-10-CM

## 2021-04-07 DIAGNOSIS — I2583 Coronary atherosclerosis due to lipid rich plaque: Secondary | ICD-10-CM | POA: Diagnosis not present

## 2021-04-07 DIAGNOSIS — I824Z9 Acute embolism and thrombosis of unspecified deep veins of unspecified distal lower extremity: Secondary | ICD-10-CM | POA: Diagnosis not present

## 2021-04-07 DIAGNOSIS — I2510 Atherosclerotic heart disease of native coronary artery without angina pectoris: Secondary | ICD-10-CM

## 2021-04-07 NOTE — Unmapped (Signed)
I returned patient's call and left a voice message requesting she call us back.
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
## 2021-04-07 NOTE — Unmapped (Signed)
-----   Message from Collie Siad sent at 04/05/2021 11:11 AM EDT -----  Regarding: medication question  Hi,   Ms. Harris called today, she want to speak to Dr. Oliver Hum Nurse about a medication change. She is starting to have some other issues, and wants to know if she can stop one if her medications. She can be reached @ [phone].  Thanks!

## 2021-04-07 NOTE — Unmapped (Signed)
Called to speak to Dr. Andrey Farmer' staff.  RN called & spoke to pt. Pt just want to let Dr. Andrey Farmer' knows about what's been going on: that she stopped her shot in the stomach because it hurt, that she went to the ED over the weekend, that they found blood clot in her leg, that they see something in her liver, & has upcoming apt to check it 7/8. Stated she's taking her Eliquis.

## 2021-04-07 NOTE — Progress Notes (Signed)
Established patient visit   Patient: Anna Marsh   DOB: 1945/10/26   75 y.o. Female  MRN: [id number] Visit Date: 04/07/2021  Today's healthcare provider: Wilhemena Durie, MD   Chief Complaint  Patient presents with   ER follow up    Subjective    HPI  Patient was seen in the ED for pulmonary embolus found as she was suffering from dyspnea and lower extremity swelling.  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  She is having some abdominal bloating and discomfort in addition to her PE symptoms but she is doing well with her Eliquis.  She is now on Repatha but is having abdominal pain and bloating that she associates with the  Repatha Follow up ER visit  Patient was seen in ER for evaluation of left lower extremity pain, redness, swelling for the past 24 hours followed by onset shortly thereafter of dyspnea on exertion on 04/02/2021. She was treated for right pulmonary embolus. Treatment for this included starting on Eliquis. She reports fair compliance with treatment. She reports this condition is Improved.  In addition, CT scan showed lesions ("spots"), on her liver that is concerning for possible cancer.       Medications: Outpatient Medications Prior to Visit  Medication Sig   atorvastatin (LIPITOR) 80 MG tablet TAKE 1 TABLET BY MOUTH  DAILY   butalbital-acetaminophen-caffeine (FIORICET) 50-325-40 MG tablet Take 1 tablet by mouth every 4 (four) hours as needed for headache.   clopidogrel (PLAVIX) 75 MG tablet    cyanocobalamin 1000 MCG tablet Take 1,000 mcg by mouth daily.    furosemide (LASIX) 20 MG tablet TAKE 1 TABLET BY MOUTH  DAILY AS NEEDED   gabapentin (NEURONTIN) 100 MG capsule TAKE 2 CAPSULES BY MOUTH AT BEDTIME   loratadine (CLARITIN) 10 MG tablet Take 10 mg by mouth daily.    pantoprazole (PROTONIX) 40 MG tablet TAKE 1 TABLET BY MOUTH IN  THE MORNING   potassium chloride (KLOR-CON) 10 MEQ  tablet TAKE 1 TABLET BY MOUTH  TWICE DAILY   promethazine (PHENERGAN) 25 MG tablet Take 1/2-1 tablet by mouth every 8 hours as needed for nausea.   carvedilol (COREG) 6.25 MG tablet Take by mouth.   ENTRESTO 24-26 MG  (Patient not taking: No sig reported)   nitroGLYCERIN (NITROSTAT) 0.4 MG SL tablet Place 1 tablet (0.4 mg total) under the tongue every 5 (five) minutes as needed for chest pain.   No facility-administered medications prior to visit.    Review of Systems      Objective    BP 126/68   Pulse 88   Wt 282 lb (127.9 kg)   BMI 49.95 kg/m  BP Readings from Last 3 Encounters:  04/11/21 (!) 152/57  04/07/21 126/68  12/01/20 (!) 144/67   Wt Readings from Last 3 Encounters:  04/11/21 276 lb (125.2 kg)  04/07/21 282 lb (127.9 kg)  12/01/20 269 lb (122 kg)       Physical Exam Vitals reviewed.  Constitutional:      Appearance: Normal appearance.  Eyes:     General: No scleral icterus.    Conjunctiva/sclera: Conjunctivae normal.  Neck:     Vascular: No carotid bruit.  Cardiovascular:     Rate and Rhythm: Normal rate and regular rhythm.     Pulses: Normal pulses.     Heart sounds: Normal heart sounds.  Pulmonary:     Effort: Pulmonary effort  is normal.     Breath sounds: Normal breath sounds.  Musculoskeletal:     Right lower leg: Edema present.     Left lower leg: Edema present.  Skin:    General: Skin is warm and dry.  Neurological:     Mental Status: She is alert and oriented to person, place, and time. Mental status is at baseline.  Psychiatric:        Mood and Affect: Mood normal.        Behavior: Behavior normal.        Thought Content: Thought content normal.        Judgment: Judgment normal.      No results found for any visits on 04/07/21.  Assessment & Plan     1. Liver mass Referral to oncology this week. - Ambulatory referral to Hematology / Oncology  2. Chronic systolic CHF (congestive heart failure), NYHA class 3 (HCC) Clinically  stable off of Entresto.  3. Coronary artery disease due to lipid rich plaque All risk factors treated - Comprehensive metabolic panel  4. Pulmonary embolism on right [facility]) Patient tolerating Eliquis. Likely provoked from uncertain cancer diagnosis.  5. Acute deep vein thrombosis (DVT) of distal vein of lower extremity, unspecified laterality ([place])   6. Generalized abdominal pain Hold Repatha and aspirin for the time being. - CBC with Differential/Platelet   No follow-ups on file.      I, Wilhemena Durie, MD, have reviewed all documentation for this visit. The documentation on 04/13/21 for the exam, diagnosis, procedures, and orders are all accurate and complete.    Maelle Sheaffer Cranford Mon, MD  [facility] [phone] (phone) [phone] (fax)  [place]

## 2021-04-07 NOTE — Patient Instructions (Signed)
Stop Repatha and Aspirin

## 2021-04-08 ENCOUNTER — Encounter: Admit: 2021-04-08 | Discharge: 2021-04-09 | Payer: MEDICARE

## 2021-04-08 DIAGNOSIS — I2699 Other pulmonary embolism without acute cor pulmonale: Principal | ICD-10-CM

## 2021-04-08 DIAGNOSIS — Z7982 Long term (current) use of aspirin: Secondary | ICD-10-CM | POA: Diagnosis not present

## 2021-04-08 DIAGNOSIS — I429 Cardiomyopathy, unspecified: Secondary | ICD-10-CM | POA: Diagnosis not present

## 2021-04-08 DIAGNOSIS — K769 Liver disease, unspecified: Secondary | ICD-10-CM | POA: Diagnosis not present

## 2021-04-08 DIAGNOSIS — Z7902 Long term (current) use of antithrombotics/antiplatelets: Secondary | ICD-10-CM | POA: Diagnosis not present

## 2021-04-08 DIAGNOSIS — I2510 Atherosclerotic heart disease of native coronary artery without angina pectoris: Secondary | ICD-10-CM | POA: Diagnosis not present

## 2021-04-08 DIAGNOSIS — I5022 Chronic systolic (congestive) heart failure: Secondary | ICD-10-CM | POA: Diagnosis not present

## 2021-04-08 DIAGNOSIS — Z87891 Personal history of nicotine dependence: Secondary | ICD-10-CM | POA: Diagnosis not present

## 2021-04-08 DIAGNOSIS — E785 Hyperlipidemia, unspecified: Secondary | ICD-10-CM | POA: Diagnosis not present

## 2021-04-08 DIAGNOSIS — Z880 Allergy status to penicillin: Secondary | ICD-10-CM | POA: Diagnosis not present

## 2021-04-08 DIAGNOSIS — I110 Hypertensive heart disease with heart failure: Secondary | ICD-10-CM | POA: Diagnosis not present

## 2021-04-08 DIAGNOSIS — Z7901 Long term (current) use of anticoagulants: Secondary | ICD-10-CM | POA: Diagnosis not present

## 2021-04-08 DIAGNOSIS — D649 Anemia, unspecified: Secondary | ICD-10-CM | POA: Diagnosis not present

## 2021-04-08 DIAGNOSIS — E119 Type 2 diabetes mellitus without complications: Secondary | ICD-10-CM | POA: Diagnosis not present

## 2021-04-08 LAB — CBC WITH DIFFERENTIAL/PLATELET
Basophils Absolute: 0.1 10*3/uL (ref 0.0–0.2)
Basos: 1 %
EOS (ABSOLUTE): 0.3 10*3/uL (ref 0.0–0.4)
Eos: 5 %
Hematocrit: 30.5 % — ABNORMAL LOW (ref 34.0–46.6)
Hemoglobin: 10 g/dL — ABNORMAL LOW (ref 11.1–15.9)
Immature Grans (Abs): 0 10*3/uL (ref 0.0–0.1)
Immature Granulocytes: 1 %
Lymphocytes Absolute: 0.9 10*3/uL (ref 0.7–3.1)
Lymphs: 11 %
MCH: 27.1 pg (ref 26.6–33.0)
MCHC: 32.8 g/dL (ref 31.5–35.7)
MCV: 83 fL (ref 79–97)
Monocytes Absolute: 0.5 10*3/uL (ref 0.1–0.9)
Monocytes: 6 %
Neutrophils Absolute: 5.9 10*3/uL (ref 1.4–7.0)
Neutrophils: 76 %
Platelets: 222 10*3/uL (ref 150–450)
RBC: 3.69 x10E6/uL — ABNORMAL LOW (ref 3.77–5.28)
RDW: 13.8 % (ref 11.7–15.4)
WBC: 7.6 10*3/uL (ref 3.4–10.8)

## 2021-04-08 LAB — COMPREHENSIVE METABOLIC PANEL
ALT: 26 IU/L (ref 0–32)
AST: 19 IU/L (ref 0–40)
Albumin/Globulin Ratio: 1.5 (ref 1.2–2.2)
Albumin: 3.4 g/dL — ABNORMAL LOW (ref 3.7–4.7)
Alkaline Phosphatase: 144 IU/L — ABNORMAL HIGH (ref 44–121)
BUN/Creatinine Ratio: 14 (ref 12–28)
BUN: 14 mg/dL (ref 8–27)
Bilirubin Total: 0.4 mg/dL (ref 0.0–1.2)
CO2: 24 mmol/L (ref 20–29)
Calcium: 8.5 mg/dL — ABNORMAL LOW (ref 8.7–10.3)
Chloride: 106 mmol/L (ref 96–106)
Creatinine, Ser: 0.98 mg/dL (ref 0.57–1.00)
Globulin, Total: 2.2 g/dL (ref 1.5–4.5)
Glucose: 128 mg/dL — ABNORMAL HIGH (ref 65–99)
Potassium: 4 mmol/L (ref 3.5–5.2)
Sodium: 142 mmol/L (ref 134–144)
Total Protein: 5.6 g/dL — ABNORMAL LOW (ref 6.0–8.5)
eGFR: 60 mL/min/{1.73_m2} (ref >59)

## 2021-04-08 MED ORDER — APIXABAN 5 MG TABLET
ORAL_TABLET | Freq: Two times a day (BID) | ORAL | 1 refills | 30.00000 days | Status: CP
Start: 2021-04-08 — End: 2021-06-07

## 2021-04-08 NOTE — Unmapped (Signed)
Cynthia Garrison, you have been seen for your newly diagnosed likely DVT and PE.    You should continue treatment with Eliquis 10mg  BID x7 days, then 5mg  BID for at least the next 3 months.    You should return in 3 months at which time we will discuss longer term anticoagulation plans.  We may also repeat imaging and complete further labs at that time.    You should start an iron supplement over the counter to see if this improves your anemia.     If you have any new symptoms or any side effects from the blood thinner (atypical bruising or bleeding, GI bleeding, etc.) or have any difficulty obtaining your medication, please call or send a message via MyChart.    You can find further information and resources for blood clots at [email or web address]

## 2021-04-11 ENCOUNTER — Other Ambulatory Visit: Payer: Self-pay

## 2021-04-11 ENCOUNTER — Inpatient Hospital Stay: Payer: Medicare Other | Attending: Oncology | Admitting: Oncology

## 2021-04-11 ENCOUNTER — Inpatient Hospital Stay: Payer: Medicare Other

## 2021-04-11 ENCOUNTER — Encounter: Payer: Self-pay | Admitting: Oncology

## 2021-04-11 VITALS — BP 152/57 | HR 68 | Temp 98.0°F | Resp 18 | Wt 276.0 lb

## 2021-04-11 DIAGNOSIS — I2699 Other pulmonary embolism without acute cor pulmonale: Secondary | ICD-10-CM | POA: Diagnosis not present

## 2021-04-11 DIAGNOSIS — Z7901 Long term (current) use of anticoagulants: Secondary | ICD-10-CM | POA: Diagnosis not present

## 2021-04-11 DIAGNOSIS — Z87891 Personal history of nicotine dependence: Secondary | ICD-10-CM | POA: Insufficient documentation

## 2021-04-11 DIAGNOSIS — C787 Secondary malignant neoplasm of liver and intrahepatic bile duct: Secondary | ICD-10-CM | POA: Insufficient documentation

## 2021-04-11 DIAGNOSIS — Z800 Family history of malignant neoplasm of digestive organs: Secondary | ICD-10-CM | POA: Insufficient documentation

## 2021-04-11 DIAGNOSIS — C801 Malignant (primary) neoplasm, unspecified: Secondary | ICD-10-CM | POA: Insufficient documentation

## 2021-04-11 DIAGNOSIS — D649 Anemia, unspecified: Secondary | ICD-10-CM | POA: Diagnosis not present

## 2021-04-11 NOTE — Progress Notes (Signed)
Patient here for oncology follow-up appointment, expresses complaints of abdominal pain and SOB with exertion, also concerns of ultrasound results

## 2021-04-12 ENCOUNTER — Encounter: Payer: Self-pay | Admitting: Oncology

## 2021-04-12 NOTE — Progress Notes (Signed)
Hematology/Oncology Consult note [facility] Telephone:([phone] [phone]  Patient Care Team: Jerrol Banana., MD as PCP - General (Family Medicine)   Name of the patient: Anna Marsh  [id number]  [birth year]    Reason for referral- liver lesions   Referring physician- Dr.Gilbert  Date of visit: 04/12/21   History of presenting illness- Patient is a 75 year old female with a past medical history significant for hypertension hyperlipidemia GERD, TIAs and strokes in the past.  She is on Plavix for her strokes.  She presented to [facility] health care with symptoms of left lower extremity thrombophlebitis her D-dimer was elevated at 5016.  She underwent CT a chest with contrast which showed multiple segmental pulmonary emboli in the right upper middle and lower lobe.  No evidence of right heart strain.  Several hepatic hypodensities including 3.2 cm segment 6 lesion and 3.5 cm segment 4B lesion concerning for metastatic disease.  No primary lung mass other than a 5 mm subpleural nodule.  No evidence of mediastinal adenopathy.  Patient did not undergo lower extremity ultrasound at that day as the machine was not working and underwent 5 days later which did not show any evidence of DVT in bilateral lower extremities.  The deep vein of the left calf was not adequately visualized.  Patient was seen by [facility] hematology for her PE and was recommended to stay on Eliquis 5 mg twice daily and also asked to stay on Plavix given her prior history of multiple strokes.  She is here for further evaluation of her liver lesions.  She is up-to-date with her colonoscopy and mammograms.  She reports symptoms of abdominal bloating and lower abdominal pain.  Denies any vaginal bleeding.  History of prostate cancer in her brother and colon cancer in her mother.  Patient lives with her daughter and prior to her PE she was independent of her ADLs.  Reports some ongoing fatigue.  Appetite  is fair  ECOG PS- 1  Pain scale- 0   Review of systems- Review of Systems  Constitutional:  Positive for malaise/fatigue. Negative for chills, fever and weight loss.  HENT:  Negative for congestion, ear discharge and nosebleeds.   Eyes:  Negative for blurred vision.  Respiratory:  Negative for cough, hemoptysis, sputum production, shortness of breath and wheezing.   Cardiovascular:  Negative for chest pain, palpitations, orthopnea and claudication.  Gastrointestinal:  Negative for abdominal pain, blood in stool, constipation, diarrhea, heartburn, melena, nausea and vomiting.       Abdominal bloating  Genitourinary:  Negative for dysuria, flank pain, frequency, hematuria and urgency.  Musculoskeletal:  Negative for back pain, joint pain and myalgias.  Skin:  Negative for rash.  Neurological:  Negative for dizziness, tingling, focal weakness, seizures, weakness and headaches.  Endo/Heme/Allergies:  Does not bruise/bleed easily.  Psychiatric/Behavioral:  Negative for depression and suicidal ideas. The patient does not have insomnia.    Allergies  Allergen Reactions   Iodinated Diagnostic Agents Other (See Comments)    Causes migraines   Iodine     Contrast Dye causes migraines   Lisinopril Cough   Penicillins Rash    Has patient had a PCN reaction causing immediate rash, facial/tongue/throat swelling, SOB or lightheadedness with hypotension: Yes Has patient had a PCN reaction causing severe rash involving mucus membranes or skin necrosis: No Has patient had a PCN reaction that required hospitalization: No Has patient had a PCN reaction occurring within the last 10 years: Yes If all of  the above answers are "NO", then may proceed with Cephalosporin use.    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   GERD (gastroesophageal reflux disease) 02/28/2015   Hypercholesterolemia 02/28/2015   Hypertension 02/28/2015   Cardiac defibrillator in place 02/28/2015   Block, bundle branch, left 02/28/2015   Adiposity 02/28/2015   Acid reflux 02/28/2015   Complete atrioventricular block (HCC) 70/62/3762   Chronic systolic CHF (congestive heart failure), NYHA class 3 ([place]) 02/24/2015   H/O cardiac catheterization 02/24/2015   Combined fat and carbohydrate induced hyperlipemia 02/15/2015   Cardiomyopathy ([place]) 02/01/2015   Angina pectoris ([place]) 12/25/2013   Arthritis 12/25/2013   Hallux abducto valgus 12/25/2013     Past Medical History:  Diagnosis Date   AICD (automatic cardioverter/defibrillator) present    Anemia    Anxiety    AV block, complete (HCC)    Cervical radiculopathy    CHF (congestive heart failure) (HCC)    Chronic systolic heart failure (HCC)    GERD (gastroesophageal reflux disease)    History of cardiac arrest    Hypercholesteremia    Hypertension    Pre-diabetes    Presence of permanent cardiac pacemaker 02/03/2015   [facility]  with AICD   Right arm weakness      Past Surgical History:  Procedure Laterality Date   ABDOMINAL HYSTERECTOMY     CATARACT EXTRACTION W/PHACO Left 12/18/2016   Procedure: CATARACT EXTRACTION PHACO AND INTRAOCULAR LENS PLACEMENT ([place])  left;  Surgeon: Ronnell Freshwater, MD;  Location: [place];  Service: Ophthalmology;  Laterality: Left;   CATARACT EXTRACTION W/PHACO Right 01/01/2017   Procedure: CATARACT EXTRACTION PHACO AND INTRAOCULAR LENS PLACEMENT ([place])  Right;  Surgeon: Ronnell Freshwater, MD;  Location: [place];  Service: Ophthalmology;  Laterality: Right;   HERNIA REPAIR     umbilical x 2   NECK SURGERY     PACEMAKER IMPLANT  02/03/2015   [facility]   TONSILLECTOMY      Social History   Socioeconomic  History   Marital status: Widowed    Spouse name: Not on file   Number of children: Not on file   Years of education: Not on file   Highest education level: Not on file  Occupational History   Not on file  Tobacco Use   Smoking status: Former    Packs/day: 0.50    Years: 20.00    Pack years: 10.00    Types: Cigarettes    Quit date: 10/03/2003    Years since quitting: 17.5   Smokeless tobacco: Never  Vaping Use   Vaping Use: Never used  Substance and Sexual Activity   Alcohol use: No   Drug use: No   Sexual activity: Never  Other Topics Concern   Not on file  Social History Narrative   Not on file   Social Determinants of Health   Financial Resource Strain: Not on file  Food Insecurity: Not on file  Transportation Needs: Not on file  Physical Activity: Not on file  Stress: Not on file  Social Connections: Not on file  Intimate Partner Violence: Not on file     Family History  Problem Relation Age of Onset   Cancer Mother  Hyperlipidemia Father    Heart disease Father    Vascular Disease Father    Arthritis Sister    Colon cancer Sister    Migraines Brother    Alcohol abuse Sister    Dementia Sister    Bipolar disorder Sister    Heart attack Sister        around age 74   Arthritis Sister    Migraines Sister    Breast cancer Neg Hx      Current Outpatient Medications:    apixaban (ELIQUIS) 5 MG TABS tablet, Take by mouth., Disp: , Rfl:    atorvastatin (LIPITOR) 80 MG tablet, TAKE 1 TABLET BY MOUTH  DAILY, Disp: 90 tablet, Rfl: 3   butalbital-acetaminophen-caffeine (FIORICET) 50-325-40 MG tablet, Take 1 tablet by mouth every 4 (four) hours as needed for headache., Disp: 20 tablet, Rfl: 3   carvedilol (COREG) 6.25 MG tablet, Take by mouth., Disp: , Rfl:    clopidogrel (PLAVIX) 75 MG tablet, , Disp: , Rfl:    cyanocobalamin 1000 MCG tablet, Take 1,000 mcg by mouth daily. , Disp: , Rfl:    ezetimibe (ZETIA) 10 MG tablet, ezetimibe 10 mg tablet, Disp: , Rfl:     Ferrous Sulfate Dried 200 (65 Fe) MG TABS, Take by mouth., Disp: , Rfl:    furosemide (LASIX) 20 MG tablet, TAKE 1 TABLET BY MOUTH  DAILY AS NEEDED, Disp: 90 tablet, Rfl: 1   gabapentin (NEURONTIN) 100 MG capsule, TAKE 2 CAPSULES BY MOUTH AT BEDTIME, Disp: 180 capsule, Rfl: 1   loratadine (CLARITIN) 10 MG tablet, Take 10 mg by mouth daily. , Disp: , Rfl:    nitroGLYCERIN (NITROSTAT) 0.4 MG SL tablet, Place 1 tablet (0.4 mg total) under the tongue every 5 (five) minutes as needed for chest pain., Disp: 30 tablet, Rfl: 3   pantoprazole (PROTONIX) 40 MG tablet, TAKE 1 TABLET BY MOUTH IN  THE MORNING, Disp: 90 tablet, Rfl: 3   potassium chloride (KLOR-CON) 10 MEQ tablet, TAKE 1 TABLET BY MOUTH  TWICE DAILY, Disp: 180 tablet, Rfl: 1   promethazine (PHENERGAN) 25 MG tablet, Take 1/2-1 tablet by mouth every 8 hours as needed for nausea., Disp: 30 tablet, Rfl: 1   ENTRESTO 24-26 MG, , Disp: , Rfl:    Physical exam:  Vitals:   04/11/21 1335  BP: (!) 152/57  Pulse: 68  Resp: 18  Temp: 98 F (36.7 C)  TempSrc: Tympanic  SpO2: 100%  Weight: 276 lb (125.2 kg)   Physical Exam Constitutional:      Appearance: She is obese.     Comments: She is sitting in a wheelchair and appears in no acute distress  Cardiovascular:     Rate and Rhythm: Normal rate and regular rhythm.     Heart sounds: Normal heart sounds.  Pulmonary:     Effort: Pulmonary effort is normal.     Breath sounds: Normal breath sounds.  Abdominal:     General: Bowel sounds are normal.     Palpations: Abdomen is soft.     Comments: Obese, distended  Musculoskeletal:     Cervical back: Normal range of motion.     Comments: Erythema and induration noted over the medial aspect of the left calf consistent with superficial thrombophlebitis  Skin:    General: Skin is warm and dry.  Neurological:     Mental Status: She is alert and oriented to person, place, and time.       CMP Latest Ref Rng & Units  04/07/2021  Glucose 65 -  99 mg/dL 128(H)  BUN 8 - 27 mg/dL 14  Creatinine 0.57 - 1.00 mg/dL 0.98  Sodium 134 - 144 mmol/L 142  Potassium 3.5 - 5.2 mmol/L 4.0  Chloride 96 - 106 mmol/L 106  CO2 20 - 29 mmol/L 24  Calcium 8.7 - 10.3 mg/dL 8.5(L)  Total Protein 6.0 - 8.5 g/dL 5.6(L)  Total Bilirubin 0.0 - 1.2 mg/dL 0.4  Alkaline Phos 44 - 121 IU/L 144(H)  AST 0 - 40 IU/L 19  ALT 0 - 32 IU/L 26   CBC Latest Ref Rng & Units 04/07/2021  WBC 3.4 - 10.8 x10E3/uL 7.6  Hemoglobin 11.1 - 15.9 g/dL 10.0(L)  Hematocrit 34.0 - 46.6 % 30.5(L)  Platelets 150 - 450 x10E3/uL 222    No images are attached to the encounter.  No results found.  Assessment and plan- Patient is a 75 y.o. female who has been referred for incidentally found liver lesions on CTA  Liver lesions: Concerning for metastatic disease.  I will have the images from CTA sent to our system.  I will proceed with a CT abdomen and pelvis with contrast to a certain the site of primary.  CTA did not show any evidence of primary lung mass or mediastinal adenopathy.  Based on CT abdomen findings we will decide about what to biopsy which will be a challenge given her recent PE.  She is also on Plavix which will have to be held for 7 days.  We will see if anything other than the liver can be safely biopsied as liver biopsy can be associated with bleeding complications.  History of recent PE: Discussed the results of bilateral Doppler with the patient which did not show any evidence of DVT.  What she has in her left leg is superficial thrombophlebitis.  She will continue Eliquis 5 mg twice daily for now.  It is likely that her PE was provoked due to underlying malignancy and she will need to stay on Eliquis lifelong.  Normocytic anemia: Based on the results of her biopsy I will be doing more blood work in the future and add anemia blood work as well.  Although iron studies were not recently checked her MCV was low normal at 80 and she is currently on oral iron  Video  visit with patient and daughter after CT abdomen findings are back and after discussion at tumor board   Thank you for this kind referral and the opportunity to participate in the care of this patient   Visit Diagnosis 1. Liver metastases ([place])   2. Other acute pulmonary embolism without acute cor pulmonale ([place])     Dr. Randa Evens, MD, MPH [facility] at [facility] [phone] 04/12/2021

## 2021-04-13 ENCOUNTER — Ambulatory Visit
Admission: RE | Admit: 2021-04-13 | Discharge: 2021-04-13 | Disposition: A | Discharge status: Home / Self Care | Payer: Medicare Other | Source: Ambulatory Visit | Attending: Oncology | Admitting: Oncology

## 2021-04-13 ENCOUNTER — Other Ambulatory Visit: Payer: Self-pay

## 2021-04-13 DIAGNOSIS — J9811 Atelectasis: Secondary | ICD-10-CM | POA: Diagnosis not present

## 2021-04-13 DIAGNOSIS — I2510 Atherosclerotic heart disease of native coronary artery without angina pectoris: Secondary | ICD-10-CM | POA: Diagnosis not present

## 2021-04-13 DIAGNOSIS — K8020 Calculus of gallbladder without cholecystitis without obstruction: Secondary | ICD-10-CM | POA: Diagnosis not present

## 2021-04-13 DIAGNOSIS — D7389 Other diseases of spleen: Secondary | ICD-10-CM | POA: Diagnosis not present

## 2021-04-13 DIAGNOSIS — I700 Atherosclerosis of aorta: Secondary | ICD-10-CM | POA: Diagnosis not present

## 2021-04-13 DIAGNOSIS — K8689 Other specified diseases of pancreas: Secondary | ICD-10-CM | POA: Diagnosis not present

## 2021-04-13 DIAGNOSIS — C787 Secondary malignant neoplasm of liver and intrahepatic bile duct: Secondary | ICD-10-CM | POA: Insufficient documentation

## 2021-04-13 DIAGNOSIS — K7689 Other specified diseases of liver: Secondary | ICD-10-CM | POA: Diagnosis not present

## 2021-04-13 MED ORDER — IOHEXOL 350 MG/ML SOLN
100.0000 mL | Freq: Once | INTRAVENOUS | Status: AC | PRN
  Administered 2021-04-13: 100 mL via INTRAVENOUS

## 2021-04-14 ENCOUNTER — Telehealth: Payer: Medicare Other | Admitting: Oncology

## 2021-04-14 ENCOUNTER — Other Ambulatory Visit: Payer: Self-pay | Admitting: *Deleted

## 2021-04-14 ENCOUNTER — Inpatient Hospital Stay (HOSPITAL_BASED_OUTPATIENT_CLINIC_OR_DEPARTMENT_OTHER): Payer: Medicare Other | Admitting: Oncology

## 2021-04-14 DIAGNOSIS — Z7189 Other specified counseling: Secondary | ICD-10-CM

## 2021-04-14 DIAGNOSIS — C787 Secondary malignant neoplasm of liver and intrahepatic bile duct: Secondary | ICD-10-CM

## 2021-04-14 DIAGNOSIS — K8689 Other specified diseases of pancreas: Secondary | ICD-10-CM | POA: Diagnosis not present

## 2021-04-15 ENCOUNTER — Telehealth: Payer: Self-pay | Admitting: *Deleted

## 2021-04-15 NOTE — Telephone Encounter (Signed)
I would suggest to take her to ER given her h/o prior stroke

## 2021-04-15 NOTE — Telephone Encounter (Signed)
We can get an mri brain but it wont be today. She was able to understand what I had to say yesterday but if her mental status has changed best to go to ER

## 2021-04-15 NOTE — Telephone Encounter (Signed)
Patient's daughter called to report that patient was slurring her words and was very confused this morning. She stated that this has been going on for about a week and that she had spoken to Dr. Janese Banks about it yesterday.

## 2021-04-15 NOTE — Telephone Encounter (Signed)
Advised patient's daughter to take patient to emergency room for evaluation.

## 2021-04-17 ENCOUNTER — Emergency Department: Payer: Medicare Other

## 2021-04-17 ENCOUNTER — Encounter: Payer: Self-pay | Admitting: Oncology

## 2021-04-17 ENCOUNTER — Other Ambulatory Visit: Payer: Self-pay

## 2021-04-17 ENCOUNTER — Inpatient Hospital Stay
Admission: EM | Admit: 2021-04-17 | Discharge: 2021-04-19 | DRG: 054 | Disposition: A | Discharge status: Home / Self Care | Payer: Medicare Other | Attending: Internal Medicine | Admitting: Internal Medicine

## 2021-04-17 ENCOUNTER — Telehealth: Payer: Self-pay | Admitting: Internal Medicine

## 2021-04-17 ENCOUNTER — Encounter: Payer: Self-pay | Admitting: Emergency Medicine

## 2021-04-17 DIAGNOSIS — R4182 Altered mental status, unspecified: Secondary | ICD-10-CM

## 2021-04-17 DIAGNOSIS — D630 Anemia in neoplastic disease: Secondary | ICD-10-CM | POA: Diagnosis present

## 2021-04-17 DIAGNOSIS — Z83438 Family history of other disorder of lipoprotein metabolism and other lipidemia: Secondary | ICD-10-CM

## 2021-04-17 DIAGNOSIS — C787 Secondary malignant neoplasm of liver and intrahepatic bile duct: Secondary | ICD-10-CM | POA: Diagnosis present

## 2021-04-17 DIAGNOSIS — C259 Malignant neoplasm of pancreas, unspecified: Secondary | ICD-10-CM | POA: Diagnosis present

## 2021-04-17 DIAGNOSIS — Z79899 Other long term (current) drug therapy: Secondary | ICD-10-CM

## 2021-04-17 DIAGNOSIS — R109 Unspecified abdominal pain: Secondary | ICD-10-CM | POA: Diagnosis not present

## 2021-04-17 DIAGNOSIS — I442 Atrioventricular block, complete: Secondary | ICD-10-CM | POA: Diagnosis present

## 2021-04-17 DIAGNOSIS — Z87891 Personal history of nicotine dependence: Secondary | ICD-10-CM

## 2021-04-17 DIAGNOSIS — I5022 Chronic systolic (congestive) heart failure: Secondary | ICD-10-CM | POA: Diagnosis not present

## 2021-04-17 DIAGNOSIS — K219 Gastro-esophageal reflux disease without esophagitis: Secondary | ICD-10-CM | POA: Diagnosis present

## 2021-04-17 DIAGNOSIS — I803 Phlebitis and thrombophlebitis of lower extremities, unspecified: Secondary | ICD-10-CM | POA: Diagnosis not present

## 2021-04-17 DIAGNOSIS — Z95810 Presence of automatic (implantable) cardiac defibrillator: Secondary | ICD-10-CM | POA: Diagnosis not present

## 2021-04-17 DIAGNOSIS — E119 Type 2 diabetes mellitus without complications: Secondary | ICD-10-CM | POA: Diagnosis present

## 2021-04-17 DIAGNOSIS — R0689 Other abnormalities of breathing: Secondary | ICD-10-CM | POA: Diagnosis not present

## 2021-04-17 DIAGNOSIS — C7931 Secondary malignant neoplasm of brain: Principal | ICD-10-CM | POA: Diagnosis present

## 2021-04-17 DIAGNOSIS — Z515 Encounter for palliative care: Secondary | ICD-10-CM

## 2021-04-17 DIAGNOSIS — G459 Transient cerebral ischemic attack, unspecified: Secondary | ICD-10-CM

## 2021-04-17 DIAGNOSIS — Z91041 Radiographic dye allergy status: Secondary | ICD-10-CM | POA: Diagnosis not present

## 2021-04-17 DIAGNOSIS — Z880 Allergy status to penicillin: Secondary | ICD-10-CM | POA: Diagnosis not present

## 2021-04-17 DIAGNOSIS — G131 Other systemic atrophy primarily affecting central nervous system in neoplastic disease: Secondary | ICD-10-CM | POA: Diagnosis not present

## 2021-04-17 DIAGNOSIS — Z7902 Long term (current) use of antithrombotics/antiplatelets: Secondary | ICD-10-CM

## 2021-04-17 DIAGNOSIS — R55 Syncope and collapse: Secondary | ICD-10-CM | POA: Diagnosis present

## 2021-04-17 DIAGNOSIS — R42 Dizziness and giddiness: Secondary | ICD-10-CM | POA: Diagnosis not present

## 2021-04-17 DIAGNOSIS — Z8249 Family history of ischemic heart disease and other diseases of the circulatory system: Secondary | ICD-10-CM

## 2021-04-17 DIAGNOSIS — Z66 Do not resuscitate: Secondary | ICD-10-CM | POA: Diagnosis not present

## 2021-04-17 DIAGNOSIS — Z888 Allergy status to other drugs, medicaments and biological substances status: Secondary | ICD-10-CM | POA: Diagnosis not present

## 2021-04-17 DIAGNOSIS — G936 Cerebral edema: Secondary | ICD-10-CM | POA: Diagnosis not present

## 2021-04-17 DIAGNOSIS — Z8674 Personal history of sudden cardiac arrest: Secondary | ICD-10-CM | POA: Diagnosis not present

## 2021-04-17 DIAGNOSIS — E669 Obesity, unspecified: Secondary | ICD-10-CM | POA: Diagnosis present

## 2021-04-17 DIAGNOSIS — Z7901 Long term (current) use of anticoagulants: Secondary | ICD-10-CM

## 2021-04-17 DIAGNOSIS — Z20822 Contact with and (suspected) exposure to covid-19: Secondary | ICD-10-CM | POA: Diagnosis not present

## 2021-04-17 DIAGNOSIS — K8689 Other specified diseases of pancreas: Secondary | ICD-10-CM | POA: Diagnosis not present

## 2021-04-17 DIAGNOSIS — I110 Hypertensive heart disease with heart failure: Secondary | ICD-10-CM | POA: Diagnosis not present

## 2021-04-17 DIAGNOSIS — Z743 Need for continuous supervision: Secondary | ICD-10-CM | POA: Diagnosis not present

## 2021-04-17 DIAGNOSIS — Z86711 Personal history of pulmonary embolism: Secondary | ICD-10-CM | POA: Diagnosis not present

## 2021-04-17 DIAGNOSIS — K869 Disease of pancreas, unspecified: Secondary | ICD-10-CM | POA: Diagnosis not present

## 2021-04-17 DIAGNOSIS — E7800 Pure hypercholesterolemia, unspecified: Secondary | ICD-10-CM

## 2021-04-17 DIAGNOSIS — Z8673 Personal history of transient ischemic attack (TIA), and cerebral infarction without residual deficits: Secondary | ICD-10-CM

## 2021-04-17 DIAGNOSIS — I10 Essential (primary) hypertension: Secondary | ICD-10-CM

## 2021-04-17 DIAGNOSIS — J9811 Atelectasis: Secondary | ICD-10-CM | POA: Diagnosis not present

## 2021-04-17 DIAGNOSIS — Z800 Family history of malignant neoplasm of digestive organs: Secondary | ICD-10-CM

## 2021-04-17 DIAGNOSIS — R404 Transient alteration of awareness: Secondary | ICD-10-CM | POA: Diagnosis not present

## 2021-04-17 DIAGNOSIS — R531 Weakness: Secondary | ICD-10-CM | POA: Diagnosis not present

## 2021-04-17 HISTORY — DX: Malignant neoplasm of pancreas, unspecified: C25.9

## 2021-04-17 LAB — HEMOGLOBIN A1C
Hgb A1c MFr Bld: 6 % — ABNORMAL HIGH (ref 4.8–5.6)
Mean Plasma Glucose: 125.5 mg/dL

## 2021-04-17 LAB — URINALYSIS, COMPLETE (UACMP) WITH MICROSCOPIC
Bacteria, UA: NONE SEEN
Bilirubin Urine: NEGATIVE
Glucose, UA: NEGATIVE mg/dL
Hgb urine dipstick: NEGATIVE
Ketones, ur: NEGATIVE mg/dL
Nitrite: NEGATIVE
Protein, ur: 30 mg/dL — AB
Specific Gravity, Urine: 1.025 (ref 1.005–1.030)
pH: 5 (ref 5.0–8.0)

## 2021-04-17 LAB — CBC
HCT: 31.6 % — ABNORMAL LOW (ref 36.0–46.0)
Hemoglobin: 10.1 g/dL — ABNORMAL LOW (ref 12.0–15.0)
MCH: 26.9 pg (ref 26.0–34.0)
MCHC: 32 g/dL (ref 30.0–36.0)
MCV: 84 fL (ref 80.0–100.0)
Platelets: 150 10*3/uL (ref 150–400)
RBC: 3.76 MIL/uL — ABNORMAL LOW (ref 3.87–5.11)
RDW: 14.6 % (ref 11.5–15.5)
WBC: 10 10*3/uL (ref 4.0–10.5)
nRBC: 0 % (ref 0.0–0.2)

## 2021-04-17 LAB — COMPREHENSIVE METABOLIC PANEL
ALT: 36 U/L (ref 0–44)
AST: 35 U/L (ref 15–41)
Albumin: 3 g/dL — ABNORMAL LOW (ref 3.5–5.0)
Alkaline Phosphatase: 119 U/L (ref 38–126)
Anion gap: 7 (ref 5–15)
BUN: 17 mg/dL (ref 8–23)
CO2: 28 mmol/L (ref 22–32)
Calcium: 8.6 mg/dL — ABNORMAL LOW (ref 8.9–10.3)
Chloride: 102 mmol/L (ref 98–111)
Creatinine, Ser: 0.99 mg/dL (ref 0.44–1.00)
GFR, Estimated: 59 mL/min — ABNORMAL LOW (ref >60)
Glucose, Bld: 123 mg/dL — ABNORMAL HIGH (ref 70–99)
Potassium: 3.6 mmol/L (ref 3.5–5.1)
Sodium: 137 mmol/L (ref 135–145)
Total Bilirubin: 0.8 mg/dL (ref 0.3–1.2)
Total Protein: 6.2 g/dL — ABNORMAL LOW (ref 6.5–8.1)

## 2021-04-17 LAB — GLUCOSE, CAPILLARY: Glucose-Capillary: 140 mg/dL — ABNORMAL HIGH (ref 70–99)

## 2021-04-17 LAB — CBG MONITORING, ED: Glucose-Capillary: 106 mg/dL — ABNORMAL HIGH (ref 70–99)

## 2021-04-17 LAB — RESP PANEL BY RT-PCR (FLU A&B, COVID) ARPGX2
Influenza A by PCR: NEGATIVE
Influenza B by PCR: NEGATIVE
SARS Coronavirus 2 by RT PCR: NEGATIVE

## 2021-04-17 LAB — LIPASE, BLOOD: Lipase: 28 U/L (ref 11–51)

## 2021-04-17 LAB — TSH: TSH: 1.52 u[IU]/mL (ref 0.350–4.500)

## 2021-04-17 LAB — PROCALCITONIN: Procalcitonin: <0.1 ng/mL

## 2021-04-17 MED ORDER — VITAMIN B-12 1000 MCG PO TABS
1000.0000 ug | ORAL_TABLET | Freq: Every day | ORAL | Status: DC
  Administered 2021-04-18 – 2021-04-19 (×2): 1000 ug via ORAL
  Filled 2021-04-17 (×3): qty 1

## 2021-04-17 MED ORDER — PANTOPRAZOLE SODIUM 40 MG PO TBEC
40.0000 mg | DELAYED_RELEASE_TABLET | Freq: Every morning | ORAL | Status: DC
  Administered 2021-04-18 – 2021-04-19 (×2): 40 mg via ORAL
  Filled 2021-04-17: qty 1

## 2021-04-17 MED ORDER — ACETAMINOPHEN 325 MG PO TABS: 650.0000 mg | ORAL_TABLET | Freq: Four times a day (QID) | ORAL | Status: DC | PRN

## 2021-04-17 MED ORDER — INSULIN ASPART 100 UNIT/ML IJ SOLN: 0.0000 [IU] | Freq: Every day | INTRAMUSCULAR | Status: DC

## 2021-04-17 MED ORDER — NITROGLYCERIN 0.4 MG SL SUBL: 0.4000 mg | SUBLINGUAL_TABLET | SUBLINGUAL | Status: DC | PRN

## 2021-04-17 MED ORDER — ONDANSETRON HCL 4 MG/2ML IJ SOLN: 4.0000 mg | Freq: Four times a day (QID) | INTRAMUSCULAR | Status: DC | PRN

## 2021-04-17 MED ORDER — EZETIMIBE 10 MG PO TABS
10.0000 mg | ORAL_TABLET | Freq: Every day | ORAL | Status: DC
  Administered 2021-04-18 – 2021-04-19 (×2): 10 mg via ORAL
  Filled 2021-04-17 (×2): qty 1

## 2021-04-17 MED ORDER — ENOXAPARIN SODIUM 80 MG/0.8ML IJ SOSY
0.5000 mg/kg | PREFILLED_SYRINGE | INTRAMUSCULAR | Status: DC
  Filled 2021-04-17: qty 0.63

## 2021-04-17 MED ORDER — APIXABAN 5 MG PO TABS
5.0000 mg | ORAL_TABLET | Freq: Two times a day (BID) | ORAL | Status: DC
  Administered 2021-04-17 – 2021-04-19 (×4): 5 mg via ORAL
  Filled 2021-04-17 (×4): qty 1

## 2021-04-17 MED ORDER — LORAZEPAM 2 MG/ML IJ SOLN: 2.0000 mg | INTRAMUSCULAR | Status: DC | PRN

## 2021-04-17 MED ORDER — ONDANSETRON HCL 4 MG PO TABS: 4.0000 mg | ORAL_TABLET | Freq: Four times a day (QID) | ORAL | Status: DC | PRN

## 2021-04-17 MED ORDER — FENTANYL CITRATE (PF) 100 MCG/2ML IJ SOLN: 12.5000 ug | INTRAMUSCULAR | Status: AC | PRN

## 2021-04-17 MED ORDER — GABAPENTIN 100 MG PO CAPS
100.0000 mg | ORAL_CAPSULE | Freq: Two times a day (BID) | ORAL | Status: DC
  Administered 2021-04-17 – 2021-04-19 (×4): 100 mg via ORAL
  Filled 2021-04-17 (×4): qty 1

## 2021-04-17 MED ORDER — DEXAMETHASONE SODIUM PHOSPHATE 4 MG/ML IJ SOLN
4.0000 mg | Freq: Once | INTRAMUSCULAR | Status: AC
  Administered 2021-04-17: 4 mg via INTRAVENOUS
  Filled 2021-04-17: qty 1

## 2021-04-17 MED ORDER — BUTALBITAL-APAP-CAFFEINE 50-325-40 MG PO TABS
1.0000 | ORAL_TABLET | ORAL | Status: DC | PRN
  Administered 2021-04-18 (×2): 1 via ORAL
  Filled 2021-04-17 (×2): qty 1

## 2021-04-17 MED ORDER — TRAMADOL HCL 50 MG PO TABS: 25.0000 mg | ORAL_TABLET | Freq: Four times a day (QID) | ORAL | Status: DC | PRN

## 2021-04-17 MED ORDER — CLOPIDOGREL BISULFATE 75 MG PO TABS
75.0000 mg | ORAL_TABLET | Freq: Every day | ORAL | Status: DC
  Administered 2021-04-18 – 2021-04-19 (×2): 75 mg via ORAL
  Filled 2021-04-17 (×2): qty 1

## 2021-04-17 MED ORDER — INSULIN ASPART 100 UNIT/ML IJ SOLN
0.0000 [IU] | Freq: Three times a day (TID) | INTRAMUSCULAR | Status: DC
  Administered 2021-04-18: 2 [IU] via SUBCUTANEOUS
  Administered 2021-04-18: 5 [IU] via SUBCUTANEOUS
  Administered 2021-04-19 (×2): 2 [IU] via SUBCUTANEOUS
  Filled 2021-04-17 (×4): qty 1

## 2021-04-17 MED ORDER — MORPHINE SULFATE (PF) 2 MG/ML IV SOLN: 1.0000 mg | INTRAVENOUS | Status: AC | PRN

## 2021-04-17 MED ORDER — ACETAMINOPHEN 650 MG RE SUPP: 650.0000 mg | Freq: Four times a day (QID) | RECTAL | Status: DC | PRN

## 2021-04-17 MED ORDER — ATORVASTATIN CALCIUM 20 MG PO TABS
80.0000 mg | ORAL_TABLET | Freq: Every day | ORAL | Status: DC
  Administered 2021-04-18 – 2021-04-19 (×2): 80 mg via ORAL
  Filled 2021-04-17 (×2): qty 4

## 2021-04-17 MED ORDER — LORATADINE 10 MG PO TABS
10.0000 mg | ORAL_TABLET | Freq: Every day | ORAL | Status: DC
  Administered 2021-04-18 – 2021-04-19 (×2): 10 mg via ORAL
  Filled 2021-04-17 (×2): qty 1

## 2021-04-17 MED ORDER — TRAMADOL HCL 50 MG PO TABS: ORAL_TABLET | ORAL | 0 refills | Status: DC

## 2021-04-17 MED ORDER — DEXAMETHASONE SODIUM PHOSPHATE 4 MG/ML IJ SOLN: 4.0000 mg | INTRAMUSCULAR | Status: DC

## 2021-04-17 MED ORDER — DEXAMETHASONE SODIUM PHOSPHATE 4 MG/ML IJ SOLN
4.0000 mg | Freq: Four times a day (QID) | INTRAMUSCULAR | Status: DC
  Administered 2021-04-17 – 2021-04-19 (×7): 4 mg via INTRAVENOUS
  Filled 2021-04-17 (×7): qty 1

## 2021-04-17 MED ORDER — CARVEDILOL 6.25 MG PO TABS
6.2500 mg | ORAL_TABLET | Freq: Two times a day (BID) | ORAL | Status: DC
  Administered 2021-04-18 – 2021-04-19 (×2): 6.25 mg via ORAL
  Filled 2021-04-17 (×2): qty 1

## 2021-04-17 MED ORDER — SODIUM CHLORIDE 0.9 % IV BOLUS
500.0000 mL | Freq: Once | INTRAVENOUS | Status: AC
  Administered 2021-04-17: 500 mL via INTRAVENOUS

## 2021-04-17 NOTE — ED Notes (Signed)
Request made for transport  

## 2021-04-17 NOTE — ED Provider Notes (Signed)
[facility] Emergency Department Provider Note   ____________________________________________    I have reviewed the triage vital signs and the nursing notes.   HISTORY  Chief Complaint Abdominal Pain     HPI Anna Marsh is a 75 y.o. female with a history of CHF, recently with CT scan most consistent with metastatic pancreatic cancer however not biopsied at this time.  Following with oncology.  Here today with gradually worsening confusion.  History is primarily per daughter.  She notes over the last several days patient has become less herself, with very little energy not wanting to get of bed and significant confusion.  No dysuria or foul-smelling urine reported.  No fevers chills or cough.  Has had chronic abdominal pain since diagnosis  Past Medical History:  Diagnosis Date   AICD (automatic cardioverter/defibrillator) present    Anemia    Anxiety    AV block, complete (HCC)    Cervical radiculopathy    CHF (congestive heart failure) (HCC)    Chronic systolic heart failure (HCC)    GERD (gastroesophageal reflux disease)    History of cardiac arrest    Hypercholesteremia    Hypertension    Pancreatic cancer ([place])    stage 4   Pre-diabetes    Presence of permanent cardiac pacemaker 02/03/2015   [facility]  with AICD   Right arm weakness     Patient Active Problem List   Diagnosis Date Noted   Paroxysmal atrial fibrillation ([place]) 07/26/2018   Right cavernous carotid stenosis 07/16/2018   Ischemic stroke ([place]) 07/16/2018   TIA (transient ischemic attack) 11/07/2016   Pre-diabetes 07/20/2015   Acute anxiety 07/20/2015   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   GERD (gastroesophageal reflux disease) 02/28/2015   Hypercholesterolemia 02/28/2015   Hypertension 02/28/2015   Cardiac defibrillator in place 02/28/2015   Block, bundle branch, left 02/28/2015   Adiposity 02/28/2015   Acid reflux 02/28/2015   Complete atrioventricular  block (HCC) 74/09/8785   Chronic systolic CHF (congestive heart failure), NYHA class 3 ([place]) 02/24/2015   H/O cardiac catheterization 02/24/2015   Combined fat and carbohydrate induced hyperlipemia 02/15/2015   Cardiomyopathy ([place]) 02/01/2015   Angina pectoris ([place]) 12/25/2013   Arthritis 12/25/2013   Hallux abducto valgus 12/25/2013    Past Surgical History:  Procedure Laterality Date   ABDOMINAL HYSTERECTOMY     CATARACT EXTRACTION W/PHACO Left 12/18/2016   Procedure: CATARACT EXTRACTION PHACO AND INTRAOCULAR LENS PLACEMENT ([place])  left;  Surgeon: Ronnell Freshwater, MD;  Location: [place];  Service: Ophthalmology;  Laterality: Left;   CATARACT EXTRACTION W/PHACO Right 01/01/2017   Procedure: CATARACT EXTRACTION PHACO AND INTRAOCULAR LENS PLACEMENT ([place])  Right;  Surgeon: Ronnell Freshwater, MD;  Location: [place];  Service: Ophthalmology;  Laterality: Right;   HERNIA REPAIR     umbilical x 2   NECK SURGERY     PACEMAKER IMPLANT  02/03/2015   [facility]   TONSILLECTOMY      Prior to Admission medications   Medication Sig Start Date End Date Taking? Authorizing Provider  apixaban (ELIQUIS) 5 MG TABS tablet Take by mouth. 04/03/21 06/07/21  [provider]  atorvastatin (LIPITOR) 80 MG tablet TAKE 1 TABLET BY MOUTH  DAILY 09/20/20   Jerrol Banana., MD  butalbital-acetaminophen-caffeine [facility]) [phone] MG tablet Take 1 tablet by mouth every 4 (four) hours as needed for headache. 06/27/19   Jerrol Banana., MD  carvedilol (COREG) 6.25 MG tablet  Take by mouth. 09/12/18 04/11/21  [provider]  clopidogrel (PLAVIX) 75 MG tablet  02/25/19   [provider]  cyanocobalamin 1000 MCG tablet Take 1,000 mcg by mouth daily.     [provider]  ENTRESTO 24-26 MG  03/11/19   [provider]  ezetimibe (ZETIA) 10 MG tablet ezetimibe 10 mg tablet 07/29/20   [provider]  Ferrous Sulfate Dried 200 (65  Fe) MG TABS Take by mouth.    [provider]  furosemide (LASIX) 20 MG tablet TAKE 1 TABLET BY MOUTH  DAILY AS NEEDED 11/07/20   Jerrol Banana., MD  gabapentin (NEURONTIN) 100 MG capsule TAKE 2 CAPSULES BY MOUTH AT BEDTIME 03/22/21   Jerrol Banana., MD  loratadine (CLARITIN) 10 MG tablet Take 10 mg by mouth daily.     [provider]  nitroGLYCERIN (NITROSTAT) 0.4 MG SL tablet Place 1 tablet (0.4 mg total) under the tongue every 5 (five) minutes as needed for chest pain. 12/05/16 04/11/21  Jerrol Banana., MD  pantoprazole (PROTONIX) 40 MG tablet TAKE 1 TABLET BY MOUTH IN  THE MORNING 07/06/20   Jerrol Banana., MD  potassium chloride (KLOR-CON) 10 MEQ tablet TAKE 1 TABLET BY MOUTH  TWICE DAILY 11/04/20   Jerrol Banana., MD  promethazine (PHENERGAN) 25 MG tablet Take 1/2-1 tablet by mouth every 8 hours as needed for nausea. 06/26/19   Jerrol Banana., MD  traMADol Veatrice Bourbon) 50 MG tablet 1/2 to 1 pill every 6-8 hours for pain. 04/17/21   Cammie Sickle, MD     Allergies Iodinated diagnostic agents, Iodine, Lisinopril, and Penicillins  Family History  Problem Relation Age of Onset   Cancer Mother    Hyperlipidemia Father    Heart disease Father    Vascular Disease Father    Arthritis Sister    Colon cancer Sister    Migraines Brother    Alcohol abuse Sister    Dementia Sister    Bipolar disorder Sister    Heart attack Sister        around age 38   Arthritis Sister    Migraines Sister    Breast cancer Neg Hx     Social History Social History   Tobacco Use   Smoking status: Former    Packs/day: 0.50    Years: 20.00    Pack years: 10.00    Types: Cigarettes    Quit date: 10/03/2003    Years since quitting: 17.5   Smokeless tobacco: Never  Vaping Use   Vaping Use: Never used  Substance Use Topics   Alcohol use: No   Drug use: No    Review of Systems  Constitutional: No fever/chills Eyes: No visual changes.   ENT: No sore throat. Cardiovascular: Denies chest pain. Respiratory: Denies shortness of breath. Gastrointestinal: As above Genitourinary: Negative for dysuria. Musculoskeletal: Negative for back pain. Skin: Negative for rash. Neurological: As above   ____________________________________________   PHYSICAL EXAM:  VITAL SIGNS: ED Triage Vitals [04/17/21 1240]  Enc Vitals Group     BP (!) 127/50     Pulse Rate 70     Resp 18     Temp 98.2 F (36.8 C)     Temp Source Oral     SpO2 95 %     Weight 125.2 kg (276 lb 0.3 oz)     Height 1.6 m (5\' 3" )     Head Circumference  Peak Flow      Pain Score 10     Pain Loc      Pain Edu?      Excl. in [place]?     Constitutional: Alert but slow to respond, no acute Eyes: Conjunctivae are normal.  Head: Atraumatic. Nose: No congestion/rhinnorhea. Mouth/Throat: Mucous membranes are moist.    Cardiovascular: Normal rate, regular rhythm. Grossly normal heart sounds.  Good peripheral circulation. Respiratory: Normal respiratory effort.  No retractions. Lungs CTAB. Gastrointestinal: Soft, no distention, no CVA tenderness  Musculoskeletal: No lower extremity tenderness nor edema.  Warm and well perfused Neurologic:  Normal speech and language. No gross focal neurologic deficits are appreciated.  Normal strength in all extremities,  Skin:  Skin is warm, dry and intact. No rash noted. Psychiatric: Mood and affect are normal. Speech and behavior are normal.  ____________________________________________   LABS (all labs ordered are listed, but only abnormal results are displayed)  Labs Reviewed  COMPREHENSIVE METABOLIC PANEL - Abnormal; Notable for the following components:      Result Value   Glucose, Bld 123 (*)    Calcium 8.6 (*)    Total Protein 6.2 (*)    Albumin 3.0 (*)    GFR, Estimated 59 (*)    All other components within normal limits  CBC - Abnormal; Notable for the following components:   RBC 3.76 (*)    Hemoglobin  10.1 (*)    HCT 31.6 (*)    All other components within normal limits  URINALYSIS, COMPLETE (UACMP) WITH MICROSCOPIC - Abnormal; Notable for the following components:   Color, Urine AMBER (*)    APPearance TURBID (*)    Protein, ur 30 (*)    Leukocytes,Ua SMALL (*)    All other components within normal limits  RESP PANEL BY RT-PCR (FLU A&B, COVID) ARPGX2  LIPASE, BLOOD   ____________________________________________  EKG  None ____________________________________________  RADIOLOGY  Chest x-ray reviewed by me, no infiltrate or effusion ____________________________________________   PROCEDURES  Procedure(s) performed: No  Procedures   Critical Care performed: yes  CRITICAL CARE Performed by: Lavonia Drafts   Total critical care time: 30minutes  Critical care time was exclusive of separately billable procedures and treating other patients.  Critical care was necessary to treat or prevent imminent or life-threatening deterioration.  Critical care was time spent personally by me on the following activities: development of treatment plan with patient and/or surrogate as well as nursing, discussions with consultants, evaluation of patient's response to treatment, examination of patient, obtaining history from patient or surrogate, ordering and performing treatments and interventions, ordering and review of laboratory studies, ordering and review of radiographic studies, pulse oximetry and re-evaluation of patient's condition.  ____________________________________________   INITIAL IMPRESSION / ASSESSMENT AND PLAN / ED COURSE  Pertinent labs & imaging results that were available during my care of the patient were reviewed by me and considered in my medical decision making (see chart for details).   Patient presents with increased weakness and confusion.  Differential includes metabolic encephalopathy, given recent history of likely metastatic pancreatic cancer, brain mets  are a possibility as well.  Will check urine, chest x-ray, COVID, CT head labs and reevaluate  Lab work is overall reassuring, urinalysis not consistent with UTI.  COVID-negative  Fortunately CT head is most consistent with brain mets  Discussed with Dr. Rogue Bussing of oncology who recommends 4 mg dexamethasone every 4 hours  Will discuss with the hospitalist for admission    ____________________________________________  FINAL CLINICAL IMPRESSION(S) / ED DIAGNOSES  Final diagnoses:  Brain metastases [facility])        Note:  This document was prepared using Dragon voice recognition software and may include unintentional dictation errors.    Lavonia Drafts, MD 04/17/21 [phone]

## 2021-04-17 NOTE — Telephone Encounter (Signed)
On 7/17- p's daughter called re: worsening abdominal pain/more confusion over the weekend.   Recommend go to ER- for further eval [infection/strok etc]- daughter declines re: wait tiimes/etc.  As per the request- will send a script for tramadol. Daughter aware of potential side effects of tramadol- including dizzynes/ risk of falls etc. States that pt lives with her; and "someone is watching her all the time".  Per daughter's request- recommend eva in [facility] on 7/18.  [facility]- please follow up in AM. Thanks

## 2021-04-17 NOTE — H&P (Signed)
Due to History and Physical   Anna Marsh MOL:[id number] DOB: 13-Sep-1946 DOA: 04/17/2021  PCP: Jerrol Banana., MD  Outpatient Specialists: Dr. Rogue Bussing, medical oncology Patient coming from: home via EMS  I have personally briefly reviewed patient's old medical records in [place].  Chief Concern: Altered mental status  HPI: Anna Marsh is a 75 y.o. female with medical history significant for GERD, hyperlipidemia, hypertension, presents to the emergency department for chief concerns of confusion.  [name], is daughter who called EMS because patient was confused.  Specifically, [name] states the patient had intermittent confusion of phone use, for example, patient couldn't play solitare on tablet, couldn't tear toilet paper off the roll and then several moments later the symptoms improved.  Additionally, patient asked her grandson where she is is.  Additionally, patient has had increased sleepiness since Friday.   Family denies fever, head trauma, trauma to her person, vomiting.  Ms. Janney endorses abdominal pain, that is intermittent, dull, generalized abdominal pain radiating to her back.   She also had diarrhea on Thursday, 7/14 that was black watery stool since on iron tablets.  The diarrhea has since resolved.  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 She formerly smoked tobacco, 1/2 ppd, quit in 2005. She denies etoh and recreational drug use.   Vaccination history: 3 doses of Pfizer vaccines  ROS: Constitutional: no weight change, no fever ENT/Mouth: no sore throat, no rhinorrhea Eyes: no eye pain, no vision changes Cardiovascular: no chest pain, no dyspnea,  no edema, no palpitations Respiratory: no cough, no sputum, no wheezing Gastrointestinal: no nausea, no vomiting, + diarrhea, no constipation Genitourinary: no urinary incontinence, no dysuria, no hematuria Musculoskeletal: no arthralgias, no myalgias Skin: no  skin lesions, no pruritus, Neuro: + weakness, no loss of consciousness, no syncope Psych: no anxiety, no depression, + decrease appetite Heme/Lymph: no bruising, no bleeding  ED Course: Discussed with EDP, patient requiring hospitalization for altered mental status with possible new brain metastasis.  Vitals in the emergency department was remarkable for temperature of 98.2, respiration rate of 18, heart rate 70, blood pressure 127/50.  SPO2 was 95% on room air.  Labs in the emergency department was remarkable for WBC of 10, hemoglobin 10.1, platelets 150, nonfasting blood glucose 123, BUN 17, serum creatinine of 0.99, EGFR 59.  EDP ordered 1 dose of Decadron 4 mg IV.  Assessment/Plan  Principal Problem:   Altered mental status Active Problems:   GERD (gastroesophageal reflux disease)   Hypercholesterolemia   Hypertension   Cardiac defibrillator in place   Adiposity   Chronic systolic CHF (congestive heart failure), NYHA class 3 (HCC)   Acid reflux   TIA (transient ischemic attack)   Brain metastases (HCC)   Pancreatic mass   # Altered mentation suspect secondary to brain metastasis - Recent diagnosis of pancreatic mass in the tail, on 04/13/2021 - Patient has not received pancreatic biopsy at this time - Dr. Rogue Bussing recommended Decadron 4 mg IV every 6 hours - I will check TSH, B12, procalcitonin - Seizure precautions, Ativan 2 mg IV as needed every 4 hours for seizure activity, 2 days ordered - Palliative care consulted, Dr. Rogue Bussing spoke with Merrily Pew from palliative and states that Merrily Pew, NP will see the patient tomorrow - Spiritual care consulted - Pain control morphine 1 mg IV every 4 hours as needed for moderate pain ordered, fentanyl 12.5 mcg IV every 2 hours as needed for severe pain, 1 day ordered  #  History of right pulmonary embolism-Eliquis 5 mg twice daily resumed # GERD-PPI resumed # Anticipate hyperglycemia in setting of Decadron use as above #  Non-insulin-dependent diabetes mellitus-insulin SSI with at bedtime coverage ordered  # Hyperlipidemia-atorvastatin 80 mg nightly, ezetimibe 10 mg daily resumed # Hypertension-carvedilol 6.25 mg twice daily resumed # Status post cardiac defibrillator # History of TIA-Plavix daily resumed  Chart reviewed.   DVT prophylaxis: Enoxaparin weight-based every 24 hours Code Status: DNR Diet: Heart healthy Family Communication: [name], daughter at bedside  Disposition Plan: Pending clinical course, palliative consulted Consults called: Medical oncology, palliative care, spiritual care Admission status: MedSurg, observation no telemetry  Past Medical History:  Diagnosis Date   AICD (automatic cardioverter/defibrillator) present    Anemia    Anxiety    AV block, complete (HCC)    Cervical radiculopathy    CHF (congestive heart failure) (HCC)    Chronic systolic heart failure (HCC)    GERD (gastroesophageal reflux disease)    History of cardiac arrest    Hypercholesteremia    Hypertension    Pancreatic cancer ([place])    stage 4   Pre-diabetes    Presence of permanent cardiac pacemaker 02/03/2015   [facility]  with AICD   Right arm weakness    Past Surgical History:  Procedure Laterality Date   ABDOMINAL HYSTERECTOMY     CATARACT EXTRACTION W/PHACO Left 12/18/2016   Procedure: CATARACT EXTRACTION PHACO AND INTRAOCULAR LENS PLACEMENT ([place])  left;  Surgeon: Ronnell Freshwater, MD;  Location: [facility];  Service: Ophthalmology;  Laterality: Left;   CATARACT EXTRACTION W/PHACO Right 01/01/2017   Procedure: CATARACT EXTRACTION PHACO AND INTRAOCULAR LENS PLACEMENT ([place])  Right;  Surgeon: Ronnell Freshwater, MD;  Location: [facility];  Service: Ophthalmology;  Laterality: Right;   HERNIA REPAIR     umbilical x 2   NECK SURGERY     PACEMAKER IMPLANT  02/03/2015   [facility]   TONSILLECTOMY     Social History:  reports that she quit smoking about 17 years ago. Her smoking  use included cigarettes. She has a 10.00 pack-year smoking history. She has never used smokeless tobacco. She reports that she does not drink alcohol and does not use drugs.  Allergies  Allergen Reactions   Iodinated Diagnostic Agents Other (See Comments)    Causes migraines   Iodine     Contrast Dye causes migraines   Lisinopril Cough   Penicillins Rash    Has patient had a PCN reaction causing immediate rash, facial/tongue/throat swelling, SOB or lightheadedness with hypotension: Yes Has patient had a PCN reaction causing severe rash involving mucus membranes or skin necrosis: No Has patient had a PCN reaction that required hospitalization: No Has patient had a PCN reaction occurring within the last 10 years: Yes If all of the above answers are "NO", then may proceed with Cephalosporin use.   Family History  Problem Relation Age of Onset   Cancer Mother    Hyperlipidemia Father    Heart disease Father    Vascular Disease Father    Arthritis Sister    Colon cancer Sister    Migraines Brother    Alcohol abuse Sister    Dementia Sister    Bipolar disorder Sister    Heart attack Sister        around age 37   Arthritis Sister    Migraines Sister    Breast cancer Neg Hx    Family history: Family history reviewed and not pertinent  Prior  to Admission medications   Medication Sig Start Date End Date Taking? Authorizing Provider  apixaban (ELIQUIS) 5 MG TABS tablet Take by mouth. 04/03/21 06/07/21  [provider]  atorvastatin (LIPITOR) 80 MG tablet TAKE 1 TABLET BY MOUTH  DAILY 09/20/20   Jerrol Banana., MD  butalbital-acetaminophen-caffeine [facility]) [phone] MG tablet Take 1 tablet by mouth every 4 (four) hours as needed for headache. 06/27/19   Jerrol Banana., MD  carvedilol (COREG) 6.25 MG tablet Take by mouth. 09/12/18 04/11/21  [provider]  clopidogrel (PLAVIX) 75 MG tablet  02/25/19   [provider]  cyanocobalamin 1000 MCG  tablet Take 1,000 mcg by mouth daily.     [provider]  ENTRESTO 24-26 MG  03/11/19   [provider]  ezetimibe (ZETIA) 10 MG tablet ezetimibe 10 mg tablet 07/29/20   [provider]  Ferrous Sulfate Dried 200 (65 Fe) MG TABS Take by mouth.    [provider]  furosemide (LASIX) 20 MG tablet TAKE 1 TABLET BY MOUTH  DAILY AS NEEDED 11/07/20   Jerrol Banana., MD  gabapentin (NEURONTIN) 100 MG capsule TAKE 2 CAPSULES BY MOUTH AT BEDTIME 03/22/21   Jerrol Banana., MD  loratadine (CLARITIN) 10 MG tablet Take 10 mg by mouth daily.     [provider]  nitroGLYCERIN (NITROSTAT) 0.4 MG SL tablet Place 1 tablet (0.4 mg total) under the tongue every 5 (five) minutes as needed for chest pain. 12/05/16 04/11/21  Jerrol Banana., MD  pantoprazole (PROTONIX) 40 MG tablet TAKE 1 TABLET BY MOUTH IN  THE MORNING 07/06/20   Jerrol Banana., MD  potassium chloride (KLOR-CON) 10 MEQ tablet TAKE 1 TABLET BY MOUTH  TWICE DAILY 11/04/20   Jerrol Banana., MD  promethazine (PHENERGAN) 25 MG tablet Take 1/2-1 tablet by mouth every 8 hours as needed for nausea. 06/26/19   Jerrol Banana., MD  traMADol Veatrice Bourbon) 50 MG tablet 1/2 to 1 pill every 6-8 hours for pain. 04/17/21   Cammie Sickle, MD   Physical Exam: Vitals:   04/17/21 1240 04/17/21 1530 04/17/21 1600 04/17/21 1653  BP: (!) 127/50 (!) 136/52 (!) 122/52 (!) 154/70  Pulse: 70 69 67 70  Resp: _0 Temp: 98.2 F (36.8 C)     TempSrc: Oral     SpO2: 95% 98% 99% 100%  Weight: 125.2 kg     Height: 5' 3" (1.6 m)      Constitutional: appears age-appropriate, NAD, calm, comfortable Eyes: PERRL, lids and conjunctivae normal ENMT: Mucous membranes are moist. Posterior pharynx clear of any exudate or lesions. Age-appropriate dentition. Hearing appropriate Neck: normal, supple, no masses, no thyromegaly Respiratory: clear to auscultation bilaterally, no wheezing, no crackles.  Normal respiratory effort. No accessory muscle use.  Cardiovascular: Regular rate and rhythm, no murmurs / rubs / gallops. No extremity edema. 2+ pedal pulses. No carotid bruits.  Abdomen: Obese abdomen, no tenderness, no masses palpated, no hepatosplenomegaly. Bowel sounds positive.  Musculoskeletal: no clubbing / cyanosis. No joint deformity upper and lower extremities. Good ROM, no contractures, no atrophy. Normal muscle tone.  Skin: no rashes, lesions, ulcers. No induration Neurologic: Sensation intact. Strength 5/5 in all 4.  Psychiatric: Normal judgment and insight. Alert and oriented x 3. Normal mood.   EKG: Not indicated at this time  Chest x-ray on Admission: I personally reviewed and I agree with radiologist reading as below.  CT Head Wo Contrast  Result Date: 04/17/2021 CLINICAL DATA:  Mental status change EXAM: CT HEAD WITHOUT CONTRAST TECHNIQUE: Contiguous axial images were obtained from the base of the skull through the vertex without intravenous contrast. COMPARISON:  CT brain 07/04/2018 FINDINGS: Brain: No hemorrhage is visualized. Hypodensity/edema within the left cerebellum and right posterior frontal subcortical white matter. No midline shift. Stable ventricle size. Vascular: No hyperdense vessels. Scattered carotid vascular calcification Skull: Normal. Negative for fracture or focal lesion. Sinuses/Orbits: No acute finding. Other: None IMPRESSION: 1. Focal hypodensities/areas of edema within the right frontal lobe white matter and the left cerebellum, favored to represent neoplasm/metastatic disease over infarcts, given findings on recent body imaging. Negative for acute intracranial hemorrhage. Electronically Signed   By: Donavan Foil M.D.   On: 04/17/2021 16:33   DG Chest Port 1 View  Result Date: 04/17/2021 CLINICAL DATA:  Weakness.  Generalized abdominal pain. EXAM: PORTABLE CHEST 1 VIEW COMPARISON:  Radiograph 12/05/2016.  CT 04/13/2021 FINDINGS: Left-sided pacemaker in  place. Normal heart size with stable mediastinal contours aortic atherosclerosis. Elevated right hemidiaphragm with adjacent compressive atelectasis. No acute airspace disease. No pulmonary edema or pleural effusion. No acute osseous abnormalities are seen. Presumed external artifact projects over the left upper thorax. IMPRESSION: No acute chest finding. Elevated right hemidiaphragm with adjacent compressive atelectasis. Electronically Signed   By: Keith Rake M.D.   On: 04/17/2021 16:15    Labs on Admission: I have personally reviewed following labs  CBC: Recent Labs  Lab 04/17/21 1246  WBC 10.0  HGB 10.1*  HCT 31.6*  MCV 84.0  PLT 500   Basic Metabolic Panel: Recent Labs  Lab 04/17/21 1246  NA 137  K 3.6  CL 102  CO2 28  GLUCOSE 123*  BUN 17  CREATININE 0.99  CALCIUM 8.6*   GFR: Estimated Creatinine Clearance: 63.2 mL/min (by C-G formula based on SCr of 0.99 mg/dL).  Liver Function Tests: Recent Labs  Lab 04/17/21 1246  AST 35  ALT 36  ALKPHOS 119  BILITOT 0.8  PROT 6.2*  ALBUMIN 3.0*   Recent Labs  Lab 04/17/21 1246  LIPASE 28   Urine analysis:    Component Value Date/Time   COLORURINE AMBER (A) 04/17/2021 1246   APPEARANCEUR TURBID (A) 04/17/2021 1246   LABSPEC 1.025 04/17/2021 1246   PHURINE 5.0 04/17/2021 1246   GLUCOSEU NEGATIVE 04/17/2021 1246   [place] 04/17/2021 1246   [place] 04/17/2021 1246   [place] 04/17/2021 1246   PROTEINUR 30 (A) 04/17/2021 1246   NITRITE NEGATIVE 04/17/2021 1246   LEUKOCYTESUR SMALL (A) 04/17/2021 1246   Dr. Tobie Poet [facility]  If 7PM-7AM, please contact overnight-coverage provider If 7AM-7PM, please contact day coverage provider [email or web address]  04/17/2021, 5:37 PM

## 2021-04-17 NOTE — Progress Notes (Signed)
I connected with Anna Marsh on 04/17/21 at  3:00 PM EDT by video enabled telemedicine visit and verified that I am speaking with the correct person using two identifiers.   I discussed the limitations, risks, security and privacy concerns of performing an evaluation and management service by telemedicine and the availability of in-person appointments. I also discussed with the patient that there may be a patient responsible charge related to this service. The patient expressed understanding and agreed to proceed.  Other persons participating in the visit and their role in the encounter:  Patients daughter  Patient's location:  home Provider's location:  home  Chief Complaint:  discuss ct abdomen results and further management  History of present illness: Patient is a 75 year old female with a past medical history significant for hypertension hyperlipidemia GERD, TIAs and strokes in the past.  She is on Plavix for her strokes.  She presented to [facility] health care with symptoms of left lower extremity thrombophlebitis her D-dimer was elevated at 5016.  She underwent CT a chest with contrast which showed multiple segmental pulmonary emboli in the right upper middle and lower lobe.  No evidence of right heart strain.  Several hepatic hypodensities including 3.2 cm segment 6 lesion and 3.5 cm segment 4B lesion concerning for metastatic disease.  No primary lung mass other than a 5 mm subpleural nodule.  No evidence of mediastinal adenopathy.  Patient did not undergo lower extremity ultrasound at that day as the machine was not working and underwent 5 days later which did not show any evidence of DVT in bilateral lower extremities.  The deep vein of the left calf was not adequately visualized.   Patient was seen by [facility] hematology for her PE and was recommended to stay on Eliquis 5 mg twice daily and also asked to stay on Plavix given her prior history of multiple strokes.  She is here for further evaluation of  her liver lesions.  She is up-to-date with her colonoscopy and mammograms.  She reports symptoms of abdominal bloating and lower abdominal pain.  CT chest abdomen pelvis with contrast showed multiple liver lesions the largest measuring 5.2 x 3.7 cm.  Pancreatic tail mass measuring 5 x 3.6 x 3.6 cm.  The lesion makes contact with the undersurface of the proximal stomach with some obscuration of the intervening fat plane.  The lesion is well separated from superior mesenteric artery and vein as well as the portal vein.  Splenic vein markedly narrowed related to lesion.  12 x 8.5 cm simple renal cyst.  No evidence of metastatic disease in the thorax.  Interval history patient's daughter reports some concerns with her memory and there are times when she is confused.  Overall patient is able to comprehend what I had to say today well.  She has ongoing fatigue and occasional lower abdominal pain and bloating   Review of Systems  Constitutional:  Positive for malaise/fatigue. Negative for chills, fever and weight loss.  HENT:  Negative for congestion, ear discharge and nosebleeds.   Eyes:  Negative for blurred vision.  Respiratory:  Negative for cough, hemoptysis, sputum production, shortness of breath and wheezing.   Cardiovascular:  Negative for chest pain, palpitations, orthopnea and claudication.  Gastrointestinal:  Positive for abdominal pain. Negative for blood in stool, constipation, diarrhea, heartburn, melena, nausea and vomiting.  Genitourinary:  Negative for dysuria, flank pain, frequency, hematuria and urgency.  Musculoskeletal:  Negative for back pain, joint pain and myalgias.  Skin:  Negative for rash.  Neurological:  Negative for dizziness, tingling, focal weakness, seizures, weakness and headaches.  Endo/Heme/Allergies:  Does not bruise/bleed easily.  Psychiatric/Behavioral:  Negative for depression and suicidal ideas. The patient does not have insomnia.    Allergies  Allergen  Reactions   Iodinated Diagnostic Agents Other (See Comments)    Causes migraines   Iodine     Contrast Dye causes migraines   Lisinopril Cough   Penicillins Rash    Has patient had a PCN reaction causing immediate rash, facial/tongue/throat swelling, SOB or lightheadedness with hypotension: Yes Has patient had a PCN reaction causing severe rash involving mucus membranes or skin necrosis: No Has patient had a PCN reaction that required hospitalization: No Has patient had a PCN reaction occurring within the last 10 years: Yes If all of the above answers are "NO", then may proceed with Cephalosporin use.    Past Medical History:  Diagnosis Date   AICD (automatic cardioverter/defibrillator) present    Anemia    Anxiety    AV block, complete (HCC)    Cervical radiculopathy    CHF (congestive heart failure) (HCC)    Chronic systolic heart failure (HCC)    GERD (gastroesophageal reflux disease)    History of cardiac arrest    Hypercholesteremia    Hypertension    Pre-diabetes    Presence of permanent cardiac pacemaker 02/03/2015   [facility]  with AICD   Right arm weakness     Past Surgical History:  Procedure Laterality Date   ABDOMINAL HYSTERECTOMY     CATARACT EXTRACTION W/PHACO Left 12/18/2016   Procedure: CATARACT EXTRACTION PHACO AND INTRAOCULAR LENS PLACEMENT ([place])  left;  Surgeon: Ronnell Freshwater, MD;  Location: [place];  Service: Ophthalmology;  Laterality: Left;   CATARACT EXTRACTION W/PHACO Right 01/01/2017   Procedure: CATARACT EXTRACTION PHACO AND INTRAOCULAR LENS PLACEMENT ([place])  Right;  Surgeon: Ronnell Freshwater, MD;  Location: [place];  Service: Ophthalmology;  Laterality: Right;   HERNIA REPAIR     umbilical x 2   NECK SURGERY     PACEMAKER IMPLANT  02/03/2015   [facility]   TONSILLECTOMY      Social History   Socioeconomic History   Marital status: Widowed    Spouse name: Not on file   Number of children: Not on file   Years of  education: Not on file   Highest education level: Not on file  Occupational History   Not on file  Tobacco Use   Smoking status: Former    Packs/day: 0.50    Years: 20.00    Pack years: 10.00    Types: Cigarettes    Quit date: 10/03/2003    Years since quitting: 17.5   Smokeless tobacco: Never  Vaping Use   Vaping Use: Never used  Substance and Sexual Activity   Alcohol use: No   Drug use: No   Sexual activity: Never  Other Topics Concern   Not on file  Social History Narrative   Not on file   Social Determinants of Health   Financial Resource Strain: Not on file  Food Insecurity: Not on file  Transportation Needs: Not on file  Physical Activity: Not on file  Stress: Not on file  Social Connections: Not on file  Intimate Partner Violence: Not on file    Family History  Problem Relation Age of Onset   Cancer Mother    Hyperlipidemia Father    Heart disease Father    Vascular Disease Father  Arthritis Sister    Colon cancer Sister    Migraines Brother    Alcohol abuse Sister    Dementia Sister    Bipolar disorder Sister    Heart attack Sister        around age 73   Arthritis Sister    Migraines Sister    Breast cancer Neg Hx      Current Outpatient Medications:    apixaban (ELIQUIS) 5 MG TABS tablet, Take by mouth., Disp: , Rfl:    atorvastatin (LIPITOR) 80 MG tablet, TAKE 1 TABLET BY MOUTH  DAILY, Disp: 90 tablet, Rfl: 3   butalbital-acetaminophen-caffeine (FIORICET) 50-325-40 MG tablet, Take 1 tablet by mouth every 4 (four) hours as needed for headache., Disp: 20 tablet, Rfl: 3   carvedilol (COREG) 6.25 MG tablet, Take by mouth., Disp: , Rfl:    clopidogrel (PLAVIX) 75 MG tablet, , Disp: , Rfl:    cyanocobalamin 1000 MCG tablet, Take 1,000 mcg by mouth daily. , Disp: , Rfl:    ENTRESTO 24-26 MG, , Disp: , Rfl:    ezetimibe (ZETIA) 10 MG tablet, ezetimibe 10 mg tablet, Disp: , Rfl:    Ferrous Sulfate Dried 200 (65 Fe) MG TABS, Take by mouth., Disp: ,  Rfl:    furosemide (LASIX) 20 MG tablet, TAKE 1 TABLET BY MOUTH  DAILY AS NEEDED, Disp: 90 tablet, Rfl: 1   gabapentin (NEURONTIN) 100 MG capsule, TAKE 2 CAPSULES BY MOUTH AT BEDTIME, Disp: 180 capsule, Rfl: 1   loratadine (CLARITIN) 10 MG tablet, Take 10 mg by mouth daily. , Disp: , Rfl:    nitroGLYCERIN (NITROSTAT) 0.4 MG SL tablet, Place 1 tablet (0.4 mg total) under the tongue every 5 (five) minutes as needed for chest pain., Disp: 30 tablet, Rfl: 3   pantoprazole (PROTONIX) 40 MG tablet, TAKE 1 TABLET BY MOUTH IN  THE MORNING, Disp: 90 tablet, Rfl: 3   potassium chloride (KLOR-CON) 10 MEQ tablet, TAKE 1 TABLET BY MOUTH  TWICE DAILY, Disp: 180 tablet, Rfl: 1   promethazine (PHENERGAN) 25 MG tablet, Take 1/2-1 tablet by mouth every 8 hours as needed for nausea., Disp: 30 tablet, Rfl: 1  CT CHEST ABDOMEN PELVIS W CONTRAST  Result Date: 04/13/2021 CLINICAL DATA:  75 year old female with history of multiple liver lesions noted on prior outside CT examination concerning for metastatic disease to the liver. Follow-up study. EXAM: CT CHEST, ABDOMEN, AND PELVIS WITH CONTRAST TECHNIQUE: Multidetector CT imaging of the chest, abdomen and pelvis was performed following the standard protocol during bolus administration of intravenous contrast. CONTRAST:  120mL OMNIPAQUE IOHEXOL 350 MG/ML SOLN COMPARISON:  CT of the abdomen and pelvis 08/14/2014. No prior chest CT. FINDINGS: CT CHEST FINDINGS Cardiovascular: Heart size is normal. There is no significant pericardial fluid, thickening or pericardial calcification. There is aortic atherosclerosis, as well as atherosclerosis of the great vessels of the mediastinum and the coronary arteries, including calcified atherosclerotic plaque in the left anterior descending and right coronary arteries. Calcifications of the mitral annulus. Left-sided biventricular pacemaker device in place with lead tips terminating in the right atrium, right ventricular apex and overlying the  lateral wall the left ventricle via the coronary sinus and coronary veins. Mediastinum/Nodes: No pathologically enlarged mediastinal or hilar lymph nodes. Esophagus is unremarkable in appearance. No axillary lymphadenopathy. Lungs/Pleura: Elevation of the right hemidiaphragm. Some associated passive subsegmental atelectasis in the basal segments of the right lower lobe. No acute consolidative airspace disease. No pleural effusions. No definite suspicious appearing pulmonary nodules or  masses are noted. Musculoskeletal: There are no aggressive appearing lytic or blastic lesions noted in the visualized portions of the skeleton. CT ABDOMEN PELVIS FINDINGS Hepatobiliary: Multiple hypovascular lesions are scattered throughout the hepatic parenchyma, likely reflective of widespread metastatic disease. The largest of these is in the left lobe of the liver straddling segments 2, 3, 4A and 4B (axial image 47 of series 2) measuring 5.2 x 3.7 cm. No intra or extrahepatic biliary ductal dilatation. Numerous tiny calcified gallstones lying dependently in the gallbladder. Gallbladder is not distended. No gallbladder wall thickening or pericholecystic fluid or surrounding inflammatory changes. Pancreas: In the tail of the pancreas there is a large hypovascular mass measuring 5.0 x 3.6 x 3.6 cm (coronal image 77 of series 6 and axial image 51 of series 2). Some curvilinear calcifications are noted along the posterior and superior wall of this lesion. This lesion makes contact with the undersurface of the proximal stomach with some obscuration of the intervening fat plane (best appreciated on coronal image 78 of series 6). This lesion is well separated from the superior mesenteric artery and vein as well as the portal vein. This lesion is in close proximity to the splenic artery and splenic vein. Splenic vein appears markedly narrowed related to the lesion, but appears grossly patent at this time. No pancreatic ductal dilatation. No  peripancreatic fluid collections or inflammatory changes. Spleen: Unremarkable. Adrenals/Urinary Tract: Multiple low-attenuation lesions in both kidneys, compatible with simple cysts, largest of which measures 12.0 x 8.5 cm in the upper pole of the left kidney. No hydroureteronephrosis. Urinary bladder is nearly decompressed, but otherwise unremarkable in appearance. Stomach/Bowel: The appearance of the stomach is normal. No pathologic dilatation of small bowel or colon. Normal appendix. Vascular/Lymphatic: Aortic atherosclerosis, without evidence of aneurysm or dissection in the abdominal or pelvic vasculature. No lymphadenopathy noted in the abdomen or pelvis. Reproductive: Status post hysterectomy.  Ovaries are atrophic. Other: No significant volume of ascites.  No pneumoperitoneum. Musculoskeletal: There are no aggressive appearing lytic or blastic lesions noted in the visualized portions of the skeleton. IMPRESSION: 1. Large hypovascular mass in the tail of the pancreas concerning for primary pancreatic malignancy. This is intimately associated with the undersurface of the proximal stomach without definite direct invasion at this time. This is also intimately associated with the splenic artery and vein, and there are numerous hypovascular hepatic lesions concerning for widespread metastatic disease to the liver. Further evaluation with endoscopic ultrasound and consideration for biopsy is strongly recommended. 2. No definitive findings to suggest metastatic disease to the thorax. 3. No evidence of metastatic disease in the pelvis. 4. Cholelithiasis without evidence of acute cholecystitis at this time. 5. Aortic atherosclerosis, in addition to 2 vessel coronary artery disease. Assessment for potential risk factor modification, dietary therapy or pharmacologic therapy may be warranted, if clinically indicated. 6. There are calcifications of the mitral annulus. Echocardiographic correlation for evaluation of  potential valvular dysfunction may be warranted if clinically indicated. 7. Additional incidental findings, as above. Electronically Signed   By: Vinnie Langton M.D.   On: 04/13/2021 11:29    No images are attached to the encounter.   CMP Latest Ref Rng & Units 04/07/2021  Glucose 65 - 99 mg/dL 128(H)  BUN 8 - 27 mg/dL 14  Creatinine 0.57 - 1.00 mg/dL 0.98  Sodium 134 - 144 mmol/L 142  Potassium 3.5 - 5.2 mmol/L 4.0  Chloride 96 - 106 mmol/L 106  CO2 20 - 29 mmol/L 24  Calcium 8.7 - 10.3  mg/dL 8.5(L)  Total Protein 6.0 - 8.5 g/dL 5.6(L)  Total Bilirubin 0.0 - 1.2 mg/dL 0.4  Alkaline Phos 44 - 121 IU/L 144(H)  AST 0 - 40 IU/L 19  ALT 0 - 32 IU/L 26   CBC Latest Ref Rng & Units 04/07/2021  WBC 3.4 - 10.8 x10E3/uL 7.6  Hemoglobin 11.1 - 15.9 g/dL 10.0(L)  Hematocrit 34.0 - 46.6 % 30.5(L)  Platelets 150 - 450 x10E3/uL 222     Observation/objective: Appears in no acute distress over video visit today.  Breathing is nonlabored  Assessment and plan: Patient is a 75 year old female with history of left lower extremity superficial thrombophlebitis incidentally found to have multiple liver lesions on CT thorax which also showed segmental pulmonary embolism.  She this is a visit to discuss CT abdomen results and further management  I have reviewed CT abdomen pelvis images independently and discussed findings with the patient and her daughter.  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  Discussed that this would constitute stage IV disease and treatment will be palliative and not curative.  Without treatment overall life expectancy is likely less than 6 months.  With treatment it could be up to a year to a year and a half but not longer.  Patient will need tissue diagnosis to confirm the diagnosis of pancreatic cancer and we  will arrange for an ultrasound-guided liver biopsy.  Patient is currently on Eliquis for her PE and Plavix for her history of strokes.  We will get in touch with cardiology regarding holding Plavix prior to procedure.  With regards to Eliquis, I would like her to stop Eliquis 2 days prior to procedure and start taking Lovenox which she will continue up to the night prior to procedure but hold Lovenox on the morning of procedure. She will restart eliquis night of procedure.   I will see her for video visit after biopsy results are back. Will check labs on the day of biopsy  Follow-up instructions: as above  I discussed the assessment and treatment plan with the patient. The patient was provided an opportunity to ask questions and all were answered. The patient agreed with the plan and demonstrated an understanding of the instructions.   The patient was advised to call back or seek an in-person evaluation if the symptoms worsen or if the condition fails to improve as anticipated.  Visit Diagnosis: 1. Goals of care, counseling/discussion   2. Liver metastases ([place])   3. Pancreatic mass     Dr. Randa Evens, MD, MPH [facility] Tel- [phone] 04/17/2021 10:22 AM

## 2021-04-17 NOTE — ED Triage Notes (Signed)
Pt comes into the ED via EMS from home with c/o generalized abd pain for the past couple weeks, was dx with pancreatic CA recently.. denies N/V  [id number] 144/68 67HR 97%RA 97.3 temp

## 2021-04-17 NOTE — ED Triage Notes (Signed)
See First RN note.

## 2021-04-17 NOTE — ED Notes (Signed)
Patient eating dinner with assistance from daughter

## 2021-04-18 ENCOUNTER — Ambulatory Visit: Payer: Medicare Other | Admitting: Radiation Oncology

## 2021-04-18 DIAGNOSIS — C7931 Secondary malignant neoplasm of brain: Secondary | ICD-10-CM | POA: Diagnosis not present

## 2021-04-18 DIAGNOSIS — G936 Cerebral edema: Secondary | ICD-10-CM | POA: Diagnosis present

## 2021-04-18 DIAGNOSIS — Z66 Do not resuscitate: Secondary | ICD-10-CM | POA: Diagnosis present

## 2021-04-18 DIAGNOSIS — Z8673 Personal history of transient ischemic attack (TIA), and cerebral infarction without residual deficits: Secondary | ICD-10-CM | POA: Diagnosis not present

## 2021-04-18 DIAGNOSIS — C259 Malignant neoplasm of pancreas, unspecified: Secondary | ICD-10-CM | POA: Diagnosis present

## 2021-04-18 DIAGNOSIS — Z95810 Presence of automatic (implantable) cardiac defibrillator: Secondary | ICD-10-CM

## 2021-04-18 DIAGNOSIS — K869 Disease of pancreas, unspecified: Secondary | ICD-10-CM

## 2021-04-18 DIAGNOSIS — Z515 Encounter for palliative care: Secondary | ICD-10-CM | POA: Diagnosis not present

## 2021-04-18 DIAGNOSIS — E7800 Pure hypercholesterolemia, unspecified: Secondary | ICD-10-CM | POA: Diagnosis present

## 2021-04-18 DIAGNOSIS — I803 Phlebitis and thrombophlebitis of lower extremities, unspecified: Secondary | ICD-10-CM | POA: Diagnosis present

## 2021-04-18 DIAGNOSIS — G131 Other systemic atrophy primarily affecting central nervous system in neoplastic disease: Secondary | ICD-10-CM | POA: Diagnosis present

## 2021-04-18 DIAGNOSIS — R55 Syncope and collapse: Secondary | ICD-10-CM | POA: Diagnosis present

## 2021-04-18 DIAGNOSIS — Z8674 Personal history of sudden cardiac arrest: Secondary | ICD-10-CM | POA: Diagnosis not present

## 2021-04-18 DIAGNOSIS — Z87891 Personal history of nicotine dependence: Secondary | ICD-10-CM | POA: Diagnosis not present

## 2021-04-18 DIAGNOSIS — Z20822 Contact with and (suspected) exposure to covid-19: Secondary | ICD-10-CM | POA: Diagnosis present

## 2021-04-18 DIAGNOSIS — I110 Hypertensive heart disease with heart failure: Secondary | ICD-10-CM | POA: Diagnosis present

## 2021-04-18 DIAGNOSIS — C787 Secondary malignant neoplasm of liver and intrahepatic bile duct: Secondary | ICD-10-CM | POA: Diagnosis not present

## 2021-04-18 DIAGNOSIS — D630 Anemia in neoplastic disease: Secondary | ICD-10-CM | POA: Diagnosis present

## 2021-04-18 DIAGNOSIS — R404 Transient alteration of awareness: Secondary | ICD-10-CM | POA: Diagnosis not present

## 2021-04-18 DIAGNOSIS — E119 Type 2 diabetes mellitus without complications: Secondary | ICD-10-CM | POA: Diagnosis present

## 2021-04-18 DIAGNOSIS — Z91041 Radiographic dye allergy status: Secondary | ICD-10-CM | POA: Diagnosis not present

## 2021-04-18 DIAGNOSIS — Z880 Allergy status to penicillin: Secondary | ICD-10-CM | POA: Diagnosis not present

## 2021-04-18 DIAGNOSIS — K219 Gastro-esophageal reflux disease without esophagitis: Secondary | ICD-10-CM | POA: Diagnosis not present

## 2021-04-18 DIAGNOSIS — Z86711 Personal history of pulmonary embolism: Secondary | ICD-10-CM | POA: Diagnosis not present

## 2021-04-18 DIAGNOSIS — Z888 Allergy status to other drugs, medicaments and biological substances status: Secondary | ICD-10-CM | POA: Diagnosis not present

## 2021-04-18 DIAGNOSIS — I442 Atrioventricular block, complete: Secondary | ICD-10-CM | POA: Diagnosis present

## 2021-04-18 DIAGNOSIS — I5022 Chronic systolic (congestive) heart failure: Secondary | ICD-10-CM | POA: Diagnosis not present

## 2021-04-18 LAB — CBC
HCT: 33 % — ABNORMAL LOW (ref 36.0–46.0)
Hemoglobin: 10.6 g/dL — ABNORMAL LOW (ref 12.0–15.0)
MCH: 26.4 pg (ref 26.0–34.0)
MCHC: 32.1 g/dL (ref 30.0–36.0)
MCV: 82.3 fL (ref 80.0–100.0)
Platelets: 139 10*3/uL — ABNORMAL LOW (ref 150–400)
RBC: 4.01 MIL/uL (ref 3.87–5.11)
RDW: 14.3 % (ref 11.5–15.5)
WBC: 7.1 10*3/uL (ref 4.0–10.5)
nRBC: 0 % (ref 0.0–0.2)

## 2021-04-18 LAB — BASIC METABOLIC PANEL
Anion gap: 7 (ref 5–15)
BUN: 20 mg/dL (ref 8–23)
CO2: 26 mmol/L (ref 22–32)
Calcium: 8.7 mg/dL — ABNORMAL LOW (ref 8.9–10.3)
Chloride: 105 mmol/L (ref 98–111)
Creatinine, Ser: 1.05 mg/dL — ABNORMAL HIGH (ref 0.44–1.00)
GFR, Estimated: 55 mL/min — ABNORMAL LOW (ref >60)
Glucose, Bld: 137 mg/dL — ABNORMAL HIGH (ref 70–99)
Potassium: 4 mmol/L (ref 3.5–5.1)
Sodium: 138 mmol/L (ref 135–145)

## 2021-04-18 LAB — GLUCOSE, CAPILLARY
Glucose-Capillary: 116 mg/dL — ABNORMAL HIGH (ref 70–99)
Glucose-Capillary: 152 mg/dL — ABNORMAL HIGH (ref 70–99)
Glucose-Capillary: 148 mg/dL — ABNORMAL HIGH (ref 70–99)
Glucose-Capillary: 136 mg/dL — ABNORMAL HIGH (ref 70–99)

## 2021-04-18 LAB — VITAMIN B12: Vitamin B-12: 2705 pg/mL — ABNORMAL HIGH (ref 180–914)

## 2021-04-18 MED ORDER — AMLODIPINE BESYLATE 5 MG PO TABS
5.0000 mg | ORAL_TABLET | Freq: Every day | ORAL | Status: DC
  Administered 2021-04-18 – 2021-04-19 (×2): 5 mg via ORAL
  Filled 2021-04-18 (×2): qty 1

## 2021-04-18 MED ORDER — HYDRALAZINE HCL 50 MG PO TABS
25.0000 mg | ORAL_TABLET | Freq: Three times a day (TID) | ORAL | Status: DC | PRN
  Administered 2021-04-18: 21:00:00 25 mg via ORAL
  Filled 2021-04-18: qty 1

## 2021-04-18 NOTE — Progress Notes (Signed)
OT Cancellation Note  Patient Details Name: Anna Marsh MRN: [id number] DOB: 03-15-1946   Cancelled Treatment:    Reason Eval/Treat Not Completed: Medical issues which prohibited therapy. OT order received and chart reviewed. Hold evaluation today per MD secondary to elevated BP with last report of 191/84. OT to re-attempt tomorrow if pt is more medically appropriate or further parameter's set by physician.  Darleen Crocker, MS, OTR/L , CBIS ascom [phone]  04/18/21, 10:56 AM

## 2021-04-18 NOTE — Progress Notes (Signed)
Patient admitted to unit around 2200, alert and oriented x 4, no complaints of pain, cardiac diet, tolerates thin liquids, skin warm, dry, and intact, generalized discoloration to bilateral lower extremities, hardened area to inner left calf, IV present to Left AC, 20G, vital signs stable, glucose WNL, no signs of acute distress noted

## 2021-04-18 NOTE — Consult Note (Addendum)
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 Karleen [place]  Date of visit: 04/18/2021    History of presenting illness-patient is a 75 year old female who was seen by me as an outpatient when she was incidentally found to have multiple liver lesions on CT chest which was done for recent diagnosis of pulmonary embolism.  Subsequent CT abdomen showed pancreatic tail mass and multiple liver lesions concerning for metastatic pancreatic cancer.  Plan was to get outpatient ultrasound-guided liver biopsy with bridging instructions for Eliquis as well as holding Plavix prior to biopsy.  Patient was brought to the hospital with symptoms of worsening confusion and had CT head without contrast.  She is allergic to IV contrast and has an AICD in place.  CT head without contrast showed Focal hypodensities/areas of edema in the right frontal lobe white matter and left cerebellum concerning for metastatic disease over infarcts given findings on recent imaging.  Oncology consulted for further management  ECOG PS- 2  Pain scale- 3   Review of systems- Review of Systems  Constitutional:  Positive for malaise/fatigue. Negative for chills, fever and weight loss.  HENT:  Negative for congestion, ear discharge and nosebleeds.   Eyes:  Negative for blurred vision.  Respiratory:  Negative for cough, hemoptysis, sputum production, shortness of breath and wheezing.   Cardiovascular:  Negative for chest pain, palpitations, orthopnea and claudication.  Gastrointestinal:  Positive for abdominal pain. Negative for blood in stool, constipation, diarrhea, heartburn, melena, nausea and vomiting.   Genitourinary:  Negative for dysuria, flank pain, frequency, hematuria and urgency.  Musculoskeletal:  Negative for back pain, joint pain and myalgias.  Skin:  Negative for rash.  Neurological:  Negative for dizziness, tingling, focal weakness, seizures, weakness and headaches.       Confusion  Endo/Heme/Allergies:  Does not bruise/bleed easily.  Psychiatric/Behavioral:  Negative for depression and suicidal ideas. The patient does not have insomnia.    Allergies  Allergen Reactions   Iodinated Diagnostic Agents Other (See Comments)    Causes migraines   Iodine     Contrast Dye causes migraines   Lisinopril Cough   Penicillins Rash    Has patient had a PCN reaction causing immediate rash, facial/tongue/throat swelling, SOB or lightheadedness with hypotension: Yes Has patient had a PCN reaction causing severe rash involving mucus membranes or skin necrosis: No Has patient had a PCN reaction that required hospitalization: No Has patient had a PCN reaction occurring within the last 10 years: Yes If all of the above answers are "NO", then may proceed with Cephalosporin use.    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   GERD (gastroesophageal reflux disease) 02/28/2015   Hypercholesterolemia 02/28/2015   Hypertension 02/28/2015   Cardiac defibrillator in place 02/28/2015   Block, bundle branch, left 02/28/2015   Adiposity 02/28/2015  Acid reflux 02/28/2015   Complete atrioventricular block (HCC) 29/52/8413   Chronic systolic CHF (congestive heart failure), NYHA class 3 ([place])  02/24/2015   H/O cardiac catheterization 02/24/2015   Combined fat and carbohydrate induced hyperlipemia 02/15/2015   Cardiomyopathy ([place]) 02/01/2015   Angina pectoris ([place]) 12/25/2013   Arthritis 12/25/2013   Hallux abducto valgus 12/25/2013     Past Medical History:  Diagnosis Date   AICD (automatic cardioverter/defibrillator) present    Anemia    Anxiety    AV block, complete (HCC)    Cervical radiculopathy    CHF (congestive heart failure) (HCC)    Chronic systolic heart failure (HCC)    GERD (gastroesophageal reflux disease)    History of cardiac arrest    Hypercholesteremia    Hypertension    Pancreatic cancer ([place])    stage 4   Pre-diabetes    Presence of permanent cardiac pacemaker 02/03/2015   [facility]  with AICD   Right arm weakness      Past Surgical History:  Procedure Laterality Date   ABDOMINAL HYSTERECTOMY     CATARACT EXTRACTION W/PHACO Left 12/18/2016   Procedure: CATARACT EXTRACTION PHACO AND INTRAOCULAR LENS PLACEMENT ([place])  left;  Surgeon: Ronnell Freshwater, MD;  Location: [facility];  Service: Ophthalmology;  Laterality: Left;   CATARACT EXTRACTION W/PHACO Right 01/01/2017   Procedure: CATARACT EXTRACTION PHACO AND INTRAOCULAR LENS PLACEMENT ([place])  Right;  Surgeon: Ronnell Freshwater, MD;  Location: [place];  Service: Ophthalmology;  Laterality: Right;   HERNIA REPAIR     umbilical x 2   NECK SURGERY     PACEMAKER IMPLANT  02/03/2015   [facility]   TONSILLECTOMY      Social History   Socioeconomic History   Marital status: Widowed    Spouse name: Not on file   Number of children: Not on file   Years of education: Not on file   Highest education level: Not on file  Occupational History   Not on file  Tobacco Use   Smoking status: Former    Packs/day: 0.50    Years: 20.00    Pack years: 10.00    Types: Cigarettes    Quit date: 10/03/2003    Years since quitting: 17.5   Smokeless tobacco: Never  Vaping Use   Vaping  Use: Never used  Substance and Sexual Activity   Alcohol use: No   Drug use: No   Sexual activity: Never  Other Topics Concern   Not on file  Social History Narrative   Not on file   Social Determinants of Health   Financial Resource Strain: Not on file  Food Insecurity: Not on file  Transportation Needs: Not on file  Physical Activity: Not on file  Stress: Not on file  Social Connections: Not on file  Intimate Partner Violence: Not on file     Family History  Problem Relation Age of Onset   Cancer Mother    Hyperlipidemia Father    Heart disease Father    Vascular Disease Father    Arthritis Sister    Colon cancer Sister    Migraines Brother    Alcohol abuse Sister    Dementia Sister    Bipolar disorder Sister    Heart attack Sister        around age 61   Arthritis Sister    Migraines Sister    Breast cancer Neg Hx      Current Facility-Administered Medications:    acetaminophen (  TYLENOL) tablet 650 mg, 650 mg, Oral, Q6H PRN **OR** acetaminophen (TYLENOL) suppository 650 mg, 650 mg, Rectal, Q6H PRN, Cox, Amy N, DO   amLODipine (NORVASC) tablet 5 mg, 5 mg, Oral, Daily, Hosie Poisson, MD, 5 mg at 04/18/21 1884   apixaban (ELIQUIS) tablet 5 mg, 5 mg, Oral, BID, Cox, Amy N, DO, 5 mg at 04/18/21 1660   atorvastatin (LIPITOR) tablet 80 mg, 80 mg, Oral, Daily, Cox, Amy N, DO, 80 mg at 04/18/21 6301   butalbital-acetaminophen-caffeine (FIORICET) 50-325-40 MG per tablet 1 tablet, 1 tablet, Oral, Q4H PRN, Cox, Amy N, DO, 1 tablet at 04/18/21 1145   carvedilol (COREG) tablet 6.25 mg, 6.25 mg, Oral, BID WC, Cox, Amy N, DO   clopidogrel (PLAVIX) tablet 75 mg, 75 mg, Oral, Daily, Cox, Amy N, DO, 75 mg at 04/18/21 6010   dexamethasone (DECADRON) injection 4 mg, 4 mg, Intravenous, Q6H, Cox, Amy N, DO, 4 mg at 04/18/21 1145   ezetimibe (ZETIA) tablet 10 mg, 10 mg, Oral, Daily, Cox, Amy N, DO, 10 mg at 04/18/21 0824   fentaNYL (SUBLIMAZE) injection 12.5 mcg, 12.5 mcg, Intravenous,  Q2H PRN, Cox, Amy N, DO   gabapentin (NEURONTIN) capsule 100 mg, 100 mg, Oral, BID, Cox, Amy N, DO, 100 mg at 04/18/21 9323   hydrALAZINE (APRESOLINE) tablet 25 mg, 25 mg, Oral, Q8H PRN, Hosie Poisson, MD   insulin aspart (novoLOG) injection 0-15 Units, 0-15 Units, Subcutaneous, TID WC, Cox, Amy N, DO, 5 Units at 04/18/21 1200   insulin aspart (novoLOG) injection 0-5 Units, 0-5 Units, Subcutaneous, QHS, Cox, Amy N, DO   loratadine (CLARITIN) tablet 10 mg, 10 mg, Oral, Daily, Cox, Amy N, DO, 10 mg at 04/18/21 5573   LORazepam (ATIVAN) injection 2 mg, 2 mg, Intravenous, Q4H PRN, Cox, Amy N, DO   morphine 2 MG/ML injection 1 mg, 1 mg, Intravenous, Q4H PRN, Cox, Amy N, DO   nitroGLYCERIN (NITROSTAT) SL tablet 0.4 mg, 0.4 mg, Sublingual, Q5 min PRN, Cox, Amy N, DO   ondansetron (ZOFRAN) tablet 4 mg, 4 mg, Oral, Q6H PRN **OR** ondansetron (ZOFRAN) injection 4 mg, 4 mg, Intravenous, Q6H PRN, Cox, Amy N, DO   pantoprazole (PROTONIX) EC tablet 40 mg, 40 mg, Oral, q morning, Cox, Amy N, DO, 40 mg at 04/18/21 2202   vitamin B-12 (CYANOCOBALAMIN) tablet 1,000 mcg, 1,000 mcg, Oral, Daily, Cox, Amy N, DO, 1,000 mcg at 04/18/21 5427   Physical exam:  Vitals:   04/18/21 0100 04/18/21 0547 04/18/21 0805 04/18/21 1137  BP: (!) 186/70 (!) 198/63 (!) 191/84 (!) 157/63  Pulse: 75 70 68 75  Resp: 18 18 18 18   Temp: 98.2 F (36.8 C) 97.7 F (36.5 C) 97.7 F (36.5 C) 97.6 F (36.4 C)  TempSrc: Axillary Oral Oral Oral  SpO2: 97% 97% 97% 98%  Weight:      Height:       Physical Exam Constitutional:      General: She is not in acute distress. Cardiovascular:     Rate and Rhythm: Normal rate and regular rhythm.     Heart sounds: Normal heart sounds.  Pulmonary:     Effort: Pulmonary effort is normal.     Breath sounds: Normal breath sounds.  Abdominal:     General: Bowel sounds are normal.     Palpations: Abdomen is soft.  Skin:    General: Skin is warm and dry.  Neurological:     Mental Status: She  is alert and oriented to person, place,  and time.       CMP Latest Ref Rng & Units 04/18/2021  Glucose 70 - 99 mg/dL 137(H)  BUN 8 - 23 mg/dL 20  Creatinine 0.44 - 1.00 mg/dL 1.05(H)  Sodium 135 - 145 mmol/L 138  Potassium 3.5 - 5.1 mmol/L 4.0  Chloride 98 - 111 mmol/L 105  CO2 22 - 32 mmol/L 26  Calcium 8.9 - 10.3 mg/dL 8.7(L)  Total Protein 6.5 - 8.1 g/dL -  Total Bilirubin 0.3 - 1.2 mg/dL -  Alkaline Phos 38 - 126 U/L -  AST 15 - 41 U/L -  ALT 0 - 44 U/L -   CBC Latest Ref Rng & Units 04/18/2021  WBC 4.0 - 10.5 K/uL 7.1  Hemoglobin 12.0 - 15.0 g/dL 10.6(L)  Hematocrit 36.0 - 46.0 % 33.0(L)  Platelets 150 - 400 K/uL 139(L)    @IMAGES @  CT Head Wo Contrast  Result Date: 04/17/2021 CLINICAL DATA:  Mental status change EXAM: CT HEAD WITHOUT CONTRAST TECHNIQUE: Contiguous axial images were obtained from the base of the skull through the vertex without intravenous contrast. COMPARISON:  CT brain 07/04/2018 FINDINGS: Brain: No hemorrhage is visualized. Hypodensity/edema within the left cerebellum and right posterior frontal subcortical white matter. No midline shift. Stable ventricle size. Vascular: No hyperdense vessels. Scattered carotid vascular calcification Skull: Normal. Negative for fracture or focal lesion. Sinuses/Orbits: No acute finding. Other: None IMPRESSION: 1. Focal hypodensities/areas of edema within the right frontal lobe white matter and the left cerebellum, favored to represent neoplasm/metastatic disease over infarcts, given findings on recent body imaging. Negative for acute intracranial hemorrhage. Electronically Signed   By: Donavan Foil M.D.   On: 04/17/2021 16:33   CT CHEST ABDOMEN PELVIS W CONTRAST  Result Date: 04/13/2021 CLINICAL DATA:  75 year old female with history of multiple liver lesions noted on prior outside CT examination concerning for metastatic disease to the liver. Follow-up study. EXAM: CT CHEST, ABDOMEN, AND PELVIS WITH CONTRAST TECHNIQUE:  Multidetector CT imaging of the chest, abdomen and pelvis was performed following the standard protocol during bolus administration of intravenous contrast. CONTRAST:  1106mL OMNIPAQUE IOHEXOL 350 MG/ML SOLN COMPARISON:  CT of the abdomen and pelvis 08/14/2014. No prior chest CT. FINDINGS: CT CHEST FINDINGS Cardiovascular: Heart size is normal. There is no significant pericardial fluid, thickening or pericardial calcification. There is aortic atherosclerosis, as well as atherosclerosis of the great vessels of the mediastinum and the coronary arteries, including calcified atherosclerotic plaque in the left anterior descending and right coronary arteries. Calcifications of the mitral annulus. Left-sided biventricular pacemaker device in place with lead tips terminating in the right atrium, right ventricular apex and overlying the lateral wall the left ventricle via the coronary sinus and coronary veins. Mediastinum/Nodes: No pathologically enlarged mediastinal or hilar lymph nodes. Esophagus is unremarkable in appearance. No axillary lymphadenopathy. Lungs/Pleura: Elevation of the right hemidiaphragm. Some associated passive subsegmental atelectasis in the basal segments of the right lower lobe. No acute consolidative airspace disease. No pleural effusions. No definite suspicious appearing pulmonary nodules or masses are noted. Musculoskeletal: There are no aggressive appearing lytic or blastic lesions noted in the visualized portions of the skeleton. CT ABDOMEN PELVIS FINDINGS Hepatobiliary: Multiple hypovascular lesions are scattered throughout the hepatic parenchyma, likely reflective of widespread metastatic disease. The largest of these is in the left lobe of the liver straddling segments 2, 3, 4A and 4B (axial image 47 of series 2) measuring 5.2 x 3.7 cm. No intra or extrahepatic biliary ductal dilatation. Numerous tiny calcified gallstones  lying dependently in the gallbladder. Gallbladder is not distended. No  gallbladder wall thickening or pericholecystic fluid or surrounding inflammatory changes. Pancreas: In the tail of the pancreas there is a large hypovascular mass measuring 5.0 x 3.6 x 3.6 cm (coronal image 77 of series 6 and axial image 51 of series 2). Some curvilinear calcifications are noted along the posterior and superior wall of this lesion. This lesion makes contact with the undersurface of the proximal stomach with some obscuration of the intervening fat plane (best appreciated on coronal image 78 of series 6). This lesion is well separated from the superior mesenteric artery and vein as well as the portal vein. This lesion is in close proximity to the splenic artery and splenic vein. Splenic vein appears markedly narrowed related to the lesion, but appears grossly patent at this time. No pancreatic ductal dilatation. No peripancreatic fluid collections or inflammatory changes. Spleen: Unremarkable. Adrenals/Urinary Tract: Multiple low-attenuation lesions in both kidneys, compatible with simple cysts, largest of which measures 12.0 x 8.5 cm in the upper pole of the left kidney. No hydroureteronephrosis. Urinary bladder is nearly decompressed, but otherwise unremarkable in appearance. Stomach/Bowel: The appearance of the stomach is normal. No pathologic dilatation of small bowel or colon. Normal appendix. Vascular/Lymphatic: Aortic atherosclerosis, without evidence of aneurysm or dissection in the abdominal or pelvic vasculature. No lymphadenopathy noted in the abdomen or pelvis. Reproductive: Status post hysterectomy.  Ovaries are atrophic. Other: No significant volume of ascites.  No pneumoperitoneum. Musculoskeletal: There are no aggressive appearing lytic or blastic lesions noted in the visualized portions of the skeleton. IMPRESSION: 1. Large hypovascular mass in the tail of the pancreas concerning for primary pancreatic malignancy. This is intimately associated with the undersurface of the proximal  stomach without definite direct invasion at this time. This is also intimately associated with the splenic artery and vein, and there are numerous hypovascular hepatic lesions concerning for widespread metastatic disease to the liver. Further evaluation with endoscopic ultrasound and consideration for biopsy is strongly recommended. 2. No definitive findings to suggest metastatic disease to the thorax. 3. No evidence of metastatic disease in the pelvis. 4. Cholelithiasis without evidence of acute cholecystitis at this time. 5. Aortic atherosclerosis, in addition to 2 vessel coronary artery disease. Assessment for potential risk factor modification, dietary therapy or pharmacologic therapy may be warranted, if clinically indicated. 6. There are calcifications of the mitral annulus. Echocardiographic correlation for evaluation of potential valvular dysfunction may be warranted if clinically indicated. 7. Additional incidental findings, as above. Electronically Signed   By: Vinnie Langton M.D.   On: 04/13/2021 11:29   DG Chest Port 1 View  Result Date: 04/17/2021 CLINICAL DATA:  Weakness.  Generalized abdominal pain. EXAM: PORTABLE CHEST 1 VIEW COMPARISON:  Radiograph 12/05/2016.  CT 04/13/2021 FINDINGS: Left-sided pacemaker in place. Normal heart size with stable mediastinal contours aortic atherosclerosis. Elevated right hemidiaphragm with adjacent compressive atelectasis. No acute airspace disease. No pulmonary edema or pleural effusion. No acute osseous abnormalities are seen. Presumed external artifact projects over the left upper thorax. IMPRESSION: No acute chest finding. Elevated right hemidiaphragm with adjacent compressive atelectasis. Electronically Signed   By: Keith Rake M.D.   On: 04/17/2021 16:15    Assessment and plan- Patient is a 75 y.o. female with newly diagnosed pancreatic tail mass, multiple liver metastases now found to have findings concerning for brain metastases  Possible brain  metastases: I have reviewed her CT head images independently and also discussed her case with Dr. Donella Stade from  radiation oncology who has seen the patient today and we will plan to start whole brain radiation soon.  She is currently on 4 mg IV every 6 of Decadron and when medically stable she can be discharged on Decadron 8 mg twice daily without taper and she can continue to get radiation treatment as an outpatient.   Pancreatic mass/liver metastases: We are working on getting ultrasound-guided liver biopsy as an outpatient.  I have discussed the CT findings with the patient and both patient and daughter are willing to proceed with whole brain radiation treatment at this time.  Based on her performance status and wishes we will consider offering systemic chemotherapy down the line but that would be after whole brain radiation is completed.  At the time of my visit today patient seems to be alert and oriented and does not appear confused  Neoplasm related abdominal pain: Currently she is on as needed morphine and she can be transitioned to oxycodone 5 mg every 6 as needed upon discharge   Thank you for this kind referral and the opportunity to participate in the care of this patient   Visit Diagnosis 1. Brain metastases [facility])     Dr. Randa Evens, MD, MPH [facility] at [facility] [phone] 04/18/2021

## 2021-04-18 NOTE — Progress Notes (Signed)
   04/18/21 1440  Clinical Encounter Type  Visited With Patient and family together  Visit Type Initial;Social support;Spiritual support  Referral From Nurse  Consult/Referral To Chaplain  Spiritual Encounters  Spiritual Needs Emotional;Prayer;Grief support   Andee Poles prayed with PT and completed an AD education. Chaplain Mariella Saa will complete AD tomorrow morning. PT's daughter was at bedside (she is also an employee in the medical mall). Both were able to use storytelling as a way of coping. Chaplain encouraged PT and PT's daughter and let them know that a Chaplain is always available.

## 2021-04-18 NOTE — Progress Notes (Signed)
PT Cancellation Note  Patient Details Name: Anna Marsh MRN: [id number] DOB: June 18, 1946   Cancelled Treatment:    Reason Eval/Treat Not Completed: Other (comment) PT orders received, chart reviewed. Pt noted to have ongoing elevated systolic BP with last reading of 191/84 mmHg. MD consulted & states to hold PT evaluation today. Will f/u as able & as pt is medically appropriate to participate.   Lavone Nian, PT, DPT 04/18/21, 11:23 AM    [name] 04/18/2021, 11:22 AM

## 2021-04-18 NOTE — Progress Notes (Addendum)
Inner left calf, hardened area, generalized discoloration

## 2021-04-18 NOTE — Consult Note (Signed)
NEW PATIENT EVALUATION  Name: Anna Marsh  MRN: [id number]  Date:   04/17/2021     DOB: [birth year]   This 75 y.o. 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  REFERRING PHYSICIAN: No ref. provider found  CHIEF COMPLAINT:  Chief Complaint  Patient presents with   Abdominal Pain    DIAGNOSIS: The encounter diagnosis was Brain metastases ([place]).   PREVIOUS INVESTIGATIONS:  CT scan of the head chest abdomen pelvis reviewed Clinical notes reviewed Labs reviewed  HPI: Patient is a 75 year old female who presented to [facility] with evidence of lower extremity thrombophlebitis.  Upon work-up CT scan of her abdomen showed multiple liver lesions consistent with metastatic disease.  Further evidence on imaging CT scan showed a large hypervascular mass in the tail the pancreas concerning for primary pancreatic carcinoma.  Tumor involves the undersurface of the proximal stomach without definitive invasion.  Lesion was also intimately involved with the splenic artery and vein.  CT scan of the brain showed focal hypodensities in the right frontal lobe left cerebellum consistent with metastatic disease.  She was having some confusion and headaches although that is resolved with current steroid therapy.  She is seen today for consideration of palliative radiation therapy to her brain.  She is having biopsy of her liver planned.  She specifically denies change in visual fields or any focal neurologic deficits.  PLANNED TREATMENT REGIMEN: Whole brain radiation  PAST MEDICAL HISTORY:  has a past medical history of AICD (automatic cardioverter/defibrillator) present, Anemia, Anxiety, AV block, complete (HCC), Cervical radiculopathy, CHF (congestive heart failure) (HCC), Chronic systolic heart failure ([place]), GERD (gastroesophageal reflux disease), History of cardiac arrest, Hypercholesteremia, Hypertension, Pancreatic  cancer ([place]), Pre-diabetes, Presence of permanent cardiac pacemaker (02/03/2015), and Right arm weakness.    PAST SURGICAL HISTORY:  Past Surgical History:  Procedure Laterality Date   ABDOMINAL HYSTERECTOMY     CATARACT EXTRACTION W/PHACO Left 12/18/2016   Procedure: CATARACT EXTRACTION PHACO AND INTRAOCULAR LENS PLACEMENT ([place])  left;  Surgeon: Ronnell Freshwater, MD;  Location: [place];  Service: Ophthalmology;  Laterality: Left;   CATARACT EXTRACTION W/PHACO Right 01/01/2017   Procedure: CATARACT EXTRACTION PHACO AND INTRAOCULAR LENS PLACEMENT ([place])  Right;  Surgeon: Ronnell Freshwater, MD;  Location: [place];  Service: Ophthalmology;  Laterality: Right;   HERNIA REPAIR     umbilical x 2   NECK SURGERY     PACEMAKER IMPLANT  02/03/2015   [facility]   TONSILLECTOMY      FAMILY HISTORY: family history includes Alcohol abuse in her sister; Arthritis in her sister and sister; Bipolar disorder in her sister; Cancer in her mother; Colon cancer in her sister; Dementia in her sister; Heart attack in her sister; Heart disease in her father; Hyperlipidemia in her father; Migraines in her brother and sister; Vascular Disease in her father.  SOCIAL HISTORY:  reports that she quit smoking about 17 years ago. Her smoking use included cigarettes. She has a 10.00 pack-year smoking history. She has never used smokeless tobacco. She reports that she does not drink alcohol and does not use drugs.  ALLERGIES: Iodinated diagnostic agents, Iodine, Lisinopril, and Penicillins  MEDICATIONS:  Current Facility-Administered Medications  Medication Dose Route Frequency Provider Last Rate Last Admin   acetaminophen (TYLENOL) tablet 650 mg  650 mg Oral Q6H PRN Cox, Amy N, DO       Or   acetaminophen (TYLENOL) suppository 650  mg  650 mg Rectal Q6H PRN Cox, Amy N, DO       amLODipine (NORVASC) tablet 5 mg  5 mg Oral Daily Hosie Poisson, MD   5 mg at 04/18/21 7425   apixaban (ELIQUIS)  tablet 5 mg  5 mg Oral BID Cox, Amy N, DO   5 mg at 04/18/21 9563   atorvastatin (LIPITOR) tablet 80 mg  80 mg Oral Daily Cox, Amy N, DO   80 mg at 04/18/21 8756   butalbital-acetaminophen-caffeine (FIORICET) 50-325-40 MG per tablet 1 tablet  1 tablet Oral Q4H PRN Cox, Amy N, DO       carvedilol (COREG) tablet 6.25 mg  6.25 mg Oral BID WC Cox, Amy N, DO       clopidogrel (PLAVIX) tablet 75 mg  75 mg Oral Daily Cox, Amy N, DO   75 mg at 04/18/21 4332   dexamethasone (DECADRON) injection 4 mg  4 mg Intravenous Q6H Cox, Amy N, DO   4 mg at 04/18/21 9518   ezetimibe (ZETIA) tablet 10 mg  10 mg Oral Daily Cox, Amy N, DO   10 mg at 04/18/21 8416   fentaNYL (SUBLIMAZE) injection 12.5 mcg  12.5 mcg Intravenous Q2H PRN Cox, Amy N, DO       gabapentin (NEURONTIN) capsule 100 mg  100 mg Oral BID Cox, Amy N, DO   100 mg at 04/18/21 6063   hydrALAZINE (APRESOLINE) tablet 25 mg  25 mg Oral Q8H PRN Hosie Poisson, MD       insulin aspart (novoLOG) injection 0-15 Units  0-15 Units Subcutaneous TID WC Cox, Amy N, DO       insulin aspart (novoLOG) injection 0-5 Units  0-5 Units Subcutaneous QHS Cox, Amy N, DO       loratadine (CLARITIN) tablet 10 mg  10 mg Oral Daily Cox, Amy N, DO   10 mg at 04/18/21 0160   LORazepam (ATIVAN) injection 2 mg  2 mg Intravenous Q4H PRN Cox, Amy N, DO       morphine 2 MG/ML injection 1 mg  1 mg Intravenous Q4H PRN Cox, Amy N, DO       nitroGLYCERIN (NITROSTAT) SL tablet 0.4 mg  0.4 mg Sublingual Q5 min PRN Cox, Amy N, DO       ondansetron (ZOFRAN) tablet 4 mg  4 mg Oral Q6H PRN Cox, Amy N, DO       Or   ondansetron (ZOFRAN) injection 4 mg  4 mg Intravenous Q6H PRN Cox, Amy N, DO       pantoprazole (PROTONIX) EC tablet 40 mg  40 mg Oral q morning Cox, Amy N, DO   40 mg at 04/18/21 1093   vitamin B-12 (CYANOCOBALAMIN) tablet 1,000 mcg  1,000 mcg Oral Daily Cox, Amy N, DO   1,000 mcg at 04/18/21 2355    ECOG PERFORMANCE STATUS:  1 - Symptomatic but completely ambulatory  REVIEW OF  SYSTEMS: Patient denies any weight loss, fatigue, weakness, fever, chills or night sweats. Patient denies any loss of vision, blurred vision. Patient denies any ringing  of the ears or hearing loss. No irregular heartbeat. Patient denies heart murmur or history of fainting. Patient denies any chest pain or pain radiating to her upper extremities. Patient denies any shortness of breath, difficulty breathing at night, cough or hemoptysis. Patient denies any swelling in the lower legs. Patient denies any nausea vomiting, vomiting of blood, or coffee ground material in the vomitus. Patient denies any stomach  pain. Patient states has had normal bowel movements no significant constipation or diarrhea. Patient denies any dysuria, hematuria or significant nocturia. Patient denies any problems walking, swelling in the joints or loss of balance. Patient denies any skin changes, loss of hair or loss of weight. Patient denies any excessive worrying or anxiety or significant depression. Patient denies any problems with insomnia. Patient denies excessive thirst, polyuria, polydipsia. Patient denies any swollen glands, patient denies easy bruising or easy bleeding. Patient denies any recent infections, allergies or URI. Patient "s visual fields have not changed significantly in recent time.   PHYSICAL EXAM: BP (!) 191/84 (BP Location: Right Wrist)   Pulse 68   Temp 97.7 F (36.5 C) (Oral)   Resp 18   Ht 5\' 3"  (1.6 m)   Wt 273 lb 9.5 oz (124.1 kg)   SpO2 97%   BMI 48.46 kg/m  Crude visual fields within normal range no focal neurologic deficits are detected.  Motor or sensory and DTR levels are equal and symmetric upper lower extremities.  Proprioception is intact.  Well-developed well-nourished patient in NAD. HEENT reveals PERLA, EOMI, discs not visualized.  Oral cavity is clear. No oral mucosal lesions are identified. Neck is clear without evidence of cervical or supraclavicular adenopathy. Lungs are clear to A&P.  Cardiac examination is essentially unremarkable with regular rate and rhythm without murmur rub or thrill. Abdomen is benign with no organomegaly or masses noted. Motor sensory and DTR levels are equal and symmetric in the upper and lower extremities. Cranial nerves II through XII are grossly intact. Proprioception is intact. No peripheral adenopathy or edema is identified. No motor or sensory levels are noted. Crude visual fields are within normal range.  LABORATORY DATA: Pathology reports pending    RADIOLOGY RESULTS: CT scans abdomen chest pelvis and brain reviewed compatible with above-stated findings   IMPRESSION: Stage IV pancreatic cancer with liver and brain metastasis in 75 year old female  PLAN: This time up to ahead and start whole brain radiation therapy.  I would plan on delivering 30 Gray in 12 fractions.  Risks and benefits of treatment eluding hair loss skin reaction fatigue alteration blood counts all were reviewed in detail with the patient and her daughter.  They both seem to comprehend my treatment plan well.  I have personally set up and ordered CT simulation for later this week.  In the interim she is being scheduled for I believe CT-guided biopsy of her liver to establish pathologic diagnosis.  Patient and daughter both seem to comprehend my treatment plan well.  I would like to take this opportunity to thank you for allowing me to participate in the care of your patient.Noreene Filbert, MD

## 2021-04-18 NOTE — Progress Notes (Addendum)
PROGRESS NOTE    Anna Marsh  OMV:[id number] DOB: [birth year] DOA: 04/17/2021 PCP: Jerrol Banana., MD   Chief Complaint  Patient presents with   Abdominal Pain    Brief Narrative:  Anna Marsh is a 75 y.o. female with medical history significant for GERD, hyperlipidemia, hypertension, presents to the emergency department for chief concerns of confusion. 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 Oncology and palliative care consulted for goals of care.   Assessment & Plan:   Principal Problem:   Altered mental status Active Problems:   GERD (gastroesophageal reflux disease)   Hypercholesterolemia   Hypertension   Cardiac defibrillator in place   Adiposity   Chronic systolic CHF (congestive heart failure), NYHA class 3 (HCC)   Acid reflux   TIA (transient ischemic attack)   Brain metastases (HCC)   Pancreatic mass    Acute encephalopathy secondary to brain mets:  Improving with IV Decadron 4 mg EVERY 6 HOURS, pt appears to be back to her baseline as per her daughter.  Seizure precautions with IV ativan.  Oncology and palliative care on board.  Pain control.  Plan for radiation oncology consult for brain irradiation.    Recently diagnosed metastatic pancreatic cancer:  - oncology on board.    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    Hypertension:  Not well controlled.  On coreg, will add amlodipine and hydralazine.    Chronic systolic CHF;  S/p AICD;  Pt currently denies any chest pain or sob.     TIA:  On plavix.    Hyperlipidemia:  On statin. Continue the same.    GERD;  Stable.   Abdominal pain:  Probably from liver mets.  Resolved at this time.    Anemia of chronic disease:  Hemoglobin stable around 10.     DVT prophylaxis: Eliquis Code Status: (DNR) Family Communication: none at bedside.  Disposition:   Status is: Observation  The patient will require care spanning > 2 midnights and should be  moved to inpatient because: Unsafe d/c plan and IV treatments appropriate due to intensity of illness or inability to take PO  Dispo: The patient is from: Home              Anticipated d/c is to:  pending.               Patient currently is not medically stable to d/c.   Difficult to place patient No       Consultants:  Oncology Palliative care.   Procedures: CT head CT abdomen and pelvis.   Antimicrobials:  Antibiotics Given (last 72 hours)     None         Subjective: Calm and comfortable.   Objective: Vitals:   04/17/21 2130 04/17/21 2205 04/18/21 0100 04/18/21 0547  BP: (!) 169/60 (!) 195/70 (!) 186/70 (!) 198/63  Pulse: 72 72 75 70  Resp:  18 18 18   Temp:  98.4 F (36.9 C) 98.2 F (36.8 C) 97.7 F (36.5 C)  TempSrc:  Oral Axillary Oral  SpO2: 95% 96% 97% 97%  Weight:  124.1 kg    Height:  5\' 3"  (1.6 m)      Intake/Output Summary (Last 24 hours) at 04/18/2021 0746 Last data filed at 04/17/2021 2017 Gross per 24 hour  Intake 500 ml  Output --  Net 500 ml   Filed Weights   04/17/21 1240 04/17/21 2205  Weight: 125.2 kg 124.1 kg  Examination:  General exam: Appears calm and comfortable  Respiratory system: Clear to auscultation. Respiratory effort normal. Cardiovascular system: S1 & S2 heard, RRR. No JVD, No pedal edema. Gastrointestinal system: Abdomen is nondistended, soft and nontender. Normal bowel sounds heard. Central nervous system: Alert and oriented. No focal neurological deficits. Extremities: pedal edema.  Skin: No rashes,  Psychiatry: Mood & affect appropriate.     Data Reviewed: I have personally reviewed following labs and imaging studies  CBC: Recent Labs  Lab 04/17/21 1246 04/18/21 0629  WBC 10.0 7.1  HGB 10.1* 10.6*  HCT 31.6* 33.0*  MCV 84.0 82.3  PLT 150 139*    Basic Metabolic Panel: Recent Labs  Lab 04/17/21 1246 04/18/21 0629  NA 137 138  K 3.6 4.0  CL 102 105  CO2 28 26  GLUCOSE 123* 137*  BUN 17 20   CREATININE 0.99 1.05*  CALCIUM 8.6* 8.7*    GFR: Estimated Creatinine Clearance: 59.3 mL/min (A) (by C-G formula based on SCr of 1.05 mg/dL (H)).  Liver Function Tests: Recent Labs  Lab 04/17/21 1246  AST 35  ALT 36  ALKPHOS 119  BILITOT 0.8  PROT 6.2*  ALBUMIN 3.0*    CBG: Recent Labs  Lab 04/17/21 1815 04/17/21 2300  GLUCAP 106* 140*     Recent Results (from the past 240 hour(s))  Resp Panel by RT-PCR (Flu A&B, Covid) Nasopharyngeal Swab     Status: None   Collection Time: 04/17/21  4:00 PM   Specimen: Nasopharyngeal Swab; Nasopharyngeal(NP) swabs in vial transport medium  Result Value Ref Range Status   SARS Coronavirus 2 by RT PCR NEGATIVE NEGATIVE Final    Comment: (NOTE) SARS-CoV-2 target nucleic acids are NOT DETECTED.  The SARS-CoV-2 RNA is generally detectable in upper respiratory specimens during the acute phase of infection. The lowest concentration of SARS-CoV-2 viral copies this assay can detect is 138 copies/mL. A negative result does not preclude SARS-Cov-2 infection and should not be used as the sole basis for treatment or other patient management decisions. A negative result may occur with  improper specimen collection/handling, submission of specimen other than nasopharyngeal swab, presence of viral mutation(s) within the areas targeted by this assay, and inadequate number of viral copies(<138 copies/mL). A negative result must be combined with clinical observations, patient history, and epidemiological information. The expected result is Negative.  Fact Sheet for Patients:  EntrepreneurPulse.com.au  Fact Sheet for Healthcare Providers:  IncredibleEmployment.be  This test is no t yet approved or cleared by the [place] FDA and  has been authorized for detection and/or diagnosis of SARS-CoV-2 by FDA under an Emergency Use Authorization (EUA). This EUA will remain  in effect (meaning this test can be  used) for the duration of the COVID-19 declaration under Section 564(b)(1) of the Act, 21 U.S.C.section 360bbb-3(b)(1), unless the authorization is terminated  or revoked sooner.       Influenza A by PCR NEGATIVE NEGATIVE Final   Influenza B by PCR NEGATIVE NEGATIVE Final    Comment: (NOTE) The Xpert Xpress SARS-CoV-2/FLU/RSV plus assay is intended as an aid in the diagnosis of influenza from Nasopharyngeal swab specimens and should not be used as a sole basis for treatment. Nasal washings and aspirates are unacceptable for Xpert Xpress SARS-CoV-2/FLU/RSV testing.  Fact Sheet for Patients: EntrepreneurPulse.com.au  Fact Sheet for Healthcare Providers: IncredibleEmployment.be  This test is not yet approved or cleared by the [place] FDA and has been authorized for detection and/or diagnosis of SARS-CoV-2 by FDA  under an Emergency Use Authorization (EUA). This EUA will remain in effect (meaning this test can be used) for the duration of the COVID-19 declaration under Section 564(b)(1) of the Act, 21 U.S.C. section 360bbb-3(b)(1), unless the authorization is terminated or revoked.  Performed at [facility], [address] [place], [place] [address]          Radiology Studies: CT Head Wo Contrast  Result Date: 04/17/2021 CLINICAL DATA:  Mental status change EXAM: CT HEAD WITHOUT CONTRAST TECHNIQUE: Contiguous axial images were obtained from the base of the skull through the vertex without intravenous contrast. COMPARISON:  CT brain 07/04/2018 FINDINGS: Brain: No hemorrhage is visualized. Hypodensity/edema within the left cerebellum and right posterior frontal subcortical white matter. No midline shift. Stable ventricle size. Vascular: No hyperdense vessels. Scattered carotid vascular calcification Skull: Normal. Negative for fracture or focal lesion. Sinuses/Orbits: No acute finding. Other: None IMPRESSION: 1. Focal  hypodensities/areas of edema within the right frontal lobe white matter and the left cerebellum, favored to represent neoplasm/metastatic disease over infarcts, given findings on recent body imaging. Negative for acute intracranial hemorrhage. Electronically Signed   By: Donavan Foil M.D.   On: 04/17/2021 16:33   DG Chest Port 1 View  Result Date: 04/17/2021 CLINICAL DATA:  Weakness.  Generalized abdominal pain. EXAM: PORTABLE CHEST 1 VIEW COMPARISON:  Radiograph 12/05/2016.  CT 04/13/2021 FINDINGS: Left-sided pacemaker in place. Normal heart size with stable mediastinal contours aortic atherosclerosis. Elevated right hemidiaphragm with adjacent compressive atelectasis. No acute airspace disease. No pulmonary edema or pleural effusion. No acute osseous abnormalities are seen. Presumed external artifact projects over the left upper thorax. IMPRESSION: No acute chest finding. Elevated right hemidiaphragm with adjacent compressive atelectasis. Electronically Signed   By: Keith Rake M.D.   On: 04/17/2021 16:15        Scheduled Meds:  apixaban  5 mg Oral BID   atorvastatin  80 mg Oral Daily   carvedilol  6.25 mg Oral BID WC   clopidogrel  75 mg Oral Daily   dexamethasone (DECADRON) injection  4 mg Intravenous Q6H   ezetimibe  10 mg Oral Daily   gabapentin  100 mg Oral BID   insulin aspart  0-15 Units Subcutaneous TID WC   insulin aspart  0-5 Units Subcutaneous QHS   loratadine  10 mg Oral Daily   pantoprazole  40 mg Oral q morning   cyanocobalamin  1,000 mcg Oral Daily   Continuous Infusions:   LOS: 0 days       Hosie Poisson, MD [facility]   To contact the attending provider between 7A-7P or the covering provider during after hours 7P-7A, please log into the web site [email or web address] and access using universal [place] password for that web site. If you do not have the password, please call the hospital operator.  04/18/2021, 7:46 AM

## 2021-04-19 ENCOUNTER — Telehealth: Payer: Self-pay

## 2021-04-19 DIAGNOSIS — R404 Transient alteration of awareness: Secondary | ICD-10-CM

## 2021-04-19 DIAGNOSIS — Z515 Encounter for palliative care: Secondary | ICD-10-CM | POA: Diagnosis not present

## 2021-04-19 LAB — GLUCOSE, CAPILLARY
Glucose-Capillary: 132 mg/dL — ABNORMAL HIGH (ref 70–99)
Glucose-Capillary: 123 mg/dL — ABNORMAL HIGH (ref 70–99)

## 2021-04-19 MED ORDER — DEXAMETHASONE 4 MG PO TABS: 8.0000 mg | ORAL_TABLET | Freq: Two times a day (BID) | ORAL | 0 refills | Status: DC

## 2021-04-19 MED ORDER — AMLODIPINE BESYLATE 5 MG PO TABS
5.0000 mg | ORAL_TABLET | Freq: Every day | ORAL | 1 refills | Status: DC
Start: 2021-04-20 — End: 2021-05-09

## 2021-04-19 NOTE — Evaluation (Signed)
Occupational Therapy Evaluation Patient Details Name: Anna Marsh MRN: [id number] DOB: 07-10-1946 Today's Date: 04/19/2021    History of Present Illness 75 y.o. female presents with AMS, increased confusion. 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   Clinical Impression   Upon entering the room, pt supine in bed with no c/o pain and agreeable to OT intervention. Pt reports living at home with daughter and other family members who are available at discharge to assist as needed. Pt demonstrated bed mobility, functional transfers, toileting, and standing grooming tasks at mod I level without use of AD and no LOB. OT did discuss energy conservation techniques for home with pt being an active member in discussion. Pt has all needed equipment. Pt does not require skilled acute OT at this time. OT to SIGN OFF. Thank you for this referral.     Follow Up Recommendations  No OT follow up;Supervision - Intermittent    Equipment Recommendations  None recommended by OT       Precautions / Restrictions Precautions Precautions: Fall Restrictions Weight Bearing Restrictions: No      Mobility Bed Mobility Overal bed mobility: Independent             General bed mobility comments: No assistance or additional time required.    Transfers Overall transfer level: Needs assistance Equipment used: None Transfers: Sit to/from [facility] Sit to Stand: Modified independent (Device/Increase time) Stand pivot transfers: Modified independent (Device/Increase time)       General transfer comment: no physical assistance or cuing for safety    Balance Overall balance assessment: Modified Independent;Mild deficits observed, not formally tested Sitting-balance support: Feet supported;No upper extremity supported   Sitting balance - Comments: Independent   Standing balance support: No  upper extremity supported   Standing balance comment: Increased postural sway when asked to remove UE support. Pt instinctively reached for counter upon initial stand; improved safety with RW.                           ADL either performed or assessed with clinical judgement   ADL Overall ADL's : Modified independent                                             Vision Patient Visual Report: No change from baseline              Pertinent Vitals/Pain Pain Assessment: No/denies pain     Hand Dominance Right   Extremity/Trunk Assessment Upper Extremity Assessment Upper Extremity Assessment: Overall WFL for tasks assessed   Lower Extremity Assessment Lower Extremity Assessment: Overall WFL for tasks assessed   Cervical / Trunk Assessment Cervical / Trunk Assessment: Normal   Communication Communication Communication: No difficulties   Cognition Arousal/Alertness: Awake/alert Behavior During Therapy: WFL for tasks assessed/performed Overall Cognitive Status: Within Functional Limits for tasks assessed                                 General Comments: A&Ox4; pt pleasant and cooperative   General Comments       Exercises Other Exercises Other Exercises: Ambulatory transfer from EOB > recliner, SUP for safety, no AD utilized, occasional UE support on bedrail  Home Living Family/patient expects to be discharged to:: Private residence Living Arrangements: Children Available Help at Discharge: Family Type of Home: House Home Access: Stairs to enter Technical brewer of Steps: 5 Entrance Stairs-Rails: Can reach both;Left;Right Home Layout: Multi-level Alternate Level Stairs-Number of Steps: split level; pt lives upstairs however daughter has offered to switch rooms with pt so pt can remain on main floor Alternate Level Stairs-Rails: Can reach both Bathroom Shower/Tub: Tub/shower unit;Walk-in shower          Home Equipment: Gilford Rile - 2 wheels;Cane - single point;Wheelchair - manual;Shower seat          Prior Functioning/Environment Level of Independence: Independent with assistive device(s)        Comments: Pt reports independence with self care tasks and daughter assists with IADL tasks per pt report.        OT Problem List:        OT Treatment/Interventions:      OT Goals(Current goals can be found in the care plan section) Acute Rehab OT Goals Patient Stated Goal: to go home OT Goal Formulation: With patient Time For Goal Achievement: 04/19/21 Potential to Achieve Goals: Good   AM-PAC OT "6 Clicks" Daily Activity     Outcome Measure Help from another person eating meals?: None Help from another person taking care of personal grooming?: None Help from another person toileting, which includes using toliet, bedpan, or urinal?: None Help from another person bathing (including washing, rinsing, drying)?: None Help from another person to put on and taking off regular upper body clothing?: None Help from another person to put on and taking off regular lower body clothing?: None 6 Click Score: 24   End of Session Nurse Communication: Mobility status  Activity Tolerance: Patient tolerated treatment well Patient left: in bed;with call bell/phone within reach  OT Visit Diagnosis: Unsteadiness on feet (R26.81)                Time: 9201-0071 OT Time Calculation (min): 22 min Charges:  OT General Charges $OT Visit: 1 Visit OT Evaluation $OT Eval Low Complexity: 1 Low OT Treatments $Self Care/Home Management : 8-22 mins  Darleen Crocker, MS, OTR/L , CBIS ascom [phone]  04/19/21, 11:46 AM

## 2021-04-19 NOTE — Evaluation (Signed)
Physical Therapy Evaluation Patient Details Name: Anna Marsh MRN: [id number] DOB: 08/12/1946 Today's Date: 04/19/2021   History of Present Illness  75 y.o. female presents with AMS, increased confusion. 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  Clinical Impression  Pt is a pleasant 75 y/o female admitted with AMS. Recent imaging shows liver and brain mets. Pt lives with her daughter in a multi-level home; daughter states pt is able to live on main floor if necessary. Pt performed bed mobility independently and OOB mobility including transfers and ambulation with SUP for safety. RW encouraged. Education provided on risks of reaching out to furniture when walking in room. Strength, ROM and sensation appear WFL. Mild deficits noted in standing balance and gait. Pt is to begin radiation treatment today, 7/19, and d/c home with hospice care. She appears to be functioning near her baseline prior to arrival. She has all DME required for safe mobility in the home setting. Pt would benefit from skilled PT in this setting to address above deficits and promote optimal return to PLOF. Not recommending further PT services post discharge.      Follow Up Recommendations No PT follow up    Equipment Recommendations  None recommended by PT    Recommendations for Other Services       Precautions / Restrictions Precautions Precautions: Fall Restrictions Weight Bearing Restrictions: No      Mobility  Bed Mobility Overal bed mobility: Independent             General bed mobility comments: No assistance or additional time required.    Transfers Overall transfer level: Needs assistance   Transfers: Sit to/from Stand Sit to Stand: Supervision         General transfer comment: SUP for safety  Ambulation/Gait Ambulation/Gait assistance: Supervision Gait Distance (Feet): 200 Feet Assistive  device: Rolling walker (2 wheeled) Gait Pattern/deviations: Trunk flexed;Narrow base of support;Step-through pattern;Decreased step length - right;Decreased step length - left;Decreased stride length     General Gait Details: Increased WB through BUE; short, fast steps; close SUP for safety.  Stairs            Wheelchair Mobility    Modified Rankin (Stroke Patients Only)       Balance Overall balance assessment: Modified Independent;Mild deficits observed, not formally tested Sitting-balance support: Feet supported;No upper extremity supported   Sitting balance - Comments: Independent   Standing balance support: No upper extremity supported   Standing balance comment: Increased postural sway when asked to remove UE support. Pt instinctively reached for counter upon initial stand; improved safety with RW.                             Pertinent Vitals/Pain Pain Assessment: No/denies pain    Home Living Family/patient expects to be discharged to:: Private residence Living Arrangements: Children Available Help at Discharge: Family Type of Home: House Home Access: Stairs to enter Entrance Stairs-Rails: Can reach both;Left;Right Entrance Stairs-Number of Steps: 5 Home Layout: Multi-level Home Equipment: Environmental consultant - 2 wheels;Cane - single point;Wheelchair - manual      Prior Function Level of Independence: Needs assistance         Comments: Was independent with ADLs until recently - daughter is now assisting, pt does help with cleaning, etc. Pt is independent with dressing/bathing.     Hand Dominance   Dominant Hand: Right  Extremity/Trunk Assessment   Upper Extremity Assessment Upper Extremity Assessment: Overall WFL for tasks assessed    Lower Extremity Assessment Lower Extremity Assessment: Overall WFL for tasks assessed (BLE ranged from 4/5 to 5/5 on MMT. Full ROM. Reports fully intact sensation to LT.)       Communication   Communication:  No difficulties  Cognition Arousal/Alertness: Awake/alert Behavior During Therapy: WFL for tasks assessed/performed Overall Cognitive Status: Within Functional Limits for tasks assessed                                 General Comments: A&Ox4; no difficulty providing history      General Comments      Exercises Other Exercises Other Exercises: Ambulatory transfer from EOB > recliner, SUP for safety, no AD utilized, occasional UE support on bedrail   Assessment/Plan    PT Assessment Patient needs continued PT services  PT Problem List Decreased strength;Decreased activity tolerance;Decreased balance;Decreased mobility       PT Treatment Interventions DME instruction;Therapeutic activities;Gait training;Therapeutic exercise;Patient/family education;Stair training;Balance training;Functional mobility training    PT Goals (Current goals can be found in the Care Plan section)  Acute Rehab PT Goals Patient Stated Goal: "anything I can do better" PT Goal Formulation: With patient Time For Goal Achievement: 05/03/21 Potential to Achieve Goals: Fair    Frequency Min 2X/week   Barriers to discharge   none    Co-evaluation               AM-PAC PT "6 Clicks" Mobility  Outcome Measure Help needed turning from your back to your side while in a flat bed without using bedrails?: None Help needed moving from lying on your back to sitting on the side of a flat bed without using bedrails?: None Help needed moving to and from a bed to a chair (including a wheelchair)?: A Little Help needed standing up from a chair using your arms (e.g., wheelchair or bedside chair)?: A Little Help needed to walk in hospital room?: A Little Help needed climbing 3-5 steps with a railing? : A Little 6 Click Score: 20    End of Session Equipment Utilized During Treatment: Gait belt Activity Tolerance: Patient tolerated treatment well Patient left: in chair;with call bell/phone within  reach;Other (comment) (RN notified of no chair alarm) Nurse Communication: Mobility status PT Visit Diagnosis: Unsteadiness on feet (R26.81);Other abnormalities of gait and mobility (R26.89)    Time: 3875-6433 PT Time Calculation (min) (ACUTE ONLY): 25 min   Charges:   PT Evaluation $PT Eval Low Complexity: 1 Low PT Treatments $Therapeutic Activity: 8-22 mins        Patrina Levering PT, DPT 04/19/21 10:01 AM [phone]   Anna Marsh 04/19/2021, 10:01 AM

## 2021-04-19 NOTE — Consult Note (Signed)
[facility]  Telephone:([phone] [phone]   Name: Anna Marsh Date: 04/19/2021 MRN: [id number]  DOB: [birth year]  Patient Care Team: Jerrol Banana., MD as PCP - General (Family Medicine)    REASON FOR CONSULTATION: Anna Marsh is a 76 y.o. female with multiple medical problems including hypertension, hyperlipidemia, GERD, history of TIAs, recent PE, who was found to have presumed pancreatic cancer with liver metastasis on recent CT.  Work-up is ongoing.  She was admitted to hospital 04/17/2021 with altered mental status.  CT of the head revealed focal hypodensities/edema in the right frontal lobe and left cerebellum concerning for metastatic disease.  She was started on steroids with plan for whole brain radiation.  Palliative care was consulted to address goals.  SOCIAL HISTORY:     reports that she quit smoking about 17 years ago. Her smoking use included cigarettes. She has a 10.00 pack-year smoking history. She has never used smokeless tobacco. She reports that she does not drink alcohol and does not use drugs.  Patient is widowed.  She lives at home with her daughter.  Patient previously worked for an [employer].  ADVANCE DIRECTIVES:  None on file  CODE STATUS: DNR  PAST MEDICAL HISTORY: Past Medical History:  Diagnosis Date   AICD (automatic cardioverter/defibrillator) present    Anemia    Anxiety    AV block, complete (HCC)    Cervical radiculopathy    CHF (congestive heart failure) (HCC)    Chronic systolic heart failure (HCC)    GERD (gastroesophageal reflux disease)    History of cardiac arrest    Hypercholesteremia    Hypertension    Pancreatic cancer (HCC)    stage 4   Pre-diabetes    Presence of permanent cardiac pacemaker 02/03/2015   [facility]  with AICD   Right arm weakness     PAST SURGICAL HISTORY:  Past Surgical History:  Procedure Laterality Date   ABDOMINAL  HYSTERECTOMY     CATARACT EXTRACTION W/PHACO Left 12/18/2016   Procedure: CATARACT EXTRACTION PHACO AND INTRAOCULAR LENS PLACEMENT ([place])  left;  Surgeon: Ronnell Freshwater, MD;  Location: [facility];  Service: Ophthalmology;  Laterality: Left;   CATARACT EXTRACTION W/PHACO Right 01/01/2017   Procedure: CATARACT EXTRACTION PHACO AND INTRAOCULAR LENS PLACEMENT ([place])  Right;  Surgeon: Ronnell Freshwater, MD;  Location: [facility];  Service: Ophthalmology;  Laterality: Right;   HERNIA REPAIR     umbilical x 2   NECK SURGERY     PACEMAKER IMPLANT  02/03/2015   [facility]   TONSILLECTOMY      HEMATOLOGY/ONCOLOGY HISTORY:  Oncology History   No history exists.    ALLERGIES:  is allergic to iodinated diagnostic agents, iodine, lisinopril, and penicillins.  MEDICATIONS:  Current Facility-Administered Medications  Medication Dose Route Frequency Provider Last Rate Last Admin   acetaminophen (TYLENOL) tablet 650 mg  650 mg Oral Q6H PRN Cox, Amy N, DO       Or   acetaminophen (TYLENOL) suppository 650 mg  650 mg Rectal Q6H PRN Cox, Amy N, DO       amLODipine (NORVASC) tablet 5 mg  5 mg Oral Daily Hosie Poisson, MD   5 mg at 04/19/21 0920   apixaban (ELIQUIS) tablet 5 mg  5 mg Oral BID Cox, Amy N, DO   5 mg at 04/19/21 0920   atorvastatin (LIPITOR) tablet 80 mg  80 mg Oral Daily Cox, Amy N,  DO   80 mg at 04/19/21 0920   butalbital-acetaminophen-caffeine (FIORICET) 50-325-40 MG per tablet 1 tablet  1 tablet Oral Q4H PRN Cox, Amy N, DO   1 tablet at 04/18/21 2008   carvedilol (COREG) tablet 6.25 mg  6.25 mg Oral BID WC Cox, Amy N, DO   6.25 mg at 04/19/21 0920   clopidogrel (PLAVIX) tablet 75 mg  75 mg Oral Daily Cox, Amy N, DO   75 mg at 04/19/21 0920   dexamethasone (DECADRON) injection 4 mg  4 mg Intravenous Q6H Cox, Amy N, DO   4 mg at 04/19/21 1303   ezetimibe (ZETIA) tablet 10 mg  10 mg Oral Daily Cox, Amy N, DO   10 mg at 04/19/21 6333   gabapentin (NEURONTIN) capsule  100 mg  100 mg Oral BID Cox, Amy N, DO   100 mg at 04/19/21 0920   hydrALAZINE (APRESOLINE) tablet 25 mg  25 mg Oral Q8H PRN Hosie Poisson, MD   25 mg at 04/18/21 2053   insulin aspart (novoLOG) injection 0-15 Units  0-15 Units Subcutaneous TID WC Cox, Amy N, DO   2 Units at 04/19/21 1303   insulin aspart (novoLOG) injection 0-5 Units  0-5 Units Subcutaneous QHS Cox, Amy N, DO       loratadine (CLARITIN) tablet 10 mg  10 mg Oral Daily Cox, Amy N, DO   10 mg at 04/19/21 0920   LORazepam (ATIVAN) injection 2 mg  2 mg Intravenous Q4H PRN Cox, Amy N, DO       nitroGLYCERIN (NITROSTAT) SL tablet 0.4 mg  0.4 mg Sublingual Q5 min PRN Cox, Amy N, DO       ondansetron (ZOFRAN) tablet 4 mg  4 mg Oral Q6H PRN Cox, Amy N, DO       Or   ondansetron (ZOFRAN) injection 4 mg  4 mg Intravenous Q6H PRN Cox, Amy N, DO       pantoprazole (PROTONIX) EC tablet 40 mg  40 mg Oral q morning Cox, Amy N, DO   40 mg at 04/19/21 1324   vitamin B-12 (CYANOCOBALAMIN) tablet 1,000 mcg  1,000 mcg Oral Daily Cox, Amy N, DO   1,000 mcg at 04/19/21 0920    VITAL SIGNS: BP (!) 160/65 (BP Location: Right Arm)   Pulse (!) 57   Temp 97.7 F (36.5 C)   Resp 18   Ht 5' 3"  (1.6 m)   Wt 273 lb 9.5 oz (124.1 kg)   SpO2 98%   BMI 48.46 kg/m  Filed Weights   04/17/21 1240 04/17/21 2205  Weight: 276 lb 0.3 oz (125.2 kg) 273 lb 9.5 oz (124.1 kg)    Estimated body mass index is 48.46 kg/m as calculated from the following:   Height as of this encounter: 5' 3"  (1.6 m).   Weight as of this encounter: 273 lb 9.5 oz (124.1 kg).  LABS: CBC:    Component Value Date/Time   WBC 7.1 04/18/2021 0629   HGB 10.6 (L) 04/18/2021 0629   HGB 10.0 (L) 04/07/2021 0945   HCT 33.0 (L) 04/18/2021 0629   HCT 30.5 (L) 04/07/2021 0945   PLT 139 (L) 04/18/2021 0629   PLT 222 04/07/2021 0945   MCV 82.3 04/18/2021 0629   MCV 83 04/07/2021 0945   MCV 88 09/29/2014 1128   NEUTROABS 5.9 04/07/2021 0945   NEUTROABS 3.8 09/29/2014 1128   LYMPHSABS  0.9 04/07/2021 0945   LYMPHSABS 1.2 09/29/2014 1128   MONOABS 0.3  07/02/2018 1139   MONOABS 0.4 09/29/2014 1128   EOSABS 0.3 04/07/2021 0945   EOSABS 0.2 09/29/2014 1128   BASOSABS 0.1 04/07/2021 0945   BASOSABS 0.0 09/29/2014 1128   Comprehensive Metabolic Panel:    Component Value Date/Time   NA 138 04/18/2021 0629   NA 142 04/07/2021 0945   NA 140 09/29/2014 1128   K 4.0 04/18/2021 0629   K 3.6 09/29/2014 1128   CL 105 04/18/2021 0629   CL 103 09/29/2014 1128   CO2 26 04/18/2021 0629   CO2 32 09/29/2014 1128   BUN 20 04/18/2021 0629   BUN 14 04/07/2021 0945   BUN 16 09/29/2014 1128   CREATININE 1.05 (H) 04/18/2021 0629   CREATININE 1.10 09/29/2014 1128   GLUCOSE 137 (H) 04/18/2021 0629   GLUCOSE 84 09/29/2014 1128   CALCIUM 8.7 (L) 04/18/2021 0629   CALCIUM 9.2 09/29/2014 1128   AST 35 04/17/2021 1246   ALT 36 04/17/2021 1246   ALKPHOS 119 04/17/2021 1246   BILITOT 0.8 04/17/2021 1246   BILITOT 0.4 04/07/2021 0945   PROT 6.2 (L) 04/17/2021 1246   PROT 5.6 (L) 04/07/2021 0945   ALBUMIN 3.0 (L) 04/17/2021 1246   ALBUMIN 3.4 (L) 04/07/2021 0945    RADIOGRAPHIC STUDIES: CT Head Wo Contrast  Result Date: 04/17/2021 CLINICAL DATA:  Mental status change EXAM: CT HEAD WITHOUT CONTRAST TECHNIQUE: Contiguous axial images were obtained from the base of the skull through the vertex without intravenous contrast. COMPARISON:  CT brain 07/04/2018 FINDINGS: Brain: No hemorrhage is visualized. Hypodensity/edema within the left cerebellum and right posterior frontal subcortical white matter. No midline shift. Stable ventricle size. Vascular: No hyperdense vessels. Scattered carotid vascular calcification Skull: Normal. Negative for fracture or focal lesion. Sinuses/Orbits: No acute finding. Other: None IMPRESSION: 1. Focal hypodensities/areas of edema within the right frontal lobe white matter and the left cerebellum, favored to represent neoplasm/metastatic disease over infarcts, given  findings on recent body imaging. Negative for acute intracranial hemorrhage. Electronically Signed   By: Donavan Foil M.D.   On: 04/17/2021 16:33   CT CHEST ABDOMEN PELVIS W CONTRAST  Result Date: 04/13/2021 CLINICAL DATA:  75 year old female with history of multiple liver lesions noted on prior outside CT examination concerning for metastatic disease to the liver. Follow-up study. EXAM: CT CHEST, ABDOMEN, AND PELVIS WITH CONTRAST TECHNIQUE: Multidetector CT imaging of the chest, abdomen and pelvis was performed following the standard protocol during bolus administration of intravenous contrast. CONTRAST:  148m OMNIPAQUE IOHEXOL 350 MG/ML SOLN COMPARISON:  CT of the abdomen and pelvis 08/14/2014. No prior chest CT. FINDINGS: CT CHEST FINDINGS Cardiovascular: Heart size is normal. There is no significant pericardial fluid, thickening or pericardial calcification. There is aortic atherosclerosis, as well as atherosclerosis of the great vessels of the mediastinum and the coronary arteries, including calcified atherosclerotic plaque in the left anterior descending and right coronary arteries. Calcifications of the mitral annulus. Left-sided biventricular pacemaker device in place with lead tips terminating in the right atrium, right ventricular apex and overlying the lateral wall the left ventricle via the coronary sinus and coronary veins. Mediastinum/Nodes: No pathologically enlarged mediastinal or hilar lymph nodes. Esophagus is unremarkable in appearance. No axillary lymphadenopathy. Lungs/Pleura: Elevation of the right hemidiaphragm. Some associated passive subsegmental atelectasis in the basal segments of the right lower lobe. No acute consolidative airspace disease. No pleural effusions. No definite suspicious appearing pulmonary nodules or masses are noted. Musculoskeletal: There are no aggressive appearing lytic or blastic lesions noted in the  visualized portions of the skeleton. CT ABDOMEN PELVIS  FINDINGS Hepatobiliary: Multiple hypovascular lesions are scattered throughout the hepatic parenchyma, likely reflective of widespread metastatic disease. The largest of these is in the left lobe of the liver straddling segments 2, 3, 4A and 4B (axial image 47 of series 2) measuring 5.2 x 3.7 cm. No intra or extrahepatic biliary ductal dilatation. Numerous tiny calcified gallstones lying dependently in the gallbladder. Gallbladder is not distended. No gallbladder wall thickening or pericholecystic fluid or surrounding inflammatory changes. Pancreas: In the tail of the pancreas there is a large hypovascular mass measuring 5.0 x 3.6 x 3.6 cm (coronal image 77 of series 6 and axial image 51 of series 2). Some curvilinear calcifications are noted along the posterior and superior wall of this lesion. This lesion makes contact with the undersurface of the proximal stomach with some obscuration of the intervening fat plane (best appreciated on coronal image 78 of series 6). This lesion is well separated from the superior mesenteric artery and vein as well as the portal vein. This lesion is in close proximity to the splenic artery and splenic vein. Splenic vein appears markedly narrowed related to the lesion, but appears grossly patent at this time. No pancreatic ductal dilatation. No peripancreatic fluid collections or inflammatory changes. Spleen: Unremarkable. Adrenals/Urinary Tract: Multiple low-attenuation lesions in both kidneys, compatible with simple cysts, largest of which measures 12.0 x 8.5 cm in the upper pole of the left kidney. No hydroureteronephrosis. Urinary bladder is nearly decompressed, but otherwise unremarkable in appearance. Stomach/Bowel: The appearance of the stomach is normal. No pathologic dilatation of small bowel or colon. Normal appendix. Vascular/Lymphatic: Aortic atherosclerosis, without evidence of aneurysm or dissection in the abdominal or pelvic vasculature. No lymphadenopathy noted in the  abdomen or pelvis. Reproductive: Status post hysterectomy.  Ovaries are atrophic. Other: No significant volume of ascites.  No pneumoperitoneum. Musculoskeletal: There are no aggressive appearing lytic or blastic lesions noted in the visualized portions of the skeleton. IMPRESSION: 1. Large hypovascular mass in the tail of the pancreas concerning for primary pancreatic malignancy. This is intimately associated with the undersurface of the proximal stomach without definite direct invasion at this time. This is also intimately associated with the splenic artery and vein, and there are numerous hypovascular hepatic lesions concerning for widespread metastatic disease to the liver. Further evaluation with endoscopic ultrasound and consideration for biopsy is strongly recommended. 2. No definitive findings to suggest metastatic disease to the thorax. 3. No evidence of metastatic disease in the pelvis. 4. Cholelithiasis without evidence of acute cholecystitis at this time. 5. Aortic atherosclerosis, in addition to 2 vessel coronary artery disease. Assessment for potential risk factor modification, dietary therapy or pharmacologic therapy may be warranted, if clinically indicated. 6. There are calcifications of the mitral annulus. Echocardiographic correlation for evaluation of potential valvular dysfunction may be warranted if clinically indicated. 7. Additional incidental findings, as above. Electronically Signed   By: Vinnie Langton M.D.   On: 04/13/2021 11:29   DG Chest Port 1 View  Result Date: 04/17/2021 CLINICAL DATA:  Weakness.  Generalized abdominal pain. EXAM: PORTABLE CHEST 1 VIEW COMPARISON:  Radiograph 12/05/2016.  CT 04/13/2021 FINDINGS: Left-sided pacemaker in place. Normal heart size with stable mediastinal contours aortic atherosclerosis. Elevated right hemidiaphragm with adjacent compressive atelectasis. No acute airspace disease. No pulmonary edema or pleural effusion. No acute osseous  abnormalities are seen. Presumed external artifact projects over the left upper thorax. IMPRESSION: No acute chest finding. Elevated right hemidiaphragm with adjacent compressive  atelectasis. Electronically Signed   By: Keith Rake M.D.   On: 04/17/2021 16:15    PERFORMANCE STATUS (ECOG) : 1 - Symptomatic but completely ambulatory  Review of Systems Unless otherwise noted, a complete review of systems is negative.  Physical Exam General: NAD Pulmonary: Unlabored Extremities: no edema, no joint deformities Skin: no rashes Neurological: Weakness but otherwise nonfocal  IMPRESSION: I met with patient and daughter.  I introduced palliative care services and attempted to establish therapeutic rapport.  Patient has been previously confused but this is improved after initiation of of steroids.  Today, she is readily able to engage in a conversation regarding her goals.  Patient is understanding of her cancer diagnosis and treatment plan.  She saw radiation oncology yesterday with plan for CT simulation on 7/21.  Following XRT, patient seemed willing to pursue systemic chemotherapy.  Currently she denies distressing symptoms.  Plan is for discharge home later today.  We discussed home resources in detail.  PT saw her today and did not recommend additional outpatient follow-up.  We can readdress option of home health services in the outpatient setting if needed.  Daughter plans to talk with family/friends to see if she can arrange consider sitters as daughter is still working full-time.  We also discussed utilization of a medical alert system.  PLAN: -Continue current scope of treatment -Dispo: Home this afternoon -DNR -Will follow in the clinic   Time Total: 60 minutes  Visit consisted of counseling and education dealing with the complex and emotionally intense issues of symptom management and palliative care in the setting of serious and potentially life-threatening illness.Greater than  50%  of this time was spent counseling and coordinating care related to the above assessment and plan.  Signed by: Altha Harm, PhD, NP-C

## 2021-04-19 NOTE — ACP (Advance Care Planning) (Signed)
PT has an AD as of 04/19/21  Point of contact:  Joycelyn Schmid "[name]" [place] [phone]) ~ PT's daughter  - Chaplain Chana Bode, M.Div.

## 2021-04-19 NOTE — Telephone Encounter (Signed)
FYI

## 2021-04-19 NOTE — TOC Progression Note (Signed)
Transition of Care [facility]) - Progression Note    Patient Details  Name: Anna Marsh MRN: [id number] Date of Birth: 02/17/1946  Transition [facility]) CM/SW [place], RN Phone Number: 04/19/2021, 1:32 PM  Clinical Narrative:  Patient lives at home with her daughter, who is available to help and support patient.  Patient has no concerns about returning home after discharge.  Patient informs TOC that she will need to go to the cancer center every day, states daughter can transport her most times, and they have a supportive network for days that daughter cannot transport.  Patient states she has medications mailed to her and is able to take all medications as directed.  She does not have any current home health services, and states she does not feel she will need services at this time.       Expected Discharge Plan and Services           Expected Discharge Date: 04/19/21                                     Social Determinants of Health (SDOH) Interventions    Readmission Risk Interventions No flowsheet data found.

## 2021-04-19 NOTE — Progress Notes (Signed)
   04/19/21 0815  Clinical Encounter Type  Visited With Patient  Visit Type Follow-up;Spiritual support;Social support  Referral From Chaplain  Consult/Referral To [place] completed an AD with notary and witness, as promised the day before. Chaplain made space for PT to express her emotions, and thoughts. Storytelling, and compassionate presence was utilized in this visit. PT spoke of her upbring and spoke of her love for [place], her daughter, and grandson. She spoke of being proud of her grandson who now has a Printmaker job at [employer]. Chaplain empathized a Bonney [place] is available when needed.

## 2021-04-19 NOTE — Discharge Summary (Signed)
Physician Discharge Summary  Anna Marsh JTT:[id number] DOB: Mar 25, 1946 DOA: 04/17/2021  PCP: Jerrol Banana., MD  Admit date: 04/17/2021 Discharge date: 04/19/2021  Admitted From: Home.  Disposition:  Home.   Recommendations for Outpatient Follow-up:  Follow up with PCP in 1-2 weeks Please obtain BMP/CBC in one week Please follow up with oncology and Radiation oncology as recommended.  Please follow up with palliative care as outpatient.   Discharge Condition:stable.  CODE STATUS: DNR Diet recommendation: Heart Healthy   Brief/Interim Summary:  Anna Marsh is a 75 y.o. female with medical history significant for GERD, hyperlipidemia, hypertension, presents to the emergency department for chief concerns of confusion. 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 Oncology and palliative care consulted for goals of care.   Discharge Diagnoses:  Principal Problem:   Altered mental status Active Problems:   GERD (gastroesophageal reflux disease)   Hypercholesterolemia   Hypertension   Cardiac defibrillator in place   Adiposity   Chronic systolic CHF (congestive heart failure), NYHA class 3 (HCC)   Acid reflux   TIA (transient ischemic attack)   Brain metastases (HCC)   Pancreatic mass   Syncope   Liver metastases (HCC)   Acute encephalopathy secondary to brain mets: Improving with IV Decadron 4 mg EVERY 6 HOURS, pt appears to be back to her baseline as per her daughter.   Discharged her on oral decadron 8 mg BID as per oncology recommendations.  Oncology, radiation oncology and palliative care consulted and recommendations given.       Recently diagnosed metastatic pancreatic cancer: - oncology on board.      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     Hypertension: Better controlled.      Chronic systolic CHF; S/p AICD; Pt currently denies any chest pain or sob.       TIA: On plavix.     Hyperlipidemia: On statin. Continue  the same.     GERD; Stable.    Abdominal pain: Probably from liver mets. Resolved at this time.     Anemia of chronic disease: Hemoglobin stable around 10.       Discharge Instructions  Discharge Instructions     Diet - low sodium heart healthy   Complete by: As directed    Discharge instructions   Complete by: As directed    Please follow up with PCP in one week.  Please follow up with radiation oncology and oncologist as recommended.      Allergies as of 04/19/2021       Reactions   Iodinated Diagnostic Agents Other (See Comments)   Causes migraines   Iodine    Contrast Dye causes migraines   Lisinopril Cough   Penicillins Rash   Has patient had a PCN reaction causing immediate rash, facial/tongue/throat swelling, SOB or lightheadedness with hypotension: Yes Has patient had a PCN reaction causing severe rash involving mucus membranes or skin necrosis: No Has patient had a PCN reaction that required hospitalization: No Has patient had a PCN reaction occurring within the last 10 years: Yes If all of the above answers are "NO", then may proceed with Cephalosporin use.        Medication List     STOP taking these medications    predniSONE 50 MG tablet Commonly known as: DELTASONE       TAKE these medications    acetaminophen 500 MG tablet Commonly known as: TYLENOL Take 1,000 mg by mouth every 6 (  six) hours as needed for mild pain or moderate pain.   amLODipine 5 MG tablet Commonly known as: NORVASC Take 1 tablet (5 mg total) by mouth daily. Start taking on: April 20, 2021   apixaban 5 MG Tabs tablet Commonly known as: ELIQUIS Take 5 mg by mouth 2 (two) times daily.   atorvastatin 80 MG tablet Commonly known as: LIPITOR TAKE 1 TABLET BY MOUTH  DAILY   butalbital-acetaminophen-caffeine 50-325-40 MG tablet Commonly known as: FIORICET Take 1 tablet by mouth every 4 (four) hours as needed for headache.   carvedilol 6.25 MG tablet Commonly  known as: COREG Take 6.25 mg by mouth 2 (two) times daily with a meal.   clopidogrel 75 MG tablet Commonly known as: PLAVIX Take 75 mg by mouth daily.   cyanocobalamin 1000 MCG tablet Take 1,000 mcg by mouth daily.   dexamethasone 4 MG tablet Commonly known as: DECADRON Take 2 tablets (8 mg total) by mouth 2 (two) times daily.   diphenhydrAMINE 25 mg capsule Commonly known as: BENADRYL Take 25 mg by mouth See admin instructions. Take 25 mg by mouth 1 hour prior to procedure involving contrast   ezetimibe 10 MG tablet Commonly known as: ZETIA Take 10 mg by mouth daily.   Ferrous Sulfate Dried 200 (65 Fe) MG Tabs Take 1 tablet by mouth daily.   furosemide 20 MG tablet Commonly known as: LASIX TAKE 1 TABLET BY MOUTH  DAILY AS NEEDED What changed: reasons to take this   gabapentin 100 MG capsule Commonly known as: NEURONTIN TAKE 2 CAPSULES BY MOUTH AT BEDTIME What changed:  how much to take when to take this   loratadine 10 MG tablet Commonly known as: CLARITIN Take 10 mg by mouth daily.   nitroGLYCERIN 0.4 MG SL tablet Commonly known as: NITROSTAT Place 1 tablet (0.4 mg total) under the tongue every 5 (five) minutes as needed for chest pain.   pantoprazole 40 MG tablet Commonly known as: PROTONIX TAKE 1 TABLET BY MOUTH IN  THE MORNING What changed: when to take this   potassium chloride 10 MEQ tablet Commonly known as: KLOR-CON TAKE 1 TABLET BY MOUTH  TWICE DAILY   promethazine 25 MG tablet Commonly known as: PHENERGAN Take 1/2-1 tablet by mouth every 8 hours as needed for nausea. What changed:  how much to take how to take this when to take this additional instructions   traMADol 50 MG tablet Commonly known as: ULTRAM 1/2 to 1 pill every 6-8 hours for pain. What changed:  how much to take how to take this when to take this additional instructions        Allergies  Allergen Reactions   Iodinated Diagnostic Agents Other (See Comments)     Causes migraines   Iodine     Contrast Dye causes migraines   Lisinopril Cough   Penicillins Rash    Has patient had a PCN reaction causing immediate rash, facial/tongue/throat swelling, SOB or lightheadedness with hypotension: Yes Has patient had a PCN reaction causing severe rash involving mucus membranes or skin necrosis: No Has patient had a PCN reaction that required hospitalization: No Has patient had a PCN reaction occurring within the last 10 years: Yes If all of the above answers are "NO", then may proceed with Cephalosporin use.    Consultations: Oncology  radiation oncology Palliative care   Procedures/Studies: CT Head Wo Contrast  Result Date: 04/17/2021 CLINICAL DATA:  Mental status change EXAM: CT HEAD WITHOUT CONTRAST TECHNIQUE: Contiguous axial images  were obtained from the base of the skull through the vertex without intravenous contrast. COMPARISON:  CT brain 07/04/2018 FINDINGS: Brain: No hemorrhage is visualized. Hypodensity/edema within the left cerebellum and right posterior frontal subcortical white matter. No midline shift. Stable ventricle size. Vascular: No hyperdense vessels. Scattered carotid vascular calcification Skull: Normal. Negative for fracture or focal lesion. Sinuses/Orbits: No acute finding. Other: None IMPRESSION: 1. Focal hypodensities/areas of edema within the right frontal lobe white matter and the left cerebellum, favored to represent neoplasm/metastatic disease over infarcts, given findings on recent body imaging. Negative for acute intracranial hemorrhage. Electronically Signed   By: Donavan Foil M.D.   On: 04/17/2021 16:33   CT CHEST ABDOMEN PELVIS W CONTRAST  Result Date: 04/13/2021 CLINICAL DATA:  75 year old female with history of multiple liver lesions noted on prior outside CT examination concerning for metastatic disease to the liver. Follow-up study. EXAM: CT CHEST, ABDOMEN, AND PELVIS WITH CONTRAST TECHNIQUE: Multidetector CT imaging of  the chest, abdomen and pelvis was performed following the standard protocol during bolus administration of intravenous contrast. CONTRAST:  134mL OMNIPAQUE IOHEXOL 350 MG/ML SOLN COMPARISON:  CT of the abdomen and pelvis 08/14/2014. No prior chest CT. FINDINGS: CT CHEST FINDINGS Cardiovascular: Heart size is normal. There is no significant pericardial fluid, thickening or pericardial calcification. There is aortic atherosclerosis, as well as atherosclerosis of the great vessels of the mediastinum and the coronary arteries, including calcified atherosclerotic plaque in the left anterior descending and right coronary arteries. Calcifications of the mitral annulus. Left-sided biventricular pacemaker device in place with lead tips terminating in the right atrium, right ventricular apex and overlying the lateral wall the left ventricle via the coronary sinus and coronary veins. Mediastinum/Nodes: No pathologically enlarged mediastinal or hilar lymph nodes. Esophagus is unremarkable in appearance. No axillary lymphadenopathy. Lungs/Pleura: Elevation of the right hemidiaphragm. Some associated passive subsegmental atelectasis in the basal segments of the right lower lobe. No acute consolidative airspace disease. No pleural effusions. No definite suspicious appearing pulmonary nodules or masses are noted. Musculoskeletal: There are no aggressive appearing lytic or blastic lesions noted in the visualized portions of the skeleton. CT ABDOMEN PELVIS FINDINGS Hepatobiliary: Multiple hypovascular lesions are scattered throughout the hepatic parenchyma, likely reflective of widespread metastatic disease. The largest of these is in the left lobe of the liver straddling segments 2, 3, 4A and 4B (axial image 47 of series 2) measuring 5.2 x 3.7 cm. No intra or extrahepatic biliary ductal dilatation. Numerous tiny calcified gallstones lying dependently in the gallbladder. Gallbladder is not distended. No gallbladder wall thickening or  pericholecystic fluid or surrounding inflammatory changes. Pancreas: In the tail of the pancreas there is a large hypovascular mass measuring 5.0 x 3.6 x 3.6 cm (coronal image 77 of series 6 and axial image 51 of series 2). Some curvilinear calcifications are noted along the posterior and superior wall of this lesion. This lesion makes contact with the undersurface of the proximal stomach with some obscuration of the intervening fat plane (best appreciated on coronal image 78 of series 6). This lesion is well separated from the superior mesenteric artery and vein as well as the portal vein. This lesion is in close proximity to the splenic artery and splenic vein. Splenic vein appears markedly narrowed related to the lesion, but appears grossly patent at this time. No pancreatic ductal dilatation. No peripancreatic fluid collections or inflammatory changes. Spleen: Unremarkable. Adrenals/Urinary Tract: Multiple low-attenuation lesions in both kidneys, compatible with simple cysts, largest of which measures 12.0 x  8.5 cm in the upper pole of the left kidney. No hydroureteronephrosis. Urinary bladder is nearly decompressed, but otherwise unremarkable in appearance. Stomach/Bowel: The appearance of the stomach is normal. No pathologic dilatation of small bowel or colon. Normal appendix. Vascular/Lymphatic: Aortic atherosclerosis, without evidence of aneurysm or dissection in the abdominal or pelvic vasculature. No lymphadenopathy noted in the abdomen or pelvis. Reproductive: Status post hysterectomy.  Ovaries are atrophic. Other: No significant volume of ascites.  No pneumoperitoneum. Musculoskeletal: There are no aggressive appearing lytic or blastic lesions noted in the visualized portions of the skeleton. IMPRESSION: 1. Large hypovascular mass in the tail of the pancreas concerning for primary pancreatic malignancy. This is intimately associated with the undersurface of the proximal stomach without definite direct  invasion at this time. This is also intimately associated with the splenic artery and vein, and there are numerous hypovascular hepatic lesions concerning for widespread metastatic disease to the liver. Further evaluation with endoscopic ultrasound and consideration for biopsy is strongly recommended. 2. No definitive findings to suggest metastatic disease to the thorax. 3. No evidence of metastatic disease in the pelvis. 4. Cholelithiasis without evidence of acute cholecystitis at this time. 5. Aortic atherosclerosis, in addition to 2 vessel coronary artery disease. Assessment for potential risk factor modification, dietary therapy or pharmacologic therapy may be warranted, if clinically indicated. 6. There are calcifications of the mitral annulus. Echocardiographic correlation for evaluation of potential valvular dysfunction may be warranted if clinically indicated. 7. Additional incidental findings, as above. Electronically Signed   By: Vinnie Langton M.D.   On: 04/13/2021 11:29   DG Chest Port 1 View  Result Date: 04/17/2021 CLINICAL DATA:  Weakness.  Generalized abdominal pain. EXAM: PORTABLE CHEST 1 VIEW COMPARISON:  Radiograph 12/05/2016.  CT 04/13/2021 FINDINGS: Left-sided pacemaker in place. Normal heart size with stable mediastinal contours aortic atherosclerosis. Elevated right hemidiaphragm with adjacent compressive atelectasis. No acute airspace disease. No pulmonary edema or pleural effusion. No acute osseous abnormalities are seen. Presumed external artifact projects over the left upper thorax. IMPRESSION: No acute chest finding. Elevated right hemidiaphragm with adjacent compressive atelectasis. Electronically Signed   By: Keith Rake M.D.   On: 04/17/2021 16:15      Subjective: No new complaints.   Discharge Exam: Vitals:   04/19/21 0520 04/19/21 0746  BP: (!) 138/51 (!) 160/65  Pulse: 63 (!) 57  Resp: 18 18  Temp: (!) 97.4 F (36.3 C) 97.7 F (36.5 C)  SpO2: 97% 98%    Vitals:   04/18/21 2012 04/19/21 0042 04/19/21 0520 04/19/21 0746  BP: (!) 183/65 (!) 138/109 (!) 138/51 (!) 160/65  Pulse: 65 67 63 (!) 57  Resp: 18 18 18 18   Temp: 98.2 F (36.8 C) 97.9 F (36.6 C) (!) 97.4 F (36.3 C) 97.7 F (36.5 C)  TempSrc: Oral Oral Oral   SpO2: 97% 98% 97% 98%  Weight:      Height:        General: Pt is alert, awake, not in acute distress Cardiovascular: RRR, S1/S2 +, no rubs, no gallops Respiratory: CTA bilaterally, no wheezing, no rhonchi Abdominal: Soft, NT, ND, bowel sounds + Extremities: + edema, no cyanosis    The results of significant diagnostics from this hospitalization (including imaging, microbiology, ancillary and laboratory) are listed below for reference.     Microbiology: Recent Results (from the past 240 hour(s))  Resp Panel by RT-PCR (Flu A&B, Covid) Nasopharyngeal Swab     Status: None   Collection Time: 04/17/21  4:00  PM   Specimen: Nasopharyngeal Swab; Nasopharyngeal(NP) swabs in vial transport medium  Result Value Ref Range Status   SARS Coronavirus 2 by RT PCR NEGATIVE NEGATIVE Final    Comment: (NOTE) SARS-CoV-2 target nucleic acids are NOT DETECTED.  The SARS-CoV-2 RNA is generally detectable in upper respiratory specimens during the acute phase of infection. The lowest concentration of SARS-CoV-2 viral copies this assay can detect is 138 copies/mL. A negative result does not preclude SARS-Cov-2 infection and should not be used as the sole basis for treatment or other patient management decisions. A negative result may occur with  improper specimen collection/handling, submission of specimen other than nasopharyngeal swab, presence of viral mutation(s) within the areas targeted by this assay, and inadequate number of viral copies(<138 copies/mL). A negative result must be combined with clinical observations, patient history, and epidemiological information. The expected result is Negative.  Fact Sheet for  Patients:  EntrepreneurPulse.com.au  Fact Sheet for Healthcare Providers:  IncredibleEmployment.be  This test is no t yet approved or cleared by the [place] FDA and  has been authorized for detection and/or diagnosis of SARS-CoV-2 by FDA under an Emergency Use Authorization (EUA). This EUA will remain  in effect (meaning this test can be used) for the duration of the COVID-19 declaration under Section 564(b)(1) of the Act, 21 U.S.C.section 360bbb-3(b)(1), unless the authorization is terminated  or revoked sooner.       Influenza A by PCR NEGATIVE NEGATIVE Final   Influenza B by PCR NEGATIVE NEGATIVE Final    Comment: (NOTE) The Xpert Xpress SARS-CoV-2/FLU/RSV plus assay is intended as an aid in the diagnosis of influenza from Nasopharyngeal swab specimens and should not be used as a sole basis for treatment. Nasal washings and aspirates are unacceptable for Xpert Xpress SARS-CoV-2/FLU/RSV testing.  Fact Sheet for Patients: EntrepreneurPulse.com.au  Fact Sheet for Healthcare Providers: IncredibleEmployment.be  This test is not yet approved or cleared by the [place] FDA and has been authorized for detection and/or diagnosis of SARS-CoV-2 by FDA under an Emergency Use Authorization (EUA). This EUA will remain in effect (meaning this test can be used) for the duration of the COVID-19 declaration under Section 564(b)(1) of the Act, 21 U.S.C. section 360bbb-3(b)(1), unless the authorization is terminated or revoked.  Performed at [facility], [address] [place], [place] [address]      Labs: BNP (last 3 results) No results for input(s): BNP in the last 8760 hours. Basic Metabolic Panel: Recent Labs  Lab 04/17/21 1246 04/18/21 0629  NA 137 138  K 3.6 4.0  CL 102 105  CO2 28 26  GLUCOSE 123* 137*  BUN 17 20  CREATININE 0.99 1.05*  CALCIUM 8.6* 8.7*   Liver Function  Tests: Recent Labs  Lab 04/17/21 1246  AST 35  ALT 36  ALKPHOS 119  BILITOT 0.8  PROT 6.2*  ALBUMIN 3.0*   Recent Labs  Lab 04/17/21 1246  LIPASE 28   No results for input(s): AMMONIA in the last 168 hours. CBC: Recent Labs  Lab 04/17/21 1246 04/18/21 0629  WBC 10.0 7.1  HGB 10.1* 10.6*  HCT 31.6* 33.0*  MCV 84.0 82.3  PLT 150 139*   Cardiac Enzymes: No results for input(s): CKTOTAL, CKMB, CKMBINDEX, TROPONINI in the last 168 hours. BNP: Invalid input(s): POCBNP CBG: Recent Labs  Lab 04/18/21 0805 04/18/21 1139 04/18/21 1618 04/18/21 2012 04/19/21 0815  GLUCAP 116* 152* 148* 136* 132*   D-Dimer No results for input(s): DDIMER in the last  72 hours. Hgb A1c Recent Labs    04/17/21 1837  HGBA1C 6.0*   Lipid Profile No results for input(s): CHOL, HDL, LDLCALC, TRIG, CHOLHDL, LDLDIRECT in the last 72 hours. Thyroid function studies Recent Labs    04/17/21 1837  TSH 1.520   Anemia work up Recent Labs    04/17/21 1837  VITAMINB12 2,705*   Urinalysis    Component Value Date/Time   COLORURINE AMBER (A) 04/17/2021 1246   APPEARANCEUR TURBID (A) 04/17/2021 1246   LABSPEC 1.025 04/17/2021 1246   PHURINE 5.0 04/17/2021 1246   [place] 04/17/2021 1246   [place] 04/17/2021 1246   [place] 04/17/2021 1246   [place] 04/17/2021 1246   PROTEINUR 30 (A) 04/17/2021 1246   NITRITE NEGATIVE 04/17/2021 1246   LEUKOCYTESUR SMALL (A) 04/17/2021 1246   Sepsis Labs Invalid input(s): PROCALCITONIN,  WBC,  LACTICIDVEN Microbiology Recent Results (from the past 240 hour(s))  Resp Panel by RT-PCR (Flu A&B, Covid) Nasopharyngeal Swab     Status: None   Collection Time: 04/17/21  4:00 PM   Specimen: Nasopharyngeal Swab; Nasopharyngeal(NP) swabs in vial transport medium  Result Value Ref Range Status   SARS Coronavirus 2 by RT PCR NEGATIVE NEGATIVE Final    Comment: (NOTE) SARS-CoV-2 target nucleic acids are NOT  DETECTED.  The SARS-CoV-2 RNA is generally detectable in upper respiratory specimens during the acute phase of infection. The lowest concentration of SARS-CoV-2 viral copies this assay can detect is 138 copies/mL. A negative result does not preclude SARS-Cov-2 infection and should not be used as the sole basis for treatment or other patient management decisions. A negative result may occur with  improper specimen collection/handling, submission of specimen other than nasopharyngeal swab, presence of viral mutation(s) within the areas targeted by this assay, and inadequate number of viral copies(<138 copies/mL). A negative result must be combined with clinical observations, patient history, and epidemiological information. The expected result is Negative.  Fact Sheet for Patients:  EntrepreneurPulse.com.au  Fact Sheet for Healthcare Providers:  IncredibleEmployment.be  This test is no t yet approved or cleared by the [place] FDA and  has been authorized for detection and/or diagnosis of SARS-CoV-2 by FDA under an Emergency Use Authorization (EUA). This EUA will remain  in effect (meaning this test can be used) for the duration of the COVID-19 declaration under Section 564(b)(1) of the Act, 21 U.S.C.section 360bbb-3(b)(1), unless the authorization is terminated  or revoked sooner.       Influenza A by PCR NEGATIVE NEGATIVE Final   Influenza B by PCR NEGATIVE NEGATIVE Final    Comment: (NOTE) The Xpert Xpress SARS-CoV-2/FLU/RSV plus assay is intended as an aid in the diagnosis of influenza from Nasopharyngeal swab specimens and should not be used as a sole basis for treatment. Nasal washings and aspirates are unacceptable for Xpert Xpress SARS-CoV-2/FLU/RSV testing.  Fact Sheet for Patients: EntrepreneurPulse.com.au  Fact Sheet for Healthcare Providers: IncredibleEmployment.be  This test is not yet  approved or cleared by the [place] FDA and has been authorized for detection and/or diagnosis of SARS-CoV-2 by FDA under an Emergency Use Authorization (EUA). This EUA will remain in effect (meaning this test can be used) for the duration of the COVID-19 declaration under Section 564(b)(1) of the Act, 21 U.S.C. section 360bbb-3(b)(1), unless the authorization is terminated or revoked.  Performed at [facility], [address], [place], [place] [address]      Time coordinating discharge: 35 minutes.   SIGNED:   Jeoffrey Massed  Karleen Hampshire, MD  [facility] 04/19/2021, 11:36 AM

## 2021-04-19 NOTE — Telephone Encounter (Signed)
Copied from [place] [id number]. Topic: General - Call Back - No Documentation >> Apr 19, 2021  1:24 PM Erick Blinks wrote: Best contact: [employer] has sent over [facility] paperwork. [name] wants to make sure she gets it.

## 2021-04-19 NOTE — Progress Notes (Signed)
Patient is being discharged home with daughter. Follow-up outpat with Radiation Oncology. IV has been removed, Discharge instructions educated to patient. Copy given to daughter and patient. Daughter will be transportation home

## 2021-04-20 ENCOUNTER — Other Ambulatory Visit: Payer: Self-pay | Admitting: Family Medicine

## 2021-04-20 DIAGNOSIS — I209 Angina pectoris, unspecified: Secondary | ICD-10-CM

## 2021-04-20 NOTE — Telephone Encounter (Signed)
  Notes to clinic:  Requested script is expired  Review for continued use and refill    Requested Prescriptions  Pending Prescriptions Disp Refills   nitroGLYCERIN (NITROSTAT) 0.4 MG SL tablet 30 tablet 3    Sig: Place 1 tablet (0.4 mg total) under the tongue every 5 (five) minutes as needed for chest pain.      Cardiovascular:  Nitrates Failed - 04/20/2021  1:22 PM      Failed - Last BP in normal range    BP Readings from Last 1 Encounters:  04/19/21 (!) 160/65          Passed - Last Heart Rate in normal range    Pulse Readings from Last 1 Encounters:  04/19/21 (!) 57          Passed - Valid encounter within last 12 months    Recent Outpatient Visits           1 week ago Liver mass   [facility] Jerrol Banana., MD   4 months ago Ischemic cardiomyopathy   [facility] Jerrol Banana., MD   7 months ago Need for immunization against influenza   [facility] Jerrol Banana., MD   10 months ago Medicare annual wellness visit, subsequent   [facility] Jerrol Banana., MD   1 year ago Essential hypertension   [facility] Jerrol Banana., MD       Future Appointments             In 6 days Borders, Kirt Boys, NP [facility]   In 1 month Jerrol Banana., MD [facility], [place]

## 2021-04-20 NOTE — Telephone Encounter (Signed)
   Notes to clinic:  script requested is expired Review for continued use and refill    Requested Prescriptions  Pending Prescriptions Disp Refills   butalbital-acetaminophen-caffeine (FIORICET) 50-325-40 MG tablet [Pharmacy Med Name: BUTALBITAL-APAP-CAFFEINE 50-325-40] 30 tablet     Sig: TAKE ONE TABLET BY MOUTH EVERY 6 HOURS AS NEEDED FOR HEADACHE      Not Delegated - Analgesics:  Non-Opioid Analgesic Combinations Failed - 04/20/2021  8:39 AM      Failed - This refill cannot be delegated      Passed - Valid encounter within last 12 months    Recent Outpatient Visits           1 week ago Liver mass   [facility] Jerrol Banana., MD   4 months ago Ischemic cardiomyopathy   [facility] Jerrol Banana., MD   7 months ago Need for immunization against influenza   [facility] Jerrol Banana., MD   10 months ago Medicare annual wellness visit, subsequent   [facility] Jerrol Banana., MD   1 year ago Essential hypertension   [facility] Jerrol Banana., MD       Future Appointments             In 6 days Borders, Kirt Boys, NP [facility]   In 1 month Jerrol Banana., MD [facility] [place]

## 2021-04-20 NOTE — Telephone Encounter (Signed)
Copied from [place] [id number]. Topic: Quick Communication - Rx Refill/Question >> Apr 20, 2021  1:17 PM Yvette Rack wrote: Medication: nitroGLYCERIN (NITROSTAT) 0.4 MG SL tablet   Has the patient contacted their pharmacy? No. (Agent: If no, request that the patient contact the pharmacy for the refill.) (Agent: If yes, when and what did the pharmacy advise?)  Preferred Pharmacy (with phone number or street name): [place], [place] - [place]  Phone: [phone]   Fax: [phone]  Agent: Please be advised that RX refills may take up to 3 business days. We ask that you follow-up with your pharmacy.

## 2021-04-21 ENCOUNTER — Ambulatory Visit
Admission: RE | Admit: 2021-04-21 | Discharge: 2021-04-21 | Disposition: A | Discharge status: Home / Self Care | Payer: Medicare Other | Source: Ambulatory Visit | Attending: Radiation Oncology | Admitting: Radiation Oncology

## 2021-04-21 DIAGNOSIS — Z510 Encounter for antineoplastic radiation therapy: Secondary | ICD-10-CM | POA: Diagnosis not present

## 2021-04-21 DIAGNOSIS — C7931 Secondary malignant neoplasm of brain: Secondary | ICD-10-CM | POA: Insufficient documentation

## 2021-04-21 DIAGNOSIS — C252 Malignant neoplasm of tail of pancreas: Secondary | ICD-10-CM | POA: Insufficient documentation

## 2021-04-21 DIAGNOSIS — C787 Secondary malignant neoplasm of liver and intrahepatic bile duct: Secondary | ICD-10-CM | POA: Diagnosis not present

## 2021-04-21 LAB — CANCER ANTIGEN 19-9: CA 19-9: 24864 U/mL — ABNORMAL HIGH (ref 0–35)

## 2021-04-21 MED ORDER — NITROGLYCERIN 0.4 MG SL SUBL: 0.4000 mg | SUBLINGUAL_TABLET | SUBLINGUAL | 3 refills | Status: AC | PRN

## 2021-04-22 DIAGNOSIS — C787 Secondary malignant neoplasm of liver and intrahepatic bile duct: Secondary | ICD-10-CM | POA: Diagnosis not present

## 2021-04-22 DIAGNOSIS — C252 Malignant neoplasm of tail of pancreas: Secondary | ICD-10-CM | POA: Diagnosis not present

## 2021-04-22 DIAGNOSIS — Z510 Encounter for antineoplastic radiation therapy: Secondary | ICD-10-CM | POA: Diagnosis not present

## 2021-04-22 DIAGNOSIS — C7931 Secondary malignant neoplasm of brain: Secondary | ICD-10-CM | POA: Diagnosis not present

## 2021-04-26 ENCOUNTER — Ambulatory Visit: Admission: RE | Admit: 2021-04-26 | Payer: Medicare Other | Source: Ambulatory Visit

## 2021-04-26 ENCOUNTER — Ambulatory Visit: Payer: Self-pay | Admitting: Family Medicine

## 2021-04-26 ENCOUNTER — Other Ambulatory Visit: Payer: Self-pay | Admitting: *Deleted

## 2021-04-26 ENCOUNTER — Telehealth: Payer: Self-pay | Admitting: *Deleted

## 2021-04-26 ENCOUNTER — Inpatient Hospital Stay (HOSPITAL_BASED_OUTPATIENT_CLINIC_OR_DEPARTMENT_OTHER): Payer: Medicare Other | Admitting: Hospice and Palliative Medicine

## 2021-04-26 DIAGNOSIS — R531 Weakness: Secondary | ICD-10-CM | POA: Diagnosis not present

## 2021-04-26 DIAGNOSIS — Z510 Encounter for antineoplastic radiation therapy: Secondary | ICD-10-CM | POA: Diagnosis not present

## 2021-04-26 DIAGNOSIS — C252 Malignant neoplasm of tail of pancreas: Secondary | ICD-10-CM | POA: Diagnosis not present

## 2021-04-26 DIAGNOSIS — C7931 Secondary malignant neoplasm of brain: Secondary | ICD-10-CM | POA: Diagnosis not present

## 2021-04-26 DIAGNOSIS — C787 Secondary malignant neoplasm of liver and intrahepatic bile duct: Secondary | ICD-10-CM | POA: Diagnosis not present

## 2021-04-26 NOTE — Unmapped (Signed)
Shery from [facility] at [facility] called, to get authorization from Dr. Andrey Farmer so pt could be off Plavix for 5 days for liver biopsy.   Will relay.

## 2021-04-26 NOTE — Progress Notes (Signed)
Virtual Visit via Telephone Note  I connected with Anna Marsh on 04/26/21 at 11:30 AM EDT by telephone and verified that I am speaking with the correct person using two identifiers.  Location: Patient: Home Provider: Clinic   I discussed the limitations, risks, security and privacy concerns of performing an evaluation and management service by telephone and the availability of in person appointments. I also discussed with the patient that there may be a patient responsible charge related to this service. The patient expressed understanding and agreed to proceed.   History of Present Illness: Anna Marsh is a 75 y.o. female with multiple medical problems including hypertension, hyperlipidemia, GERD, history of TIAs, recent PE, who was found to have presumed pancreatic cancer with liver metastasis on recent CT.  Work-up is ongoing.  She was hospitalized 04/17/2021 to 719/22 with altered mental status.  CT of the head revealed focal hypodensities/edema in the right frontal lobe and left cerebellum concerning for metastatic disease.  She was started on steroids with plan for whole brain radiation.  Palliative care was consulted to address goals.   Observations/Objective: I called and spoke with patient by phone.  Plan is for her to start XRT later today and she will receive a total of 2 weeks of treatment.  Plan is then for probable future chemotherapy.  Patient reports that she is doing reasonably well since discharging home from the hospital.  She denies any symptomatic complaints today.  She does endorse generalized weakness but denies falls.  She does have family/friend staying with her and reports good support.  Assessment and Plan: Pancreatic cancer with brain mets -pending XRT  Weakness -Will refer to [facility] for rehab screening. Home health PT may be advantageous. Community palliative care referral  Follow Up Instructions: Follow-up MyChart visit 2 weeks   I discussed the  assessment and treatment plan with the patient. The patient was provided an opportunity to ask questions and all were answered. The patient agreed with the plan and demonstrated an understanding of the instructions.   The patient was advised to call back or seek an in-person evaluation if the symptoms worsen or if the condition fails to improve as anticipated.  I provided 10 minutes of non-face-to-face time during this encounter.   Irean Hong, NP

## 2021-04-26 NOTE — Telephone Encounter (Signed)
Yesterday I called Dr. Denman George office and got the number in care everywhere and left message to get  approval to hold plavix for 5 days for a liver biopsy. I got another number for the office today [phone]. I spoke to Butch Penny and she sent me to nurse line and left message that pt needs liver bx. She is on plavix and at first we thought that it was dr Ubaldo Glassing but was told on Monday that it is Dr. Denman George. Pt. Has brain mets and per scans she has has pancreas mass and liver lesions and needs a letter to be faxed for clearance to [phone]. Asked to get a call if questions or can't approve the plavix to stop

## 2021-04-27 ENCOUNTER — Ambulatory Visit
Admission: RE | Admit: 2021-04-27 | Discharge: 2021-04-27 | Disposition: A | Discharge status: Home / Self Care | Payer: Medicare Other | Source: Ambulatory Visit | Attending: Radiation Oncology | Admitting: Radiation Oncology

## 2021-04-27 ENCOUNTER — Ambulatory Visit: Payer: Medicare Other | Admitting: Family Medicine

## 2021-04-27 DIAGNOSIS — Z510 Encounter for antineoplastic radiation therapy: Secondary | ICD-10-CM | POA: Diagnosis not present

## 2021-04-27 DIAGNOSIS — C787 Secondary malignant neoplasm of liver and intrahepatic bile duct: Secondary | ICD-10-CM | POA: Diagnosis not present

## 2021-04-27 DIAGNOSIS — C252 Malignant neoplasm of tail of pancreas: Secondary | ICD-10-CM | POA: Diagnosis not present

## 2021-04-27 DIAGNOSIS — C7931 Secondary malignant neoplasm of brain: Secondary | ICD-10-CM | POA: Diagnosis not present

## 2021-04-28 ENCOUNTER — Ambulatory Visit
Admission: RE | Admit: 2021-04-28 | Discharge: 2021-04-28 | Disposition: A | Discharge status: Home / Self Care | Payer: Medicare Other | Source: Ambulatory Visit | Attending: Radiation Oncology | Admitting: Radiation Oncology

## 2021-04-28 DIAGNOSIS — C787 Secondary malignant neoplasm of liver and intrahepatic bile duct: Secondary | ICD-10-CM | POA: Diagnosis not present

## 2021-04-28 DIAGNOSIS — C252 Malignant neoplasm of tail of pancreas: Secondary | ICD-10-CM | POA: Diagnosis not present

## 2021-04-28 DIAGNOSIS — C7931 Secondary malignant neoplasm of brain: Secondary | ICD-10-CM | POA: Diagnosis not present

## 2021-04-28 DIAGNOSIS — Z510 Encounter for antineoplastic radiation therapy: Secondary | ICD-10-CM | POA: Diagnosis not present

## 2021-04-29 ENCOUNTER — Ambulatory Visit
Admission: RE | Admit: 2021-04-29 | Discharge: 2021-04-29 | Disposition: A | Discharge status: Home / Self Care | Payer: Medicare Other | Source: Ambulatory Visit | Attending: Radiation Oncology | Admitting: Radiation Oncology

## 2021-04-29 DIAGNOSIS — C787 Secondary malignant neoplasm of liver and intrahepatic bile duct: Secondary | ICD-10-CM | POA: Diagnosis not present

## 2021-04-29 DIAGNOSIS — C7931 Secondary malignant neoplasm of brain: Secondary | ICD-10-CM | POA: Diagnosis not present

## 2021-04-29 DIAGNOSIS — C252 Malignant neoplasm of tail of pancreas: Secondary | ICD-10-CM | POA: Diagnosis not present

## 2021-04-29 DIAGNOSIS — Z510 Encounter for antineoplastic radiation therapy: Secondary | ICD-10-CM | POA: Diagnosis not present

## 2021-05-02 ENCOUNTER — Telehealth: Payer: Self-pay | Admitting: Licensed Clinical Social Worker

## 2021-05-02 ENCOUNTER — Ambulatory Visit
Admission: RE | Admit: 2021-05-02 | Discharge: 2021-05-02 | Disposition: A | Discharge status: Home / Self Care | Payer: Medicare Other | Source: Ambulatory Visit | Attending: Radiation Oncology | Admitting: Radiation Oncology

## 2021-05-02 ENCOUNTER — Other Ambulatory Visit: Payer: Self-pay | Admitting: *Deleted

## 2021-05-02 ENCOUNTER — Telehealth: Payer: Self-pay | Admitting: *Deleted

## 2021-05-02 DIAGNOSIS — Z95810 Presence of automatic (implantable) cardiac defibrillator: Secondary | ICD-10-CM | POA: Diagnosis not present

## 2021-05-02 DIAGNOSIS — Z20822 Contact with and (suspected) exposure to covid-19: Secondary | ICD-10-CM | POA: Diagnosis not present

## 2021-05-02 DIAGNOSIS — I5022 Chronic systolic (congestive) heart failure: Secondary | ICD-10-CM | POA: Diagnosis not present

## 2021-05-02 DIAGNOSIS — C252 Malignant neoplasm of tail of pancreas: Secondary | ICD-10-CM | POA: Insufficient documentation

## 2021-05-02 DIAGNOSIS — Z8249 Family history of ischemic heart disease and other diseases of the circulatory system: Secondary | ICD-10-CM | POA: Diagnosis not present

## 2021-05-02 DIAGNOSIS — T380X5A Adverse effect of glucocorticoids and synthetic analogues, initial encounter: Secondary | ICD-10-CM | POA: Diagnosis not present

## 2021-05-02 DIAGNOSIS — C787 Secondary malignant neoplasm of liver and intrahepatic bile duct: Secondary | ICD-10-CM | POA: Diagnosis not present

## 2021-05-02 DIAGNOSIS — Z510 Encounter for antineoplastic radiation therapy: Secondary | ICD-10-CM | POA: Insufficient documentation

## 2021-05-02 DIAGNOSIS — D72828 Other elevated white blood cell count: Secondary | ICD-10-CM | POA: Diagnosis not present

## 2021-05-02 DIAGNOSIS — T451X5A Adverse effect of antineoplastic and immunosuppressive drugs, initial encounter: Secondary | ICD-10-CM | POA: Diagnosis not present

## 2021-05-02 DIAGNOSIS — K869 Disease of pancreas, unspecified: Secondary | ICD-10-CM | POA: Diagnosis not present

## 2021-05-02 DIAGNOSIS — M7989 Other specified soft tissue disorders: Secondary | ICD-10-CM | POA: Diagnosis not present

## 2021-05-02 DIAGNOSIS — D696 Thrombocytopenia, unspecified: Secondary | ICD-10-CM | POA: Diagnosis not present

## 2021-05-02 DIAGNOSIS — I110 Hypertensive heart disease with heart failure: Secondary | ICD-10-CM | POA: Diagnosis not present

## 2021-05-02 DIAGNOSIS — D6959 Other secondary thrombocytopenia: Secondary | ICD-10-CM | POA: Diagnosis not present

## 2021-05-02 DIAGNOSIS — Z800 Family history of malignant neoplasm of digestive organs: Secondary | ICD-10-CM | POA: Diagnosis not present

## 2021-05-02 DIAGNOSIS — Z4502 Encounter for adjustment and management of automatic implantable cardiac defibrillator: Secondary | ICD-10-CM | POA: Diagnosis not present

## 2021-05-02 DIAGNOSIS — Z923 Personal history of irradiation: Secondary | ICD-10-CM | POA: Diagnosis not present

## 2021-05-02 DIAGNOSIS — Z8673 Personal history of transient ischemic attack (TIA), and cerebral infarction without residual deficits: Secondary | ICD-10-CM | POA: Diagnosis not present

## 2021-05-02 DIAGNOSIS — I2699 Other pulmonary embolism without acute cor pulmonale: Secondary | ICD-10-CM | POA: Diagnosis not present

## 2021-05-02 DIAGNOSIS — Z7901 Long term (current) use of anticoagulants: Secondary | ICD-10-CM | POA: Diagnosis not present

## 2021-05-02 DIAGNOSIS — R7303 Prediabetes: Secondary | ICD-10-CM | POA: Diagnosis not present

## 2021-05-02 DIAGNOSIS — I82409 Acute embolism and thrombosis of unspecified deep veins of unspecified lower extremity: Secondary | ICD-10-CM | POA: Diagnosis not present

## 2021-05-02 DIAGNOSIS — Z87891 Personal history of nicotine dependence: Secondary | ICD-10-CM | POA: Diagnosis not present

## 2021-05-02 DIAGNOSIS — Z7902 Long term (current) use of antithrombotics/antiplatelets: Secondary | ICD-10-CM | POA: Diagnosis not present

## 2021-05-02 DIAGNOSIS — I82442 Acute embolism and thrombosis of left tibial vein: Secondary | ICD-10-CM | POA: Diagnosis not present

## 2021-05-02 DIAGNOSIS — I82402 Acute embolism and thrombosis of unspecified deep veins of left lower extremity: Secondary | ICD-10-CM | POA: Diagnosis not present

## 2021-05-02 DIAGNOSIS — C7931 Secondary malignant neoplasm of brain: Secondary | ICD-10-CM | POA: Diagnosis not present

## 2021-05-02 DIAGNOSIS — E7800 Pure hypercholesterolemia, unspecified: Secondary | ICD-10-CM | POA: Diagnosis not present

## 2021-05-02 DIAGNOSIS — B379 Candidiasis, unspecified: Secondary | ICD-10-CM | POA: Diagnosis not present

## 2021-05-02 DIAGNOSIS — I10 Essential (primary) hypertension: Secondary | ICD-10-CM | POA: Diagnosis not present

## 2021-05-02 DIAGNOSIS — Z86711 Personal history of pulmonary embolism: Secondary | ICD-10-CM | POA: Diagnosis not present

## 2021-05-02 DIAGNOSIS — C259 Malignant neoplasm of pancreas, unspecified: Secondary | ICD-10-CM | POA: Diagnosis not present

## 2021-05-02 MED ORDER — CARVEDILOL 6.25 MG TABLET
ORAL_TABLET | 3 refills | 0.00000 days
Start: 2021-05-02 — End: ?

## 2021-05-02 MED ORDER — ENOXAPARIN SODIUM 100 MG/ML IJ SOSY: 100.0000 mg | PREFILLED_SYRINGE | Freq: Two times a day (BID) | INTRAMUSCULAR | 0 refills | Status: DC

## 2021-05-02 NOTE — Unmapped (Signed)
Specialty Medication(s): Repatha 140 mg/mL    Cynthia Garrison has been dis-enrolled from the [facility] specialty pharmacy services due to medication discontinuation resulting from side effect intolerance.    Additional information provided to the patient: Patient has [facility] contact information. I encouraged her to call with any further needs or concerns.     Camillo Flaming  [facility] Specialty Pharmacist

## 2021-05-02 NOTE — Telephone Encounter (Signed)
Please see how she is doing. Smc tomorrow if needed

## 2021-05-02 NOTE — Progress Notes (Signed)
Patient on schedule for Liver biopsy 05/10/2021, spoke with Michelle/patient' daughter with instructions given. Made aware to hold plavix/ with LD 8/3, hold Eliquis with LD 8/6, and oncology will contact daughter of when to start Lovenox bridge, holding am dose day of procedure. Made aware to be here @ 0830,, npo after mn prior to procedure and driver post procedure/recovery/discharge. Stated understanding.

## 2021-05-02 NOTE — Telephone Encounter (Signed)
I called and spoke to [place] her daughter. I was going over the info about biopsy date and the dates to stop 8/4 last plavix and then stop last eliquis is 8/6. She will have lovenox sq and [name] has given it to her before and knows how to do it. She will give Lovenox 100 mg to her 8/7 and 8/8 6 am and 6 pm for those dates. After the biopsy on 8/9 that evening she will go back to taking plavix and eliquis. I checked about the patient and she feels weak, she drank 2 bottles of water yesterday and ate but started having diarrhea. I told [name] that she can drink water, but try gatorade, smart water with electrolytes, boost or ensure, pedialite. For diarrhea try to use imodium and gave her instructions. [name] said she will call me tomorrow if she needs fluids and felt to make sure she drinks more and if diarrhea continues but nothing today

## 2021-05-02 NOTE — Telephone Encounter (Signed)
Patients daughter called stating that she is having diarrhea and loose stools 3 to 4 times a day for about 1  week. I suggested trying immodium to see if would help. Daughter was afraid to give her anything due to all the medication that she is currently taking. Daughter stated that the patient is also having weakness, and feeling fatigued. PAtient is currently receiving radiation treatments to her brain, which could be causing fatigue and weakness. I will send to Dr. Janese Banks team to see if she will need symptom management.

## 2021-05-03 ENCOUNTER — Ambulatory Visit
Admission: RE | Admit: 2021-05-03 | Discharge: 2021-05-03 | Disposition: A | Discharge status: Home / Self Care | Payer: Medicare Other | Source: Ambulatory Visit | Attending: Radiation Oncology | Admitting: Radiation Oncology

## 2021-05-03 ENCOUNTER — Other Ambulatory Visit: Payer: Self-pay | Admitting: *Deleted

## 2021-05-03 ENCOUNTER — Encounter: Admit: 2021-05-03 | Discharge: 2021-05-04 | Payer: MEDICARE

## 2021-05-03 DIAGNOSIS — Z4502 Encounter for adjustment and management of automatic implantable cardiac defibrillator: Principal | ICD-10-CM

## 2021-05-03 DIAGNOSIS — C7931 Secondary malignant neoplasm of brain: Secondary | ICD-10-CM | POA: Diagnosis not present

## 2021-05-03 MED ORDER — FLUCONAZOLE 100 MG PO TABS: 100.0000 mg | ORAL_TABLET | Freq: Every day | ORAL | 0 refills | Status: DC

## 2021-05-04 ENCOUNTER — Telehealth: Payer: Self-pay | Admitting: Oncology

## 2021-05-04 ENCOUNTER — Ambulatory Visit
Admission: RE | Admit: 2021-05-04 | Discharge: 2021-05-04 | Disposition: A | Discharge status: Home / Self Care | Payer: Medicare Other | Source: Ambulatory Visit | Attending: Radiation Oncology | Admitting: Radiation Oncology

## 2021-05-04 ENCOUNTER — Other Ambulatory Visit: Payer: Self-pay

## 2021-05-04 ENCOUNTER — Inpatient Hospital Stay
Admission: EM | Admit: 2021-05-04 | Discharge: 2021-05-06 | DRG: 300 | Disposition: A | Discharge status: Home Health | Payer: Medicare Other | Attending: Hospitalist | Admitting: Hospitalist

## 2021-05-04 ENCOUNTER — Emergency Department: Payer: Medicare Other

## 2021-05-04 DIAGNOSIS — E7800 Pure hypercholesterolemia, unspecified: Secondary | ICD-10-CM | POA: Diagnosis present

## 2021-05-04 DIAGNOSIS — R7303 Prediabetes: Secondary | ICD-10-CM | POA: Diagnosis present

## 2021-05-04 DIAGNOSIS — Z923 Personal history of irradiation: Secondary | ICD-10-CM

## 2021-05-04 DIAGNOSIS — C259 Malignant neoplasm of pancreas, unspecified: Secondary | ICD-10-CM | POA: Diagnosis present

## 2021-05-04 DIAGNOSIS — D696 Thrombocytopenia, unspecified: Secondary | ICD-10-CM

## 2021-05-04 DIAGNOSIS — K219 Gastro-esophageal reflux disease without esophagitis: Secondary | ICD-10-CM | POA: Diagnosis present

## 2021-05-04 DIAGNOSIS — I10 Essential (primary) hypertension: Secondary | ICD-10-CM | POA: Diagnosis present

## 2021-05-04 DIAGNOSIS — Z7902 Long term (current) use of antithrombotics/antiplatelets: Secondary | ICD-10-CM

## 2021-05-04 DIAGNOSIS — I82409 Acute embolism and thrombosis of unspecified deep veins of unspecified lower extremity: Secondary | ICD-10-CM | POA: Diagnosis present

## 2021-05-04 DIAGNOSIS — D72828 Other elevated white blood cell count: Secondary | ICD-10-CM | POA: Diagnosis present

## 2021-05-04 DIAGNOSIS — D6959 Other secondary thrombocytopenia: Secondary | ICD-10-CM | POA: Diagnosis present

## 2021-05-04 DIAGNOSIS — I82442 Acute embolism and thrombosis of left tibial vein: Principal | ICD-10-CM | POA: Diagnosis present

## 2021-05-04 DIAGNOSIS — D72829 Elevated white blood cell count, unspecified: Secondary | ICD-10-CM

## 2021-05-04 DIAGNOSIS — I5022 Chronic systolic (congestive) heart failure: Secondary | ICD-10-CM | POA: Diagnosis present

## 2021-05-04 DIAGNOSIS — Z8673 Personal history of transient ischemic attack (TIA), and cerebral infarction without residual deficits: Secondary | ICD-10-CM

## 2021-05-04 DIAGNOSIS — C7931 Secondary malignant neoplasm of brain: Secondary | ICD-10-CM | POA: Diagnosis present

## 2021-05-04 DIAGNOSIS — Z79899 Other long term (current) drug therapy: Secondary | ICD-10-CM

## 2021-05-04 DIAGNOSIS — Z90710 Acquired absence of both cervix and uterus: Secondary | ICD-10-CM

## 2021-05-04 DIAGNOSIS — B379 Candidiasis, unspecified: Secondary | ICD-10-CM | POA: Diagnosis present

## 2021-05-04 DIAGNOSIS — Z20822 Contact with and (suspected) exposure to covid-19: Secondary | ICD-10-CM | POA: Diagnosis present

## 2021-05-04 DIAGNOSIS — Z95810 Presence of automatic (implantable) cardiac defibrillator: Secondary | ICD-10-CM

## 2021-05-04 DIAGNOSIS — I110 Hypertensive heart disease with heart failure: Secondary | ICD-10-CM | POA: Diagnosis present

## 2021-05-04 DIAGNOSIS — Z87891 Personal history of nicotine dependence: Secondary | ICD-10-CM

## 2021-05-04 DIAGNOSIS — T380X5A Adverse effect of glucocorticoids and synthetic analogues, initial encounter: Secondary | ICD-10-CM | POA: Diagnosis present

## 2021-05-04 DIAGNOSIS — T451X5A Adverse effect of antineoplastic and immunosuppressive drugs, initial encounter: Secondary | ICD-10-CM | POA: Diagnosis present

## 2021-05-04 DIAGNOSIS — C787 Secondary malignant neoplasm of liver and intrahepatic bile duct: Secondary | ICD-10-CM | POA: Diagnosis present

## 2021-05-04 DIAGNOSIS — Z7901 Long term (current) use of anticoagulants: Secondary | ICD-10-CM

## 2021-05-04 DIAGNOSIS — I480 Paroxysmal atrial fibrillation: Secondary | ICD-10-CM | POA: Diagnosis present

## 2021-05-04 DIAGNOSIS — Z800 Family history of malignant neoplasm of digestive organs: Secondary | ICD-10-CM

## 2021-05-04 DIAGNOSIS — Z8249 Family history of ischemic heart disease and other diseases of the circulatory system: Secondary | ICD-10-CM

## 2021-05-04 DIAGNOSIS — Z86711 Personal history of pulmonary embolism: Secondary | ICD-10-CM

## 2021-05-04 LAB — CBC WITH DIFFERENTIAL/PLATELET
Abs Immature Granulocytes: 0.39 10*3/uL — ABNORMAL HIGH (ref 0.00–0.07)
Basophils Absolute: 0 10*3/uL (ref 0.0–0.1)
Basophils Relative: 0 %
Eosinophils Absolute: 0 10*3/uL (ref 0.0–0.5)
Eosinophils Relative: 0 %
HCT: 36.3 % (ref 36.0–46.0)
Hemoglobin: 12.1 g/dL (ref 12.0–15.0)
Immature Granulocytes: 2 %
Lymphocytes Relative: 2 %
Lymphs Abs: 0.4 10*3/uL — ABNORMAL LOW (ref 0.7–4.0)
MCH: 27.8 pg (ref 26.0–34.0)
MCHC: 33.3 g/dL (ref 30.0–36.0)
MCV: 83.3 fL (ref 80.0–100.0)
Monocytes Absolute: 0.8 10*3/uL (ref 0.1–1.0)
Monocytes Relative: 4 %
Neutro Abs: 17.4 10*3/uL — ABNORMAL HIGH (ref 1.7–7.7)
Neutrophils Relative %: 92 %
Platelets: 44 10*3/uL — ABNORMAL LOW (ref 150–400)
RBC: 4.36 MIL/uL (ref 3.87–5.11)
RDW: 16.3 % — ABNORMAL HIGH (ref 11.5–15.5)
Smear Review: DECREASED
WBC: 19.1 10*3/uL — ABNORMAL HIGH (ref 4.0–10.5)
nRBC: 0 % (ref 0.0–0.2)

## 2021-05-04 LAB — BASIC METABOLIC PANEL
Anion gap: 7 (ref 5–15)
BUN: 43 mg/dL — ABNORMAL HIGH (ref 8–23)
CO2: 24 mmol/L (ref 22–32)
Calcium: 8.6 mg/dL — ABNORMAL LOW (ref 8.9–10.3)
Chloride: 104 mmol/L (ref 98–111)
Creatinine, Ser: 1.07 mg/dL — ABNORMAL HIGH (ref 0.44–1.00)
GFR, Estimated: 54 mL/min — ABNORMAL LOW (ref >60)
Glucose, Bld: 125 mg/dL — ABNORMAL HIGH (ref 70–99)
Potassium: 5.1 mmol/L (ref 3.5–5.1)
Sodium: 135 mmol/L (ref 135–145)

## 2021-05-04 LAB — PROTIME-INR
INR: 1.5 — ABNORMAL HIGH (ref 0.8–1.2)
Prothrombin Time: 18.5 seconds — ABNORMAL HIGH (ref 11.4–15.2)

## 2021-05-04 NOTE — Telephone Encounter (Signed)
Pt daughter called to request a hospital bed for her mother. She is scared for her to continue to go up the stairs to her bedroom and would like to know how they can go about getting her some assistance at home.

## 2021-05-04 NOTE — ED Triage Notes (Addendum)
Pt to ER via POV with complaints of left leg pain. Pt reports waking up with it more swollen and having difficulty ambulating. Pt recently has previously diagnosed DVT in left leg and has been taking eliquis and plavix as prescribed. Left foot swollen on assessment, 2+ pedal pulse. Unable to assess cap refill in triage due to nail polish.   Just finished today's session of radiation treatment for stage 4 pancreatic cancer and was advised to come to the ER.

## 2021-05-04 NOTE — ED Notes (Signed)
Rainbow was sent to the lab. 

## 2021-05-05 ENCOUNTER — Encounter
Admission: EM | Disposition: A | Discharge status: Home Health | Payer: Self-pay | Source: Home / Self Care | Attending: Hospitalist

## 2021-05-05 ENCOUNTER — Other Ambulatory Visit: Payer: Self-pay

## 2021-05-05 ENCOUNTER — Encounter: Payer: Self-pay | Admitting: Hospitalist

## 2021-05-05 ENCOUNTER — Other Ambulatory Visit (INDEPENDENT_AMBULATORY_CARE_PROVIDER_SITE_OTHER): Payer: Self-pay | Admitting: Vascular Surgery

## 2021-05-05 ENCOUNTER — Ambulatory Visit: Payer: Medicare Other

## 2021-05-05 DIAGNOSIS — D6959 Other secondary thrombocytopenia: Secondary | ICD-10-CM | POA: Diagnosis present

## 2021-05-05 DIAGNOSIS — Z87891 Personal history of nicotine dependence: Secondary | ICD-10-CM | POA: Diagnosis not present

## 2021-05-05 DIAGNOSIS — R7303 Prediabetes: Secondary | ICD-10-CM | POA: Diagnosis present

## 2021-05-05 DIAGNOSIS — Z8249 Family history of ischemic heart disease and other diseases of the circulatory system: Secondary | ICD-10-CM | POA: Diagnosis not present

## 2021-05-05 DIAGNOSIS — Z923 Personal history of irradiation: Secondary | ICD-10-CM | POA: Diagnosis not present

## 2021-05-05 DIAGNOSIS — B379 Candidiasis, unspecified: Secondary | ICD-10-CM | POA: Diagnosis present

## 2021-05-05 DIAGNOSIS — Z800 Family history of malignant neoplasm of digestive organs: Secondary | ICD-10-CM | POA: Diagnosis not present

## 2021-05-05 DIAGNOSIS — I82442 Acute embolism and thrombosis of left tibial vein: Secondary | ICD-10-CM | POA: Diagnosis not present

## 2021-05-05 DIAGNOSIS — I10 Essential (primary) hypertension: Secondary | ICD-10-CM | POA: Diagnosis not present

## 2021-05-05 DIAGNOSIS — E7800 Pure hypercholesterolemia, unspecified: Secondary | ICD-10-CM | POA: Diagnosis present

## 2021-05-05 DIAGNOSIS — D72829 Elevated white blood cell count, unspecified: Secondary | ICD-10-CM

## 2021-05-05 DIAGNOSIS — I2699 Other pulmonary embolism without acute cor pulmonale: Secondary | ICD-10-CM | POA: Diagnosis not present

## 2021-05-05 DIAGNOSIS — C7931 Secondary malignant neoplasm of brain: Secondary | ICD-10-CM | POA: Diagnosis not present

## 2021-05-05 DIAGNOSIS — M7989 Other specified soft tissue disorders: Secondary | ICD-10-CM | POA: Diagnosis present

## 2021-05-05 DIAGNOSIS — K869 Disease of pancreas, unspecified: Secondary | ICD-10-CM | POA: Diagnosis not present

## 2021-05-05 DIAGNOSIS — Z20822 Contact with and (suspected) exposure to covid-19: Secondary | ICD-10-CM | POA: Diagnosis present

## 2021-05-05 DIAGNOSIS — C259 Malignant neoplasm of pancreas, unspecified: Secondary | ICD-10-CM | POA: Diagnosis present

## 2021-05-05 DIAGNOSIS — Z515 Encounter for palliative care: Secondary | ICD-10-CM | POA: Diagnosis not present

## 2021-05-05 DIAGNOSIS — I82402 Acute embolism and thrombosis of unspecified deep veins of left lower extremity: Secondary | ICD-10-CM | POA: Diagnosis not present

## 2021-05-05 DIAGNOSIS — Z95810 Presence of automatic (implantable) cardiac defibrillator: Secondary | ICD-10-CM | POA: Diagnosis not present

## 2021-05-05 DIAGNOSIS — Z7901 Long term (current) use of anticoagulants: Secondary | ICD-10-CM | POA: Diagnosis not present

## 2021-05-05 DIAGNOSIS — T380X5A Adverse effect of glucocorticoids and synthetic analogues, initial encounter: Secondary | ICD-10-CM | POA: Diagnosis present

## 2021-05-05 DIAGNOSIS — D696 Thrombocytopenia, unspecified: Secondary | ICD-10-CM | POA: Diagnosis not present

## 2021-05-05 DIAGNOSIS — I82409 Acute embolism and thrombosis of unspecified deep veins of unspecified lower extremity: Secondary | ICD-10-CM | POA: Diagnosis not present

## 2021-05-05 DIAGNOSIS — Z7902 Long term (current) use of antithrombotics/antiplatelets: Secondary | ICD-10-CM | POA: Diagnosis not present

## 2021-05-05 DIAGNOSIS — C787 Secondary malignant neoplasm of liver and intrahepatic bile duct: Secondary | ICD-10-CM | POA: Diagnosis not present

## 2021-05-05 DIAGNOSIS — Z90710 Acquired absence of both cervix and uterus: Secondary | ICD-10-CM | POA: Diagnosis not present

## 2021-05-05 DIAGNOSIS — Z86711 Personal history of pulmonary embolism: Secondary | ICD-10-CM | POA: Diagnosis not present

## 2021-05-05 DIAGNOSIS — I110 Hypertensive heart disease with heart failure: Secondary | ICD-10-CM | POA: Diagnosis present

## 2021-05-05 DIAGNOSIS — I5022 Chronic systolic (congestive) heart failure: Secondary | ICD-10-CM | POA: Diagnosis present

## 2021-05-05 DIAGNOSIS — D72828 Other elevated white blood cell count: Secondary | ICD-10-CM | POA: Diagnosis present

## 2021-05-05 DIAGNOSIS — T451X5A Adverse effect of antineoplastic and immunosuppressive drugs, initial encounter: Secondary | ICD-10-CM | POA: Diagnosis present

## 2021-05-05 DIAGNOSIS — Z8673 Personal history of transient ischemic attack (TIA), and cerebral infarction without residual deficits: Secondary | ICD-10-CM | POA: Diagnosis not present

## 2021-05-05 HISTORY — PX: IVC FILTER INSERTION: CATH118245

## 2021-05-05 LAB — RESP PANEL BY RT-PCR (FLU A&B, COVID) ARPGX2
Influenza A by PCR: NEGATIVE
Influenza B by PCR: NEGATIVE
SARS Coronavirus 2 by RT PCR: NEGATIVE

## 2021-05-05 LAB — PROTIME-INR
INR: 1.5 — ABNORMAL HIGH (ref 0.8–1.2)
Prothrombin Time: 18.2 seconds — ABNORMAL HIGH (ref 11.4–15.2)

## 2021-05-05 LAB — HEPARIN LEVEL (UNFRACTIONATED): Heparin Unfractionated: >1.1 IU/mL — ABNORMAL HIGH (ref 0.30–0.70)

## 2021-05-05 LAB — APTT: aPTT: 24 seconds (ref 24–36)

## 2021-05-05 SURGERY — IVC FILTER INSERTION
Anesthesia: Moderate Sedation

## 2021-05-05 MED ORDER — IODIXANOL 320 MG/ML IV SOLN
INTRAVENOUS | Status: DC | PRN
  Administered 2021-05-05: 15 mL

## 2021-05-05 MED ORDER — ONDANSETRON HCL 4 MG/2ML IJ SOLN: 4.0000 mg | Freq: Four times a day (QID) | INTRAMUSCULAR | Status: DC | PRN

## 2021-05-05 MED ORDER — ATORVASTATIN CALCIUM 20 MG PO TABS
80.0000 mg | ORAL_TABLET | Freq: Every day | ORAL | Status: DC
  Administered 2021-05-05 – 2021-05-06 (×2): 80 mg via ORAL
  Filled 2021-05-05 (×2): qty 4

## 2021-05-05 MED ORDER — CARVEDILOL 6.25 MG PO TABS: 6.2500 mg | ORAL_TABLET | Freq: Two times a day (BID) | ORAL | Status: DC

## 2021-05-05 MED ORDER — CLINDAMYCIN PHOSPHATE 300 MG/50ML IV SOLN
INTRAVENOUS | Status: AC
  Administered 2021-05-05: 300 mg
  Filled 2021-05-05: qty 50

## 2021-05-05 MED ORDER — DIPHENHYDRAMINE HCL 50 MG/ML IJ SOLN
INTRAMUSCULAR | Status: AC
  Administered 2021-05-05: 50 mg
  Filled 2021-05-05: qty 1

## 2021-05-05 MED ORDER — DEXAMETHASONE 4 MG PO TABS
4.0000 mg | ORAL_TABLET | Freq: Three times a day (TID) | ORAL | Status: DC
  Administered 2021-05-05 – 2021-05-06 (×3): 4 mg via ORAL
  Filled 2021-05-05 (×7): qty 1

## 2021-05-05 MED ORDER — HEPARIN (PORCINE) 25000 UT/250ML-% IV SOLN
1400.0000 [IU]/h | INTRAVENOUS | Status: DC
  Administered 2021-05-05: 1400 [IU]/h via INTRAVENOUS
  Filled 2021-05-05: qty 250

## 2021-05-05 MED ORDER — MIDAZOLAM HCL 5 MG/5ML IJ SOLN
INTRAMUSCULAR | Status: AC
  Filled 2021-05-05: qty 5

## 2021-05-05 MED ORDER — PANTOPRAZOLE SODIUM 40 MG PO TBEC
40.0000 mg | DELAYED_RELEASE_TABLET | Freq: Every morning | ORAL | Status: DC
  Administered 2021-05-05 – 2021-05-06 (×2): 40 mg via ORAL
  Filled 2021-05-05: qty 1

## 2021-05-05 MED ORDER — ACETAMINOPHEN 650 MG RE SUPP: 650.0000 mg | Freq: Four times a day (QID) | RECTAL | Status: DC | PRN

## 2021-05-05 MED ORDER — FENTANYL CITRATE (PF) 100 MCG/2ML IJ SOLN
INTRAMUSCULAR | Status: DC | PRN
  Administered 2021-05-05: 50 ug via INTRAVENOUS

## 2021-05-05 MED ORDER — ZINC OXIDE 40 % EX OINT
TOPICAL_OINTMENT | CUTANEOUS | Status: DC | PRN
  Filled 2021-05-05 (×2): qty 113

## 2021-05-05 MED ORDER — GABAPENTIN 100 MG PO CAPS
200.0000 mg | ORAL_CAPSULE | Freq: Every day | ORAL | Status: DC
  Administered 2021-05-05: 200 mg via ORAL
  Filled 2021-05-05: qty 2

## 2021-05-05 MED ORDER — FENTANYL CITRATE (PF) 100 MCG/2ML IJ SOLN
INTRAMUSCULAR | Status: AC
  Filled 2021-05-05: qty 2

## 2021-05-05 MED ORDER — LIDOCAINE VISCOUS HCL 2 % MT SOLN
15.0000 mL | Freq: Once | OROMUCOSAL | Status: AC
  Administered 2021-05-05: 15 mL via OROMUCOSAL
  Filled 2021-05-05: qty 15

## 2021-05-05 MED ORDER — ENOXAPARIN SODIUM 120 MG/0.8ML IJ SOSY
1.0000 mg/kg | PREFILLED_SYRINGE | Freq: Two times a day (BID) | INTRAMUSCULAR | Status: DC
  Administered 2021-05-05 – 2021-05-06 (×3): 120 mg via SUBCUTANEOUS
  Filled 2021-05-05 (×4): qty 0.8

## 2021-05-05 MED ORDER — ACETAMINOPHEN 325 MG PO TABS: 650.0000 mg | ORAL_TABLET | Freq: Four times a day (QID) | ORAL | Status: DC | PRN

## 2021-05-05 MED ORDER — METHYLPREDNISOLONE SODIUM SUCC 125 MG IJ SOLR: 125.0000 mg | Freq: Once | INTRAMUSCULAR | Status: DC | PRN

## 2021-05-05 MED ORDER — METHYLPREDNISOLONE SODIUM SUCC 125 MG IJ SOLR
INTRAMUSCULAR | Status: AC
  Administered 2021-05-05: 125 mg
  Filled 2021-05-05: qty 2

## 2021-05-05 MED ORDER — HEPARIN BOLUS VIA INFUSION
5000.0000 [IU] | Freq: Once | INTRAVENOUS | Status: AC
  Administered 2021-05-05: 5000 [IU] via INTRAVENOUS
  Filled 2021-05-05: qty 5000

## 2021-05-05 MED ORDER — HEPARIN SODIUM (PORCINE) 1000 UNIT/ML IJ SOLN
INTRAMUSCULAR | Status: AC
  Filled 2021-05-05: qty 1

## 2021-05-05 MED ORDER — POTASSIUM CHLORIDE CRYS ER 20 MEQ PO TBCR
10.0000 meq | EXTENDED_RELEASE_TABLET | Freq: Once | ORAL | Status: AC
  Administered 2021-05-05: 10 meq via ORAL
  Filled 2021-05-05: qty 1

## 2021-05-05 MED ORDER — ONDANSETRON HCL 4 MG PO TABS: 4.0000 mg | ORAL_TABLET | Freq: Four times a day (QID) | ORAL | Status: DC | PRN

## 2021-05-05 MED ORDER — CLINDAMYCIN PHOSPHATE 900 MG/50ML IV SOLN
900.0000 mg | INTRAVENOUS | Status: DC
  Filled 2021-05-05: qty 50

## 2021-05-05 MED ORDER — FAMOTIDINE 20 MG PO TABS
ORAL_TABLET | ORAL | Status: AC
  Administered 2021-05-05: 40 mg
  Filled 2021-05-05: qty 2

## 2021-05-05 MED ORDER — DEXAMETHASONE 4 MG PO TABS
4.0000 mg | ORAL_TABLET | Freq: Once | ORAL | Status: AC
  Administered 2021-05-05: 4 mg via ORAL
  Filled 2021-05-05: qty 1

## 2021-05-05 MED ORDER — GABAPENTIN 100 MG PO CAPS
100.0000 mg | ORAL_CAPSULE | Freq: Once | ORAL | Status: AC
  Administered 2021-05-05: 100 mg via ORAL
  Filled 2021-05-05: qty 1

## 2021-05-05 MED ORDER — MIDAZOLAM HCL 2 MG/ML PO SYRP: 8.0000 mg | ORAL_SOLUTION | Freq: Once | ORAL | Status: DC | PRN

## 2021-05-05 MED ORDER — MIDAZOLAM HCL 2 MG/2ML IJ SOLN
INTRAMUSCULAR | Status: DC | PRN
  Administered 2021-05-05: 1 mg via INTRAVENOUS

## 2021-05-05 MED ORDER — HYDROMORPHONE HCL 1 MG/ML IJ SOLN: 1.0000 mg | Freq: Once | INTRAMUSCULAR | Status: DC | PRN

## 2021-05-05 MED ORDER — AMLODIPINE BESYLATE 5 MG PO TABS
5.0000 mg | ORAL_TABLET | Freq: Every day | ORAL | Status: DC
  Administered 2021-05-05 – 2021-05-06 (×2): 5 mg via ORAL
  Filled 2021-05-05 (×2): qty 1

## 2021-05-05 MED ORDER — CLINDAMYCIN PHOSPHATE 300 MG/50ML IV SOLN: 300.0000 mg | Freq: Once | INTRAVENOUS | Status: DC

## 2021-05-05 MED ORDER — SODIUM CHLORIDE 0.9 % IV SOLN
INTRAVENOUS | Status: DC
Start: 2021-05-05 — End: 2021-05-05

## 2021-05-05 MED ORDER — DIPHENHYDRAMINE HCL 50 MG/ML IJ SOLN: 50.0000 mg | Freq: Once | INTRAMUSCULAR | Status: DC | PRN

## 2021-05-05 MED ORDER — MORPHINE SULFATE (PF) 2 MG/ML IV SOLN: 2.0000 mg | INTRAVENOUS | Status: DC | PRN

## 2021-05-05 MED ORDER — HYDROCODONE-ACETAMINOPHEN 5-325 MG PO TABS: 1.0000 | ORAL_TABLET | ORAL | Status: DC | PRN

## 2021-05-05 MED ORDER — FLUCONAZOLE 100 MG PO TABS
100.0000 mg | ORAL_TABLET | Freq: Every day | ORAL | Status: DC
  Administered 2021-05-05 – 2021-05-06 (×2): 100 mg via ORAL
  Filled 2021-05-05 (×2): qty 1

## 2021-05-05 MED ORDER — CARVEDILOL 6.25 MG PO TABS
6.2500 mg | ORAL_TABLET | Freq: Once | ORAL | Status: AC
  Administered 2021-05-05: 6.25 mg via ORAL
  Filled 2021-05-05: qty 1

## 2021-05-05 MED ORDER — FAMOTIDINE 20 MG PO TABS: 40.0000 mg | ORAL_TABLET | Freq: Once | ORAL | Status: DC | PRN

## 2021-05-05 SURGICAL SUPPLY — 4 items
COVER PROBE U/S 5X48 (MISCELLANEOUS) ×2 IMPLANT
KIT FEMORAL DEL DENALI (Miscellaneous) ×2 IMPLANT
PACK ANGIOGRAPHY (CUSTOM PROCEDURE TRAY) ×2 IMPLANT
WIRE GUIDERIGHT .035X150 (WIRE) ×2 IMPLANT

## 2021-05-05 NOTE — Progress Notes (Addendum)
PROGRESS NOTE    Anna Marsh  [id number] DOB: 1946-08-03 DOA: 05/04/2021 PCP: Jerrol Banana., MD  [id number]   Assessment & Plan:   Active Problems:   Hypertension   Chronic systolic CHF (congestive heart failure), NYHA class 3 (HCC)   Paroxysmal atrial fibrillation (HCC)   Liver metastases (HCC)   History of CVA (cerebrovascular accident)   DVT (deep venous thrombosis) (HCC)   Leukocytosis   Anna Marsh is a 75 y.o. 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    She and daughter reported compliance with her anticoagulation and does not miss any doses.  She denies chest pain or shortness of breath.     DVT left lower extremity, with history of PE, on Eliquis --started on heparin infusion --discussed case with Dr. Janese Banks Plan: --IVC filter today --switch to lovenox treatment dose  Pancreatic cancer metastatic to liver and brain - cont dexametheasone  Leukocytosis --likely related to prednisone therapy for brain mets  Thrombocytopenia - Secondary to chemotherapy -Monitor for bleeding in view of Heparin infusion   Essential hypertension - Continue amlodipine --hold home coreg due to bradycardia     Chronic systolic CHF s/p AICD --hold home coreg due to bradycardia     History of CVA (cerebrovascular accident) - Continue atorvastatin  --Hold plavix for upcoming liver biopsy  Thrush --cont home fluconazole   DVT prophylaxis: Lovenox SQ Code Status: Full code  Family Communication: daughter updated at bedside today Level of care: Med-Surg Dispo:   The patient is from: home Anticipated d/c is to: home Anticipated d/c date is: tomorrow Patient currently is not  medically ready to d/c due to: monitor overnight after IVC filter.   Subjective and Interval History:  No leg pain, just swelling.  Complained of pain in her tongue from thrush.     Objective: Vitals:   05/05/21 1615 05/05/21 1630 05/05/21 1700 05/05/21 1927  BP: (!) 128/41 (!) 141/40 (!) 149/44 (!) 124/44  Pulse: (!) 55 (!) 54 (!) 51 62  Resp: '13 16 18 17  '$ Temp:    98.7 F (37.1 C)  TempSrc:    Oral  SpO2: 99% 98% 100% 99%  Weight:      Height:        Intake/Output Summary (Last 24 hours) at 05/06/2021 0017 Last data filed at 05/05/2021 0520 Gross per 24 hour  Intake 101.84 ml  Output --  Net 101.84 ml   Filed Weights   05/04/21 1505  Weight: 118.8 kg    Examination:   Constitutional: NAD, AAOx3 HEENT: conjunctivae and lids normal, EOMI CV: No cyanosis.   RESP: normal respiratory effort, on RA Extremities: No effusions, edema in BLE SKIN: warm, dry Neuro: II - XII grossly intact.   Psych: Normal mood and affect.  Appropriate judgement and reason   Data Reviewed: I have personally reviewed following labs and imaging studies  CBC: Recent Labs  Lab 05/04/21 1511  WBC 19.1*  NEUTROABS 17.4*  HGB 12.1  HCT 36.3  MCV 83.3  PLT 44*   Basic Metabolic Panel: Recent Labs  Lab 05/04/21 1511  NA 135  K 5.1  CL 104  CO2 24  GLUCOSE 125*  BUN 43*  CREATININE 1.07*  CALCIUM 8.6*  GFR: Estimated Creatinine Clearance: 56.7 mL/min (A) (by C-G formula based on SCr of 1.07 mg/dL (H)). Liver Function Tests: No results for input(s): AST, ALT, ALKPHOS, BILITOT, PROT, ALBUMIN in the last 168 hours. No results for input(s): LIPASE, AMYLASE in the last 168 hours. No results for input(s): AMMONIA in the last 168 hours. Coagulation Profile: Recent Labs  Lab 05/04/21 1511 05/05/21 0049  INR 1.5* 1.5*   Cardiac Enzymes: No results for input(s): CKTOTAL, CKMB, CKMBINDEX, TROPONINI in the last 168 hours. BNP (last 3 results) No results for input(s): PROBNP in the  last 8760 hours. HbA1C: No results for input(s): HGBA1C in the last 72 hours. CBG: No results for input(s): GLUCAP in the last 168 hours. Lipid Profile: No results for input(s): CHOL, HDL, LDLCALC, TRIG, CHOLHDL, LDLDIRECT in the last 72 hours. Thyroid Function Tests: No results for input(s): TSH, T4TOTAL, FREET4, T3FREE, THYROIDAB in the last 72 hours. Anemia Panel: No results for input(s): VITAMINB12, FOLATE, FERRITIN, TIBC, IRON, RETICCTPCT in the last 72 hours. Sepsis Labs: No results for input(s): PROCALCITON, LATICACIDVEN in the last 168 hours.  Recent Results (from the past 240 hour(s))  Resp Panel by RT-PCR (Flu A&B, Covid) Nasopharyngeal Swab     Status: None   Collection Time: 05/05/21 12:49 AM   Specimen: Nasopharyngeal Swab; Nasopharyngeal(NP) swabs in vial transport medium  Result Value Ref Range Status   SARS Coronavirus 2 by RT PCR NEGATIVE NEGATIVE Final    Comment: (NOTE) SARS-CoV-2 target nucleic acids are NOT DETECTED.  The SARS-CoV-2 RNA is generally detectable in upper respiratory specimens during the acute phase of infection. The lowest concentration of SARS-CoV-2 viral copies this assay can detect is 138 copies/mL. A negative result does not preclude SARS-Cov-2 infection and should not be used as the sole basis for treatment or other patient management decisions. A negative result may occur with  improper specimen collection/handling, submission of specimen other than nasopharyngeal swab, presence of viral mutation(s) within the areas targeted by this assay, and inadequate number of viral copies(<138 copies/mL). A negative result must be combined with clinical observations, patient history, and epidemiological information. The expected result is Negative.  Fact Sheet for Patients:  EntrepreneurPulse.com.au  Fact Sheet for Healthcare Providers:  IncredibleEmployment.be  This test is no t yet approved or cleared by the  [place] FDA and  has been authorized for detection and/or diagnosis of SARS-CoV-2 by FDA under an Emergency Use Authorization (EUA). This EUA will remain  in effect (meaning this test can be used) for the duration of the COVID-19 declaration under Section 564(b)(1) of the Act, 21 U.S.C.section 360bbb-3(b)(1), unless the authorization is terminated  or revoked sooner.       Influenza A by PCR NEGATIVE NEGATIVE Final   Influenza B by PCR NEGATIVE NEGATIVE Final    Comment: (NOTE) The Xpert Xpress SARS-CoV-2/FLU/RSV plus assay is intended as an aid in the diagnosis of influenza from Nasopharyngeal swab specimens and should not be used as a sole basis for treatment. Nasal washings and aspirates are unacceptable for Xpert Xpress SARS-CoV-2/FLU/RSV testing.  Fact Sheet for Patients: EntrepreneurPulse.com.au  Fact Sheet for Healthcare Providers: IncredibleEmployment.be  This test is not yet approved or cleared by the [place] FDA and has been authorized for detection and/or diagnosis of SARS-CoV-2 by FDA under an Emergency Use Authorization (EUA). This EUA will remain in effect (meaning this test can be used) for the duration of the COVID-19 declaration under Section 564(b)(1) of the Act, 21 U.S.C. section 360bbb-3(b)(1), unless  the authorization is terminated or revoked.  Performed at [facility], [address] [place], [place] [address]       Radiology Studies: PERIPHERAL VASCULAR CATHETERIZATION  Result Date: 05/05/2021 See surgical note for result.  US Venous Img Lower Unilateral Left  Result Date: 05/04/2021 CLINICAL DATA:  Left lower extremity pain and edema. EXAM: LEFT LOWER EXTREMITY VENOUS DOPPLER ULTRASOUND TECHNIQUE: Gray-scale sonography with graded compression, as well as color Doppler and duplex ultrasound were performed to evaluate the lower extremity deep venous systems from the level of the common femoral  vein and including the common femoral, femoral, profunda femoral, popliteal and calf veins including the posterior tibial, peroneal and gastrocnemius veins when visible. The superficial great saphenous vein was also interrogated. Spectral Doppler was utilized to evaluate flow at rest and with distal augmentation maneuvers in the common femoral, femoral and popliteal veins. COMPARISON:  None. FINDINGS: Contralateral Common Femoral Vein: Respiratory phasicity is normal and symmetric with the symptomatic side. No evidence of thrombus. Normal compressibility. Common Femoral Vein: No evidence of thrombus. Normal compressibility, respiratory phasicity and response to augmentation. Saphenofemoral Junction: No evidence of thrombus. Normal compressibility and flow on color Doppler imaging. Profunda Femoral Vein: No evidence of thrombus. Normal compressibility and flow on color Doppler imaging. Femoral Vein: No evidence of thrombus. Normal compressibility, respiratory phasicity and response to augmentation. Popliteal Vein: No evidence of thrombus. Normal compressibility, respiratory phasicity and response to augmentation. Calf Veins: There is evidence of thrombus in the more posterior of paired posterior tibial veins. Anteriorly oriented posterior tibial vein is normally patent. Superficial Great Saphenous Vein: Superficial thrombophlebitis is noted involving much of the visualized great saphenous vein. Thrombus does not extend to the saphenofemoral junction. Venous Reflux:  None. Other Findings:  No abnormal fluid collections. IMPRESSION: 1. Deep vein thrombus in posterior segment of paired posterior tibial veins. 2. Superficial thrombophlebitis of the left great saphenous vein. Electronically Signed   By: Aletta Edouard M.D.   On: 05/04/2021 16:57     Scheduled Meds:  amLODipine  5 mg Oral Daily   atorvastatin  80 mg Oral Daily   carvedilol  6.25 mg Oral BID WC   dexamethasone  4 mg Oral TID   enoxaparin (LOVENOX)  injection  1 mg/kg Subcutaneous Q12H   fluconazole  100 mg Oral Daily   gabapentin  200 mg Oral QHS   midazolam       pantoprazole  40 mg Oral q morning   Continuous Infusions:   LOS: 1 day     Enzo Bi, MD [facility] If 7PM-7AM, please contact night-coverage 05/06/2021, 12:17 AM

## 2021-05-05 NOTE — TOC Initial Note (Signed)
Transition of Care [facility]) - Initial/Assessment Note    Patient Details  Name: Anna Marsh MRN: [id number] Date of Birth: 09-23-1946  Transition [facility]) CM/SW Contact:    Beverly Sessions, RN Phone Number: 05/05/2021, 2:49 PM  Clinical Narrative:                 Patient admitted for DVT Patient with history of Pancreatic cancer metastatic to liver and brain undergoing brain radiation  Patient lives at home with daughter and son in law PCP Rosanna Randy - family transports  Ph[facility]acy [facility] Denies issues obtaining medication  Patient has WC, BSC, RW PT pending after IVC placement Patient has been to [facility] in the place  Daughter in agreement for home health at discharge and states she does not have a preference of agency.  Referral made to [facility] with [facility]  Referral for hospital bed made to [facility] with [facility]  Daughter aware that if SNF is recommended it will be difficult to obtain placement due to daily radiation treatment   Expected Discharge Plan: [place] Barriers to Discharge: Continued Medical Work up   Patient Goals and CMS Choice        Expected Discharge Plan and Services Expected Discharge Plan: [place]       Living arrangements for the past 2 months: Single Family Home                 DME Arranged: Hospital bed DME Agency: [facility] Date DME Agency Contacted: 05/05/21   Representative spoke with at DME Agency: Faythe Dingwall HH Arranged: PT, OT [place] Agency: [place] ([place]) Date HH Agency Contacted: 05/05/21   Representative spoke with at [place]: Corene Cornea  Prior Living Arrangements/Services Living arrangements for the past 2 months: [place] Lives with:: Adult Children Patient language and need for interpreter reviewed:: Yes Do you feel safe going back to the place where you live?: Yes      Need for Family Participation in Patient Care: Yes  (Comment) Care giver support system in place?: Yes (comment) Current home services: DME Criminal Activity/Legal Involvement Pertinent to Current Situation/Hospitalization: No - Comment as needed  Activities of Daily Living Home Assistive Devices/Equipment: Gilford Rile (specify type) ADL Screening (condition at time of admission) Patient's cognitive ability adequate to safely complete daily activities?: Yes Is the patient deaf or have difficulty hearing?: No Does the patient have difficulty seeing, even when wearing glasses/contacts?: No Does the patient have difficulty concentrating, remembering, or making decisions?: No Patient able to express need for assistance with ADLs?: Yes Does the patient have difficulty dressing or bathing?: No Independently performs ADLs?: No Communication: Independent Dressing (OT): Needs assistance Is this a change from baseline?: Pre-admission baseline Grooming: Needs assistance Is this a change from baseline?: Pre-admission baseline Feeding: Independent Bathing: Needs assistance Is this a change from baseline?: Pre-admission baseline Toileting: Needs assistance Is this a change from baseline?: Pre-admission baseline In/Out Bed: Needs assistance Is this a change from baseline?: Pre-admission baseline Walks in Home: Needs assistance Is this a change from baseline?: Pre-admission baseline Does the patient have difficulty walking or climbing stairs?: No Weakness of Legs: Both Weakness of Arms/Hands: None  Permission Sought/Granted                  Emotional Assessment         Alcohol / Substance Use: Not Applicable Psych Involvement: No (comment)  Admission diagnosis:  Thrombocytopenia ([place]) [D69.6] DVT (deep venous thrombosis) (HCC) [I82.409] Acute deep vein thrombosis (DVT) of tibial vein of left lower extremity ([place]) [I82.442] Patient Active Problem List   Diagnosis Date Noted   History of CVA (cerebrovascular accident) 05/05/2021   DVT  (deep venous thrombosis) ([place]) 05/05/2021   Leukocytosis 05/05/2021   Palliative care encounter    Syncope 04/18/2021   Liver metastases ([place])    Brain metastases ([place]) 04/17/2021   Pancreatic mass 04/17/2021   Altered mental status 04/17/2021   Paroxysmal atrial fibrillation ([place]) 07/26/2018   Right cavernous carotid stenosis 07/16/2018   Ischemic stroke ([place]) 07/16/2018   TIA (transient ischemic attack) 11/07/2016   Pre-diabetes 07/20/2015   Acute anxiety 07/20/2015   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   GERD (gastroesophageal reflux disease) 02/28/2015   Hypercholesterolemia 02/28/2015   Hypertension 02/28/2015   Cardiac defibrillator in place 02/28/2015   Block, bundle branch, left 02/28/2015   Adiposity 02/28/2015   Acid reflux 02/28/2015   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 [place] [address] Phone: [phone] Fax: [phone]  [facility] [place], [place] - [address] [address] Phone: [phone] Fax: [phone]  [id number] [place] [id number]: [phone]: [phone]    Social Determinants of Health (SDOH) Interventions    Readmission Risk Interventions No flowsheet data found.

## 2021-05-05 NOTE — Consult Note (Signed)
[facility] Vascular Consult Note  MRN : [id number]  Anna Marsh is a 75 y.o. (November 15, 1945) female who presents with chief complaint of  Chief Complaint  Patient presents with   Leg Pain   History of Present Illness:  Anna Marsh is a 75 y.o. 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  She denies noncompliance with her anticoagulation and does not miss any doses.  She denies chest pain or shortness of breath.   ED course: On arrival vitals within normal limits WBC 19,000, with platelets 44,000, creatinine 1.07 which is about her baseline.  Otherwise unremarkable   Imaging: Venous Doppler left lower extremity: DVT posterior segment of paired posterior tibial veins, superficial thrombophlebitis of the left great saphenous vein   Patient started on heparin infusion.    Vascular Surgery was consulted by Dr. Janese Banks for possible failure of anticoagulation.   Current Facility-Administered Medications  Medication Dose Route Frequency Provider Last Rate Last Admin   acetaminophen (TYLENOL) tablet 650 mg  650 mg Oral Q6H PRN Athena Masse, MD       Or   acetaminophen (TYLENOL) suppository 650 mg  650 mg Rectal Q6H PRN Athena Masse, MD       amLODipine (NORVASC) tablet 5 mg  5 mg Oral Daily Judd Gaudier V, MD   5 mg at 05/05/21 0935   atorvastatin (LIPITOR) tablet 80 mg  80 mg Oral Daily Judd Gaudier V, MD   80 mg at 05/05/21 0934   carvedilol (COREG) tablet 6.25 mg  6.25 mg Oral BID WC Athena Masse, MD       dexamethasone (DECADRON) tablet 4 mg  4 mg Oral TID Athena Masse, MD   4 mg at 05/05/21 1041   enoxaparin (LOVENOX) injection 120 mg  1 mg/kg Subcutaneous Q12H Dallie Piles, RPH   120 mg at 05/05/21 1041   fluconazole (DIFLUCAN) tablet 100 mg  100 mg Oral Daily Enzo Bi, MD   100 mg at 05/05/21 1041   gabapentin (NEURONTIN) capsule 200 mg  200 mg Oral QHS Enzo Bi, MD       HYDROcodone-acetaminophen (NORCO/VICODIN) 5-325 MG per tablet 1-2 tablet  1-2 tablet Oral Q4H PRN Athena Masse, MD       liver oil-zinc oxide (DESITIN) 40 % ointment   Topical PRN Athena Masse, MD       morphine 2 MG/ML injection 2 mg  2 mg Intravenous Q2H PRN Athena Masse, MD       ondansetron [facility]) tablet 4 mg  4 mg Oral Q6H PRN Athena Masse, MD       Or   ondansetron [facility]) injection 4 mg  4 mg Intravenous Q6H PRN Athena Masse, MD       pantoprazole (PROTONIX) EC tablet 40 mg  40 mg Oral q morning Enzo Bi, MD   40 mg at 05/05/21 1041   Past Medical History:  Diagnosis Date   AICD (automatic cardioverter/defibrillator) present    Anemia    Anxiety    AV block, complete (HCC)    Cervical radiculopathy    CHF (congestive heart failure) ([place])    Chronic systolic heart failure ([place])  GERD (gastroesophageal reflux disease)    History of cardiac arrest    Hypercholesteremia    Hypertension    Pancreatic cancer ([place])    stage 4   Pre-diabetes    Presence of permanent cardiac pacemaker 02/03/2015   [facility]  with AICD   Right arm weakness    Past Surgical History:  Procedure Laterality Date   ABDOMINAL HYSTERECTOMY     CATARACT EXTRACTION W/PHACO Left 12/18/2016   Procedure: CATARACT EXTRACTION PHACO AND INTRAOCULAR LENS PLACEMENT ([place])  left;  Surgeon: Ronnell Freshwater, MD;  Location: [place];  Service: Ophthalmology;  Laterality: Left;   CATARACT EXTRACTION W/PHACO Right 01/01/2017   Procedure: CATARACT EXTRACTION PHACO AND INTRAOCULAR LENS PLACEMENT ([place])  Right;  Surgeon: Ronnell Freshwater, MD;  Location: [place];  Service: Ophthalmology;  Laterality: Right;   HERNIA REPAIR     umbilical x 2   NECK SURGERY     PACEMAKER  IMPLANT  02/03/2015   [facility]   TONSILLECTOMY     Social History Social History   Tobacco Use   Smoking status: Former    Packs/day: 0.50    Years: 20.00    Pack years: 10.00    Types: Cigarettes    Quit date: 10/03/2003    Years since quitting: 17.6   Smokeless tobacco: Never  Vaping Use   Vaping Use: Never used  Substance Use Topics   Alcohol use: No   Drug use: No   Family History Family History  Problem Relation Age of Onset   Cancer Mother    Hyperlipidemia Father    Heart disease Father    Vascular Disease Father    Arthritis Sister    Colon cancer Sister    Migraines Brother    Alcohol abuse Sister    Dementia Sister    Bipolar disorder Sister    Heart attack Sister        around age 17   Arthritis Sister    Migraines Sister    Breast cancer Neg Hx   Denies PAD, Venous or renal disease.  Allergies  Allergen Reactions   Iodinated Diagnostic Agents Other (See Comments)    Causes migraines   Iodine     Contrast Dye causes migraines   Lisinopril Cough   Penicillins Rash    Has patient had a PCN reaction causing immediate rash, facial/tongue/throat swelling, SOB or lightheadedness with hypotension: Yes Has patient had a PCN reaction causing severe rash involving mucus membranes or skin necrosis: No Has patient had a PCN reaction that required hospitalization: No Has patient had a PCN reaction occurring within the last 10 years: Yes If all of the above answers are "NO", then may proceed with Cephalosporin use.   REVIEW OF SYSTEMS (Negative unless checked)  Constitutional: '[]'$ Weight loss  '[]'$ Fever  '[]'$ Chills Cardiac: '[]'$ Chest pain   '[]'$ Chest pressure   '[]'$ Palpitations   '[]'$ Shortness of breath when laying flat   '[]'$ Shortness of breath at rest   '[]'$ Shortness of breath with exertion. Vascular:  '[]'$ Pain in legs with walking   '[]'$ Pain in legs at rest   '[]'$ Pain in legs when laying flat   '[]'$ Claudication   '[]'$ Pain in feet when walking  '[]'$ Pain in feet at rest  '[]'$ Pain in feet when  laying flat   '[x]'$ History of DVT   '[]'$ Phlebitis   '[]'$ Swelling in legs   '[]'$ Varicose veins   '[]'$ Non-healing ulcers Pulmonary:   '[]'$ Uses home oxygen   '[]'$ Productive cough   '[]'$ Hemoptysis   '[]'$ Wheeze  '[]'$   COPD   '[]'$ Asthma Neurologic:  '[]'$ Dizziness  '[]'$ Blackouts   '[]'$ Seizures   '[]'$ History of stroke   '[]'$ History of TIA  '[]'$ Aphasia   '[]'$ Temporary blindness   '[]'$ Dysphagia   '[]'$ Weakness or numbness in arms   '[]'$ Weakness or numbness in legs Musculoskeletal:  '[]'$ Arthritis   '[]'$ Joint swelling   '[]'$ Joint pain   '[]'$ Low back pain Hematologic:  '[]'$ Easy bruising  '[]'$ Easy bleeding   '[x]'$ Hypercoagulable state   '[]'$ Anemic  '[]'$ Hepatitis Gastrointestinal:  '[]'$ Blood in stool   '[]'$ Vomiting blood  '[]'$ Gastroesophageal reflux/heartburn   '[]'$ Difficulty swallowing. Genitourinary:  '[]'$ Chronic kidney disease   '[]'$ Difficult urination  '[]'$ Frequent urination  '[]'$ Burning with urination   '[]'$ Blood in urine Skin:  '[]'$ Rashes   '[]'$ Ulcers   '[]'$ Wounds Psychological:  '[]'$ History of anxiety   '[]'$  History of major depression.  Physical Examination  Vitals:   05/05/21 0049 05/05/21 0454 05/05/21 0512 05/05/21 0748  BP: (!) 134/42 (!) 176/57 (!) 152/46 (!) 146/42  Pulse: 61 (!) 49 (!) 52 (!) 51  Resp: '20 20  19  '$ Temp:  97.7 F (36.5 C)  (!) 97.3 F (36.3 C)  TempSrc:  Oral    SpO2: 98% 95%  100%  Weight:      Height:  5' 2.99" (1.6 m)     Body mass index is 46.42 kg/m. Gen:  WD/WN, NAD Head: [place]/AT, No temporalis wasting. Prominent temp pulse not noted. Ear/Nose/Throat: Hearing grossly intact, nares w/o erythema or drainage, oropharynx w/o Erythema/Exudate Eyes: Sclera non-icteric, conjunctiva clear Neck: Trachea midline.  No JVD.  Pulmonary:  Good air movement, respirations not labored, equal bilaterally.  Cardiac: RRR, normal S1, S2. Vascular:  Vessel Right Left  Radial Palpable Palpable  Ulnar Palpable Palpable  Brachial Palpable Palpable  Carotid Palpable, without bruit Palpable, without bruit  Aorta Not palpable N/A  Femoral Palpable Palpable  Popliteal  Palpable Palpable  PT Palpable Palpable  DP Palpable Palpable   Gastrointestinal: soft, non-tender/non-distended. No guarding/reflex.  Musculoskeletal: M/S 5/5 throughout.  Extremities without ischemic changes.  No deformity or atrophy. No edema. Neurologic: Sensation grossly intact in extremities.  Symmetrical.  Speech is fluent. Motor exam as listed above. Psychiatric: Judgment intact, Mood & affect appropriate for pt's clinical situation. Dermatologic: No rashes or ulcers noted.  No cellulitis or open wounds. Lymph : No Cervical, Axillary, or Inguinal lymphadenopathy.  CBC Lab Results  Component Value Date   WBC 19.1 (H) 05/04/2021   HGB 12.1 05/04/2021   HCT 36.3 05/04/2021   MCV 83.3 05/04/2021   PLT 44 (L) 05/04/2021   BMET    Component Value Date/Time   NA 135 05/04/2021 1511   NA 142 04/07/2021 0945   NA 140 09/29/2014 1128   K 5.1 05/04/2021 1511   K 3.6 09/29/2014 1128   CL 104 05/04/2021 1511   CL 103 09/29/2014 1128   CO2 24 05/04/2021 1511   CO2 32 09/29/2014 1128   GLUCOSE 125 (H) 05/04/2021 1511   GLUCOSE 84 09/29/2014 1128   BUN 43 (H) 05/04/2021 1511   BUN 14 04/07/2021 0945   BUN 16 09/29/2014 1128   CREATININE 1.07 (H) 05/04/2021 1511   CREATININE 1.10 09/29/2014 1128   CALCIUM 8.6 (L) 05/04/2021 1511   CALCIUM 9.2 09/29/2014 1128   GFRNONAA 54 (L) 05/04/2021 1511   GFRNONAA 52 (L) 09/29/2014 1128   GFRAA 58 (L) 08/18/2020 0910   GFRAA >60 09/29/2014 1128   Estimated Creatinine Clearance: 56.7 mL/min (A) (by C-G formula based on SCr of 1.07 mg/dL (H)).  COAG Lab  Results  Component Value Date   INR 1.5 (H) 05/05/2021   INR 1.5 (H) 05/04/2021   INR 0.99 07/02/2018   Radiology CT Head Wo Contrast  Result Date: 04/17/2021 CLINICAL DATA:  Mental status change EXAM: CT HEAD WITHOUT CONTRAST TECHNIQUE: Contiguous axial images were obtained from the base of the skull through the vertex without intravenous contrast. COMPARISON:  CT brain 07/04/2018  FINDINGS: Brain: No hemorrhage is visualized. Hypodensity/edema within the left cerebellum and right posterior frontal subcortical white matter. No midline shift. Stable ventricle size. Vascular: No hyperdense vessels. Scattered carotid vascular calcification Skull: Normal. Negative for fracture or focal lesion. Sinuses/Orbits: No acute finding. Other: None IMPRESSION: 1. Focal hypodensities/areas of edema within the right frontal lobe white matter and the left cerebellum, favored to represent neoplasm/metastatic disease over infarcts, given findings on recent body imaging. Negative for acute intracranial hemorrhage. Electronically Signed   By: Donavan Foil M.D.   On: 04/17/2021 16:33   CT CHEST ABDOMEN PELVIS W CONTRAST  Result Date: 04/13/2021 CLINICAL DATA:  75 year old female with history of multiple liver lesions noted on prior outside CT examination concerning for metastatic disease to the liver. Follow-up study. EXAM: CT CHEST, ABDOMEN, AND PELVIS WITH CONTRAST TECHNIQUE: Multidetector CT imaging of the chest, abdomen and pelvis was performed following the standard protocol during bolus administration of intravenous contrast. CONTRAST:  1102m OMNIPAQUE IOHEXOL 350 MG/ML SOLN COMPARISON:  CT of the abdomen and pelvis 08/14/2014. No prior chest CT. FINDINGS: CT CHEST FINDINGS Cardiovascular: Heart size is normal. There is no significant pericardial fluid, thickening or pericardial calcification. There is aortic atherosclerosis, as well as atherosclerosis of the great vessels of the mediastinum and the coronary arteries, including calcified atherosclerotic plaque in the left anterior descending and right coronary arteries. Calcifications of the mitral annulus. Left-sided biventricular pacemaker device in place with lead tips terminating in the right atrium, right ventricular apex and overlying the lateral wall the left ventricle via the coronary sinus and coronary veins. Mediastinum/Nodes: No pathologically  enlarged mediastinal or hilar lymph nodes. Esophagus is unremarkable in appearance. No axillary lymphadenopathy. Lungs/Pleura: Elevation of the right hemidiaphragm. Some associated passive subsegmental atelectasis in the basal segments of the right lower lobe. No acute consolidative airspace disease. No pleural effusions. No definite suspicious appearing pulmonary nodules or masses are noted. Musculoskeletal: There are no aggressive appearing lytic or blastic lesions noted in the visualized portions of the skeleton. CT ABDOMEN PELVIS FINDINGS Hepatobiliary: Multiple hypovascular lesions are scattered throughout the hepatic parenchyma, likely reflective of widespread metastatic disease. The largest of these is in the left lobe of the liver straddling segments 2, 3, 4A and 4B (axial image 47 of series 2) measuring 5.2 x 3.7 cm. No intra or extrahepatic biliary ductal dilatation. Numerous tiny calcified gallstones lying dependently in the gallbladder. Gallbladder is not distended. No gallbladder wall thickening or pericholecystic fluid or surrounding inflammatory changes. Pancreas: In the tail of the pancreas there is a large hypovascular mass measuring 5.0 x 3.6 x 3.6 cm (coronal image 77 of series 6 and axial image 51 of series 2). Some curvilinear calcifications are noted along the posterior and superior wall of this lesion. This lesion makes contact with the undersurface of the proximal stomach with some obscuration of the intervening fat plane (best appreciated on coronal image 78 of series 6). This lesion is well separated from the superior mesenteric artery and vein as well as the portal vein. This lesion is in close proximity to the splenic artery and splenic  vein. Splenic vein appears markedly narrowed related to the lesion, but appears grossly patent at this time. No pancreatic ductal dilatation. No peripancreatic fluid collections or inflammatory changes. Spleen: Unremarkable. Adrenals/Urinary Tract:  Multiple low-attenuation lesions in both kidneys, compatible with simple cysts, largest of which measures 12.0 x 8.5 cm in the upper pole of the left kidney. No hydroureteronephrosis. Urinary bladder is nearly decompressed, but otherwise unremarkable in appearance. Stomach/Bowel: The appearance of the stomach is normal. No pathologic dilatation of small bowel or colon. Normal appendix. Vascular/Lymphatic: Aortic atherosclerosis, without evidence of aneurysm or dissection in the abdominal or pelvic vasculature. No lymphadenopathy noted in the abdomen or pelvis. Reproductive: Status post hysterectomy.  Ovaries are atrophic. Other: No significant volume of ascites.  No pneumoperitoneum. Musculoskeletal: There are no aggressive appearing lytic or blastic lesions noted in the visualized portions of the skeleton. IMPRESSION: 1. Large hypovascular mass in the tail of the pancreas concerning for primary pancreatic malignancy. This is intimately associated with the undersurface of the proximal stomach without definite direct invasion at this time. This is also intimately associated with the splenic artery and vein, and there are numerous hypovascular hepatic lesions concerning for widespread metastatic disease to the liver. Further evaluation with endoscopic ultrasound and consideration for biopsy is strongly recommended. 2. No definitive findings to suggest metastatic disease to the thorax. 3. No evidence of metastatic disease in the pelvis. 4. Cholelithiasis without evidence of acute cholecystitis at this time. 5. Aortic atherosclerosis, in addition to 2 vessel coronary artery disease. Assessment for potential risk factor modification, dietary therapy or pharmacologic therapy may be warranted, if clinically indicated. 6. There are calcifications of the mitral annulus. Echocardiographic correlation for evaluation of potential valvular dysfunction may be warranted if clinically indicated. 7. Additional incidental findings,  as above. Electronically Signed   By: Vinnie Langton M.D.   On: 04/13/2021 11:29   US Venous Img Lower Unilateral Left  Result Date: 05/04/2021 CLINICAL DATA:  Left lower extremity pain and edema. EXAM: LEFT LOWER EXTREMITY VENOUS DOPPLER ULTRASOUND TECHNIQUE: Gray-scale sonography with graded compression, as well as color Doppler and duplex ultrasound were performed to evaluate the lower extremity deep venous systems from the level of the common femoral vein and including the common femoral, femoral, profunda femoral, popliteal and calf veins including the posterior tibial, peroneal and gastrocnemius veins when visible. The superficial great saphenous vein was also interrogated. Spectral Doppler was utilized to evaluate flow at rest and with distal augmentation maneuvers in the common femoral, femoral and popliteal veins. COMPARISON:  None. FINDINGS: Contralateral Common Femoral Vein: Respiratory phasicity is normal and symmetric with the symptomatic side. No evidence of thrombus. Normal compressibility. Common Femoral Vein: No evidence of thrombus. Normal compressibility, respiratory phasicity and response to augmentation. Saphenofemoral Junction: No evidence of thrombus. Normal compressibility and flow on color Doppler imaging. Profunda Femoral Vein: No evidence of thrombus. Normal compressibility and flow on color Doppler imaging. Femoral Vein: No evidence of thrombus. Normal compressibility, respiratory phasicity and response to augmentation. Popliteal Vein: No evidence of thrombus. Normal compressibility, respiratory phasicity and response to augmentation. Calf Veins: There is evidence of thrombus in the more posterior of paired posterior tibial veins. Anteriorly oriented posterior tibial vein is normally patent. Superficial Great Saphenous Vein: Superficial thrombophlebitis is noted involving much of the visualized great saphenous vein. Thrombus does not extend to the saphenofemoral junction. Venous  Reflux:  None. Other Findings:  No abnormal fluid collections. IMPRESSION: 1. Deep vein thrombus in posterior segment of paired posterior tibial  veins. 2. Superficial thrombophlebitis of the left great saphenous vein. Electronically Signed   By: Aletta Edouard M.D.   On: 05/04/2021 16:57   DG Chest Port 1 View  Result Date: 04/17/2021 CLINICAL DATA:  Weakness.  Generalized abdominal pain. EXAM: PORTABLE CHEST 1 VIEW COMPARISON:  Radiograph 12/05/2016.  CT 04/13/2021 FINDINGS: Left-sided pacemaker in place. Normal heart size with stable mediastinal contours aortic atherosclerosis. Elevated right hemidiaphragm with adjacent compressive atelectasis. No acute airspace disease. No pulmonary edema or pleural effusion. No acute osseous abnormalities are seen. Presumed external artifact projects over the left upper thorax. IMPRESSION: No acute chest finding. Elevated right hemidiaphragm with adjacent compressive atelectasis. Electronically Signed   By: Keith Rake M.D.   On: 04/17/2021 16:15    Assessment/Plan  1. LLE DVT: Patient with recent diagnosis of PE started on Eliquis. Now with progressively worsening swelling and pain. Now with DVT (no DVT on last duplex). States compliance with Eliquis. Recommend IVC filter placement in setting failure of Eliquis with DVT. Procedure, risks and benefits explained to the patient and her daughter who was at the bedside. Will plan on insertion today.   2. Metastatic Cancer: Pancreas with mets to liver and brain.  Followed by oncology  Discussed with Dr. Francene Castle, PA-C  05/05/2021 12:18 PM  This note was created with Dragon medical transcription system.  Any error is purely unintentional.

## 2021-05-05 NOTE — H&P (Signed)
History and Physical    Anna Marsh [id number] DOB: Jul 25, 1946 DOA: 05/04/2021  PCP: Jerrol Banana., MD   Patient coming from: home  I have personally briefly reviewed patient's old medical records in [place]  Chief Complaint: left leg pain  HPI: Anna Marsh is a 75 y.o. 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  She denies noncompliance with her anticoagulation and does not miss any doses.  She denies chest pain or shortness of breath.  ED course: On arrival vitals within normal limits WBC 19,000, with platelets 44,000, creatinine 1.07 which is about her baseline.  Otherwise unremarkable  EKG, personally viewed and interpreted: Sinus rhythm at 62 with no acute ST-T wave changes  Imaging: Venous Doppler left lower extremity: DVT posterior segment of paired posterior tibial veins, superficial thrombophlebitis of the left great saphenous vein  Patient started on heparin infusion.  Hospitalist consulted for admission.  Review of Systems: As per HPI otherwise all other systems on review of systems negative.    Past Medical History:  Diagnosis Date   AICD (automatic cardioverter/defibrillator) present    Anemia    Anxiety    AV block, complete (HCC)    Cervical radiculopathy    CHF (congestive heart failure) (HCC)    Chronic systolic heart failure (HCC)    GERD (gastroesophageal reflux disease)    History of cardiac arrest    Hypercholesteremia    Hypertension    Pancreatic cancer (HCC)    stage 4   Pre-diabetes    Presence of permanent cardiac pacemaker 02/03/2015   [facility]  with AICD   Right arm weakness     Past Surgical History:  Procedure Laterality Date   ABDOMINAL  HYSTERECTOMY     CATARACT EXTRACTION W/PHACO Left 12/18/2016   Procedure: CATARACT EXTRACTION PHACO AND INTRAOCULAR LENS PLACEMENT ([place])  left;  Surgeon: Ronnell Freshwater, MD;  Location: [facility];  Service: Ophthalmology;  Laterality: Left;   CATARACT EXTRACTION W/PHACO Right 01/01/2017   Procedure: CATARACT EXTRACTION PHACO AND INTRAOCULAR LENS PLACEMENT ([place])  Right;  Surgeon: Ronnell Freshwater, MD;  Location: [facility];  Service: Ophthalmology;  Laterality: Right;   HERNIA REPAIR     umbilical x 2   NECK SURGERY     PACEMAKER IMPLANT  02/03/2015   [facility]   TONSILLECTOMY       reports that she quit smoking about 17 years ago. Her smoking use included cigarettes. She has a 10.00 pack-year smoking history. She has never used smokeless tobacco. She reports that she does not drink alcohol and does not use drugs.  Allergies  Allergen Reactions   Iodinated Diagnostic Agents Other (See Comments)    Causes migraines   Iodine     Contrast Dye causes migraines   Lisinopril Cough   Penicillins Rash    Has patient had a PCN reaction causing immediate rash, facial/tongue/throat swelling, SOB or lightheadedness with hypotension: Yes Has patient had a PCN reaction causing severe rash involving mucus membranes or skin necrosis: No Has patient had a PCN reaction that required hospitalization: No Has patient had a PCN reaction occurring within the last 10 years: Yes If all of the above answers are "NO", then may proceed with  Cephalosporin use.    Family History  Problem Relation Age of Onset   Cancer Mother    Hyperlipidemia Father    Heart disease Father    Vascular Disease Father    Arthritis Sister    Colon cancer Sister    Migraines Brother    Alcohol abuse Sister    Dementia Sister    Bipolar disorder Sister    Heart attack Sister        around age 67   Arthritis Sister    Migraines Sister    Breast cancer Neg Hx       Prior to Admission  medications   Medication Sig Start Date End Date Taking? Authorizing Provider  acetaminophen (TYLENOL) 500 MG tablet Take 1,000 mg by mouth every 6 (six) hours as needed for mild pain or moderate pain.   Yes [provider]  amLODipine (NORVASC) 5 MG tablet Take 1 tablet (5 mg total) by mouth daily. 04/20/21  Yes Hosie Poisson, MD  apixaban (ELIQUIS) 5 MG TABS tablet Take 5 mg by mouth 2 (two) times daily. 04/03/21 06/07/21 Yes [provider]  atorvastatin (LIPITOR) 80 MG tablet TAKE 1 TABLET BY MOUTH  DAILY 09/20/20  Yes Jerrol Banana., MD  butalbital-acetaminophen-caffeine (FIORICET) [phone] MG tablet TAKE ONE TABLET BY MOUTH EVERY 6 HOURS AS NEEDED FOR HEADACHE 04/20/21  Yes Jerrol Banana., MD  carvedilol (COREG) 6.25 MG tablet Take 6.25 mg by mouth 2 (two) times daily with a meal. 09/12/18 05/05/21 Yes [provider]  clopidogrel (PLAVIX) 75 MG tablet Take 75 mg by mouth daily. 02/25/19  Yes [provider]  cyanocobalamin 1000 MCG tablet Take 1,000 mcg by mouth daily.    Yes [provider]  dexamethasone (DECADRON) 4 MG tablet Take 2 tablets (8 mg total) by mouth 2 (two) times daily. Patient taking differently: Take 4 mg by mouth 3 (three) times daily. 04/19/21 05/19/21 Yes Hosie Poisson, MD  ezetimibe (ZETIA) 10 MG tablet Take 10 mg by mouth daily. 07/29/20  Yes [provider]  Ferrous Sulfate Dried 200 (65 Fe) MG TABS Take 1 tablet by mouth daily.   Yes [provider]  fluconazole (DIFLUCAN) 100 MG tablet Take 1 tablet (100 mg total) by mouth daily. 05/03/21  Yes Sherlene Shams, Richard, DO  furosemide (LASIX) 20 MG tablet TAKE 1 TABLET BY MOUTH  DAILY AS NEEDED 11/07/20  Yes Jerrol Banana., MD  gabapentin (NEURONTIN) 100 MG capsule TAKE 2 CAPSULES BY MOUTH AT BEDTIME 03/22/21  Yes Jerrol Banana., MD  loratadine (CLARITIN) 10 MG tablet Take 10 mg by mouth daily.    Yes [provider]  pantoprazole  (PROTONIX) 40 MG tablet TAKE 1 TABLET BY MOUTH IN  THE MORNING 07/06/20  Yes Jerrol Banana., MD  potassium chloride (KLOR-CON) 10 MEQ tablet TAKE 1 TABLET BY MOUTH  TWICE DAILY 11/04/20  Yes Jerrol Banana., MD  traMADol (ULTRAM) 50 MG tablet 1/2 to 1 pill every 6-8 hours for pain. 04/17/21  Yes Cammie Sickle, MD  diphenhydrAMINE (BENADRYL) 25 mg capsule Take 25 mg by mouth See admin instructions. Take 25 mg by mouth 1 hour prior to procedure involving contrast    [provider]  enoxaparin (LOVENOX) 100 MG/ML injection Inject 1 mL (100 mg total) into the skin every 12 (twelve) hours. 05/02/21   Sindy Guadeloupe, MD  nitroGLYCERIN (NITROSTAT) 0.4 MG SL tablet Place 1 tablet (0.4 mg total) under  the tongue every 5 (five) minutes as needed for chest pain. 04/21/21 04/21/22  Jerrol Banana., MD  promethazine (PHENERGAN) 25 MG tablet Take 1/2-1 tablet by mouth every 8 hours as needed for nausea. 06/26/19   Jerrol Banana., MD    Physical Exam: Vitals:   05/04/21 1504 05/04/21 1505 05/04/21 1926 05/05/21 0049  BP: 131/89  (!) 163/55 (!) 134/42  Pulse: 68  66 61  Resp: '18  20 20  '$ Temp: 97.8 F (36.6 C)  98.6 F (37 C)   TempSrc: Oral  Oral   SpO2: 96%  99% 98%  Weight:  118.8 kg    Height:  '5\' 3"'$  (1.6 m)       Vitals:   05/04/21 1504 05/04/21 1505 05/04/21 1926 05/05/21 0049  BP: 131/89  (!) 163/55 (!) 134/42  Pulse: 68  66 61  Resp: '18  20 20  '$ Temp: 97.8 F (36.6 C)  98.6 F (37 C)   TempSrc: Oral  Oral   SpO2: 96%  99% 98%  Weight:  118.8 kg    Height:  '5\' 3"'$  (1.6 m)        Constitutional: Alert and oriented x 3 . Not in any apparent distress HEENT:      Head: Normocephalic and atraumatic.         Eyes: PERLA, EOMI, Conjunctivae are normal. Sclera is non-icteric.       Mouth/Throat: Mucous membranes are moist.       Neck: Supple with no signs of meningismus. Cardiovascular: Regular rate and rhythm. No murmurs, gallops, or rubs. 2+  symmetrical distal pulses are present . No JVD. No LE edema Respiratory: Respiratory effort normal .Lungs sounds clear bilaterally. No wheezes, crackles, or rhonchi.  Gastrointestinal: Soft, non tender, and non distended with positive bowel sounds.  Genitourinary: No CVA tenderness. Musculoskeletal: Nontender with normal range of motion in all extremities. No cyanosis, or erythema of extremities. Neurologic:  Face is symmetric. Moving all extremities. No gross focal neurologic deficits . Skin: Skin is warm, dry.  No rash or ulcers Psychiatric: Mood and affect are normal    Labs on Admission: I have personally reviewed following labs and imaging studies  CBC: Recent Labs  Lab 05/04/21 1511  WBC 19.1*  NEUTROABS 17.4*  HGB 12.1  HCT 36.3  MCV 83.3  PLT 44*   Basic Metabolic Panel: Recent Labs  Lab 05/04/21 1511  NA 135  K 5.1  CL 104  CO2 24  GLUCOSE 125*  BUN 43*  CREATININE 1.07*  CALCIUM 8.6*   GFR: Estimated Creatinine Clearance: 56.7 mL/min (A) (by C-G formula based on SCr of 1.07 mg/dL (H)). Liver Function Tests: No results for input(s): AST, ALT, ALKPHOS, BILITOT, PROT, ALBUMIN in the last 168 hours. No results for input(s): LIPASE, AMYLASE in the last 168 hours. No results for input(s): AMMONIA in the last 168 hours. Coagulation Profile: Recent Labs  Lab 05/04/21 1511 05/05/21 0049  INR 1.5* 1.5*   Cardiac Enzymes: No results for input(s): CKTOTAL, CKMB, CKMBINDEX, TROPONINI in the last 168 hours. BNP (last 3 results) No results for input(s): PROBNP in the last 8760 hours. HbA1C: No results for input(s): HGBA1C in the last 72 hours. CBG: No results for input(s): GLUCAP in the last 168 hours. Lipid Profile: No results for input(s): CHOL, HDL, LDLCALC, TRIG, CHOLHDL, LDLDIRECT in the last 72 hours. Thyroid Function Tests: No results for input(s): TSH, T4TOTAL, FREET4, T3FREE, THYROIDAB in the last 72 hours. Anemia Panel:  No results for input(s):  VITAMINB12, FOLATE, FERRITIN, TIBC, IRON, RETICCTPCT in the last 72 hours. Urine analysis:    Component Value Date/Time   COLORURINE AMBER (A) 04/17/2021 1246   APPEARANCEUR TURBID (A) 04/17/2021 1246   LABSPEC 1.025 04/17/2021 1246   PHURINE 5.0 04/17/2021 1246   GLUCOSEU NEGATIVE 04/17/2021 1246   HGBUR NEGATIVE 04/17/2021 1246   [place] 04/17/2021 [place] 04/17/2021 1246   PROTEINUR 30 (A) 04/17/2021 1246   NITRITE NEGATIVE 04/17/2021 1246   LEUKOCYTESUR SMALL (A) 04/17/2021 1246    Radiological Exams on Admission: US Venous Img Lower Unilateral Left  Result Date: 05/04/2021 CLINICAL DATA:  Left lower extremity pain and edema. EXAM: LEFT LOWER EXTREMITY VENOUS DOPPLER ULTRASOUND TECHNIQUE: Gray-scale sonography with graded compression, as well as color Doppler and duplex ultrasound were performed to evaluate the lower extremity deep venous systems from the level of the common femoral vein and including the common femoral, femoral, profunda femoral, popliteal and calf veins including the posterior tibial, peroneal and gastrocnemius veins when visible. The superficial great saphenous vein was also interrogated. Spectral Doppler was utilized to evaluate flow at rest and with distal augmentation maneuvers in the common femoral, femoral and popliteal veins. COMPARISON:  None. FINDINGS: Contralateral Common Femoral Vein: Respiratory phasicity is normal and symmetric with the symptomatic side. No evidence of thrombus. Normal compressibility. Common Femoral Vein: No evidence of thrombus. Normal compressibility, respiratory phasicity and response to augmentation. Saphenofemoral Junction: No evidence of thrombus. Normal compressibility and flow on color Doppler imaging. Profunda Femoral Vein: No evidence of thrombus. Normal compressibility and flow on color Doppler imaging. Femoral Vein: No evidence of thrombus. Normal compressibility, respiratory phasicity and response to  augmentation. Popliteal Vein: No evidence of thrombus. Normal compressibility, respiratory phasicity and response to augmentation. Calf Veins: There is evidence of thrombus in the more posterior of paired posterior tibial veins. Anteriorly oriented posterior tibial vein is normally patent. Superficial Great Saphenous Vein: Superficial thrombophlebitis is noted involving much of the visualized great saphenous vein. Thrombus does not extend to the saphenofemoral junction. Venous Reflux:  None. Other Findings:  No abnormal fluid collections. IMPRESSION: 1. Deep vein thrombus in posterior segment of paired posterior tibial veins. 2. Superficial thrombophlebitis of the left great saphenous vein. Electronically Signed   By: Aletta Edouard M.D.   On: 05/04/2021 16:57     Assessment/Plan 75 year old female with history of pancreatic cancer metastatic to liver and brain undergoing brain radiation, CHF s/p AICD, HTN, history of CVA, history of PE on Eliquis presenting with left lower extremity swelling x1 month, associated with pain x2 days.  Patient is also on Plavix.        DVT left lower extremity, with history of PE, on Eliquis -Continue heparin infusion - Consider switching to Lovenox or different NOAC - Consider oncology/hematology consult in the a.m.  Sees Dr. 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 - Continue dexamethasone - Leukocytosis, likely related to prednisone therapy for brain mets  Thrombocytopenia - Secondary to chemotherapy -Monitor for bleeding in view of Heparin infusion  Essential hypertension - Continue amlodipine    Chronic systolic CHF s/p AICD -Continue carvedilol    History of CVA (cerebrovascular accident) - Continue atorvastatin and clopidogrel  DVT prophylaxis: Full dose heparin Code Status: full code  Family Communication:  none  Disposition Plan: Back to previous home environment Consults called: none  Status:observation    Athena Masse  MD [facility]     05/05/2021,  1:39 AM

## 2021-05-05 NOTE — Progress Notes (Signed)
ANTICOAGULATION CONSULT NOTE - Initial Consult  Pharmacy Consult for Heparin  Indication: DVT  Allergies  Allergen Reactions   Iodinated Diagnostic Agents Other (See Comments)    Causes migraines   Iodine     Contrast Dye causes migraines   Lisinopril Cough   Penicillins Rash    Has patient had a PCN reaction causing immediate rash, facial/tongue/throat swelling, SOB or lightheadedness with hypotension: Yes Has patient had a PCN reaction causing severe rash involving mucus membranes or skin necrosis: No Has patient had a PCN reaction that required hospitalization: No Has patient had a PCN reaction occurring within the last 10 years: Yes If all of the above answers are "NO", then may proceed with Cephalosporin use.    Patient Measurements: Height: '5\' 3"'$  (160 cm) Weight: 118.8 kg (262 lb) IBW/kg (Calculated) : 52.4 Heparin Dosing Weight: 81.5 kg   Vital Signs: Temp: 98.6 F (37 C) (08/03 1926) Temp Source: Oral (08/03 1926) BP: 134/42 (08/04 0049) Pulse Rate: 61 (08/04 0049)  Labs: Recent Labs    05/04/21 1511  HGB 12.1  HCT 36.3  PLT 44*  LABPROT 18.5*  INR 1.5*  CREATININE 1.07*    Estimated Creatinine Clearance: 56.7 mL/min (A) (by C-G formula based on SCr of 1.07 mg/dL (H)).   Medical History: Past Medical History:  Diagnosis Date   AICD (automatic cardioverter/defibrillator) present    Anemia    Anxiety    AV block, complete (HCC)    Cervical radiculopathy    CHF (congestive heart failure) (HCC)    Chronic systolic heart failure (HCC)    GERD (gastroesophageal reflux disease)    History of cardiac arrest    Hypercholesteremia    Hypertension    Pancreatic cancer (HCC)    stage 4   Pre-diabetes    Presence of permanent cardiac pacemaker 02/03/2015   [facility]  with AICD   Right arm weakness     Medications:  (Not in a hospital admission)   Assessment: Pharmacy consulted to dose heparin in this 75 year old female admitted with DVT.  Pt was on  Eliquis 5 mg PO BID at home,  last dose on 8/3 AM , did not receive evening dose.   Pt was also ordered lovenox at home to start on 8/7 in anticipation for upcoming procedure.   CrCl = 56.7 ml/min   Goal of Therapy:  HL : 0.3 - 0.7 aPTT :   66 - 102 Check CBC daily   Plan:  Will draw baseline aptt, HL and INR.  Will order heparin 5000 units IV X 1 bolus and start drip at 1400 units/hr.  Will use aPTT to guide dosing until HL and aPTT are therapeutic. Will check HL and CBC daily.   Rim Thatch D 05/05/2021,1:10 AM

## 2021-05-05 NOTE — ED Provider Notes (Signed)
[facility] Emergency Department Provider Note  ____________________________________________  Time seen: Approximately 12:50 AM  I have reviewed the triage vital signs and the nursing notes.   HISTORY  Chief Complaint Leg Pain   HPI Anna Marsh is a 75 y.o. 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  Patient seems to have had some chronic left leg pain for about a month.  She was diagnosed with a PE a month ago and started on Eliquis on top of her Plavix that she takes for heart disease.  At that time a Doppler study was done which was negative for DVT.  Over the last 24 to 48 hours patient reports that the leg has become more swollen and more painful.  She denies missing any doses of her anticoagulation.  She denies any chest pain or shortness of breath.  She denies any trauma to the leg.  Patient is currently on prednisone for swelling associated with the brain mets.   Past Medical History:  Diagnosis Date   AICD (automatic cardioverter/defibrillator) present    Anemia    Anxiety    AV block, complete (HCC)    Cervical radiculopathy    CHF (congestive heart failure) (HCC)    Chronic systolic heart failure (HCC)    GERD (gastroesophageal reflux disease)    History of cardiac arrest    Hypercholesteremia    Hypertension    Pancreatic cancer ([place])    stage 4   Pre-diabetes    Presence of permanent cardiac pacemaker 02/03/2015   [facility]  with AICD   Right arm weakness     Patient Active Problem List   Diagnosis Date Noted   Palliative care encounter    Syncope 04/18/2021   Liver metastases ([place])    Brain metastases ([place]) 04/17/2021   Pancreatic mass 04/17/2021   Altered mental status 04/17/2021   Paroxysmal atrial fibrillation ([place]) 07/26/2018   Right cavernous carotid  stenosis 07/16/2018   Ischemic stroke ([place]) 07/16/2018   TIA (transient ischemic attack) 11/07/2016   Pre-diabetes 07/20/2015   Acute anxiety 07/20/2015   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   GERD (gastroesophageal reflux disease) 02/28/2015   Hypercholesterolemia 02/28/2015   Hypertension 02/28/2015   Cardiac defibrillator in place 02/28/2015   Block, bundle branch, left 02/28/2015   Adiposity 02/28/2015   Acid reflux 02/28/2015   Complete atrioventricular block (HCC) [id number]   Chronic systolic CHF (congestive heart failure), NYHA class 3 ([place]) 02/24/2015   H/O cardiac catheterization 02/24/2015   Combined fat and carbohydrate induced hyperlipemia 02/15/2015   Cardiomyopathy ([place]) 02/01/2015   Angina pectoris ([place]) 12/25/2013   Arthritis 12/25/2013   Hallux abducto valgus 12/25/2013    Past Surgical History:  Procedure Laterality Date   ABDOMINAL HYSTERECTOMY     CATARACT EXTRACTION W/PHACO Left 12/18/2016   Procedure: CATARACT EXTRACTION PHACO AND INTRAOCULAR LENS PLACEMENT ([place])  left;  Surgeon: Ronnell Freshwater, MD;  Location: [place];  Service: Ophthalmology;  Laterality: Left;   CATARACT EXTRACTION W/PHACO Right 01/01/2017   Procedure: CATARACT EXTRACTION PHACO AND INTRAOCULAR LENS PLACEMENT ([place])  Right;  Surgeon: Ronnell Freshwater, MD;  Location: [place];  Service: Ophthalmology;  Laterality: Right;   HERNIA REPAIR     umbilical x 2   NECK SURGERY     PACEMAKER IMPLANT  02/03/2015   [facility]   TONSILLECTOMY  Prior to Admission medications   Medication Sig Start Date End Date Taking? Authorizing Provider  acetaminophen (TYLENOL) 500 MG tablet Take 1,000 mg by mouth every 6 (six) hours as needed for mild pain or moderate pain.   Yes [provider]  amLODipine (NORVASC) 5 MG tablet Take 1 tablet (5 mg total) by mouth daily. 04/20/21  Yes Hosie Poisson, MD  apixaban (ELIQUIS) 5 MG TABS tablet Take 5 mg by mouth 2  (two) times daily. 04/03/21 06/07/21 Yes [provider]  atorvastatin (LIPITOR) 80 MG tablet TAKE 1 TABLET BY MOUTH  DAILY 09/20/20  Yes Jerrol Banana., MD  butalbital-acetaminophen-caffeine (FIORICET) [phone] MG tablet TAKE ONE TABLET BY MOUTH EVERY 6 HOURS AS NEEDED FOR HEADACHE 04/20/21  Yes Jerrol Banana., MD  carvedilol (COREG) 6.25 MG tablet Take 6.25 mg by mouth 2 (two) times daily with a meal. 09/12/18 05/05/21 Yes [provider]  clopidogrel (PLAVIX) 75 MG tablet Take 75 mg by mouth daily. 02/25/19  Yes [provider]  cyanocobalamin 1000 MCG tablet Take 1,000 mcg by mouth daily.    Yes [provider]  dexamethasone (DECADRON) 4 MG tablet Take 2 tablets (8 mg total) by mouth 2 (two) times daily. Patient taking differently: Take 4 mg by mouth 3 (three) times daily. 04/19/21 05/19/21 Yes Hosie Poisson, MD  ezetimibe (ZETIA) 10 MG tablet Take 10 mg by mouth daily. 07/29/20  Yes [provider]  Ferrous Sulfate Dried 200 (65 Fe) MG TABS Take 1 tablet by mouth daily.   Yes [provider]  fluconazole (DIFLUCAN) 100 MG tablet Take 1 tablet (100 mg total) by mouth daily. 05/03/21  Yes Sherlene Shams, Richard, DO  furosemide (LASIX) 20 MG tablet TAKE 1 TABLET BY MOUTH  DAILY AS NEEDED 11/07/20  Yes Jerrol Banana., MD  gabapentin (NEURONTIN) 100 MG capsule TAKE 2 CAPSULES BY MOUTH AT BEDTIME 03/22/21  Yes Jerrol Banana., MD  loratadine (CLARITIN) 10 MG tablet Take 10 mg by mouth daily.    Yes [provider]  pantoprazole (PROTONIX) 40 MG tablet TAKE 1 TABLET BY MOUTH IN  THE MORNING 07/06/20  Yes Jerrol Banana., MD  potassium chloride (KLOR-CON) 10 MEQ tablet TAKE 1 TABLET BY MOUTH  TWICE DAILY 11/04/20  Yes Jerrol Banana., MD  traMADol (ULTRAM) 50 MG tablet 1/2 to 1 pill every 6-8 hours for pain. 04/17/21  Yes Cammie Sickle, MD  diphenhydrAMINE (BENADRYL) 25 mg capsule Take 25 mg by mouth See  admin instructions. Take 25 mg by mouth 1 hour prior to procedure involving contrast    [provider]  enoxaparin (LOVENOX) 100 MG/ML injection Inject 1 mL (100 mg total) into the skin every 12 (twelve) hours. 05/02/21   Sindy Guadeloupe, MD  nitroGLYCERIN (NITROSTAT) 0.4 MG SL tablet Place 1 tablet (0.4 mg total) under the tongue every 5 (five) minutes as needed for chest pain. 04/21/21 04/21/22  Jerrol Banana., MD  promethazine (PHENERGAN) 25 MG tablet Take 1/2-1 tablet by mouth every 8 hours as needed for nausea. 06/26/19   Jerrol Banana., MD    Allergies Iodinated diagnostic agents, Iodine, Lisinopril, and Penicillins  Family History  Problem Relation Age of Onset   Cancer Mother    Hyperlipidemia Father    Heart disease Father    Vascular Disease Father    Arthritis Sister    Colon cancer Sister    Migraines Brother  Alcohol abuse Sister    Dementia Sister    Bipolar disorder Sister    Heart attack Sister        around age 52   Arthritis Sister    Migraines Sister    Breast cancer Neg Hx     Social History Social History   Tobacco Use   Smoking status: Former    Packs/day: 0.50    Years: 20.00    Pack years: 10.00    Types: Cigarettes    Quit date: 10/03/2003    Years since quitting: 17.6   Smokeless tobacco: Never  Vaping Use   Vaping Use: Never used  Substance Use Topics   Alcohol use: No   Drug use: No    Review of Systems  Constitutional: Negative for fever. Eyes: Negative for visual changes. ENT: Negative for sore throat. Neck: No neck pain  Cardiovascular: Negative for chest pain. Respiratory: Negative for shortness of breath. Gastrointestinal: Negative for abdominal pain, vomiting or diarrhea. Genitourinary: Negative for dysuria. Musculoskeletal: Negative for back pain. + LLE pain and swelling Skin: Negative for rash. Neurological: Negative for headaches, weakness or numbness. Psych: No SI or  HI  ____________________________________________   PHYSICAL EXAM:  VITAL SIGNS: ED Triage Vitals  Enc Vitals Group     BP 05/04/21 1504 131/89     Pulse Rate 05/04/21 1504 68     Resp 05/04/21 1504 18     Temp 05/04/21 1504 97.8 F (36.6 C)     Temp Source 05/04/21 1504 Oral     SpO2 05/04/21 1504 96 %     Weight 05/04/21 1505 262 lb (118.8 kg)     Height 05/04/21 1505 '5\' 3"'$  (1.6 m)     Head Circumference --      Peak Flow --      Pain Score 05/04/21 1505 0     Pain Loc --      Pain Edu? --      Excl. in [place]? --     Constitutional: Alert and oriented. Well appearing and in no apparent distress. HEENT:      Head: Normocephalic and atraumatic.         Eyes: Conjunctivae are normal. Sclera is non-icteric.       Mouth/Throat: Mucous membranes are moist.       Neck: Supple with no signs of meningismus. Cardiovascular: Regular rate and rhythm. No murmurs, gallops, or rubs. 2+ symmetrical distal pulses are present in all extremities. No JVD. Respiratory: Normal respiratory effort. Lungs are clear to auscultation bilaterally.  Gastrointestinal: Soft, non tender, and non distended with positive bowel sounds. No rebound or guarding. Genitourinary: No CVA tenderness. Musculoskeletal:  No edema, cyanosis, or erythema of extremities.  Slight asymmetric swelling of the left lower extremity when compared to the right Neurologic: Normal speech and language. Face is symmetric. Moving all extremities. No gross focal neurologic deficits are appreciated. Skin: Skin is warm, dry and intact. No rash noted. Psychiatric: Mood and affect are normal. Speech and behavior are normal.  ____________________________________________   LABS (all labs ordered are listed, but only abnormal results are displayed)  Labs Reviewed  CBC WITH DIFFERENTIAL/PLATELET - Abnormal; Notable for the following components:      Result Value   WBC 19.1 (*)    RDW 16.3 (*)    Platelets 44 (*)    Neutro Abs 17.4 (*)     Lymphs Abs 0.4 (*)    Abs Immature Granulocytes 0.39 (*)    All  other components within normal limits  BASIC METABOLIC PANEL - Abnormal; Notable for the following components:   Glucose, Bld 125 (*)    BUN 43 (*)    Creatinine, Ser 1.07 (*)    Calcium 8.6 (*)    GFR, Estimated 54 (*)    All other components within normal limits  PROTIME-INR - Abnormal; Notable for the following components:   Prothrombin Time 18.5 (*)    INR 1.5 (*)    All other components within normal limits  RESP PANEL BY RT-PCR (FLU A&B, COVID) [id number]  HEPARIN LEVEL (UNFRACTIONATED)  APTT  PROTIME-INR   ____________________________________________  EKG  ED ECG REPORT I, Rudene Re, the attending physician, personally viewed and interpreted this ECG.  Paced rhythm with a rate of 62, no concordant ST elevations ____________________________________________  RADIOLOGY  I have personally reviewed the images performed during this visit and I agree with the Radiologist's read.   Interpretation by Radiologist:  US Venous Img Lower Unilateral Left  Result Date: 05/04/2021 CLINICAL DATA:  Left lower extremity pain and edema. EXAM: LEFT LOWER EXTREMITY VENOUS DOPPLER ULTRASOUND TECHNIQUE: Gray-scale sonography with graded compression, as well as color Doppler and duplex ultrasound were performed to evaluate the lower extremity deep venous systems from the level of the common femoral vein and including the common femoral, femoral, profunda femoral, popliteal and calf veins including the posterior tibial, peroneal and gastrocnemius veins when visible. The superficial great saphenous vein was also interrogated. Spectral Doppler was utilized to evaluate flow at rest and with distal augmentation maneuvers in the common femoral, femoral and popliteal veins. COMPARISON:  None. FINDINGS: Contralateral Common Femoral Vein: Respiratory phasicity is normal and symmetric with the symptomatic side. No evidence of thrombus.  Normal compressibility. Common Femoral Vein: No evidence of thrombus. Normal compressibility, respiratory phasicity and response to augmentation. Saphenofemoral Junction: No evidence of thrombus. Normal compressibility and flow on color Doppler imaging. Profunda Femoral Vein: No evidence of thrombus. Normal compressibility and flow on color Doppler imaging. Femoral Vein: No evidence of thrombus. Normal compressibility, respiratory phasicity and response to augmentation. Popliteal Vein: No evidence of thrombus. Normal compressibility, respiratory phasicity and response to augmentation. Calf Veins: There is evidence of thrombus in the more posterior of paired posterior tibial veins. Anteriorly oriented posterior tibial vein is normally patent. Superficial Great Saphenous Vein: Superficial thrombophlebitis is noted involving much of the visualized great saphenous vein. Thrombus does not extend to the saphenofemoral junction. Venous Reflux:  None. Other Findings:  No abnormal fluid collections. IMPRESSION: 1. Deep vein thrombus in posterior segment of paired posterior tibial veins. 2. Superficial thrombophlebitis of the left great saphenous vein. Electronically Signed   By: Aletta Edouard M.D.   On: 05/04/2021 16:57     ____________________________________________   PROCEDURES  Procedure(s) performed: yes .1-3 Lead EKG Interpretation  Date/Time: 05/05/2021 12:52 AM Performed by: Rudene Re, MD Authorized by: Rudene Re, MD     Interpretation: non-specific     ECG rate assessment: normal     Rhythm: sinus rhythm     Ectopy: none     Conduction: normal     Critical Care performed:  None ____________________________________________   INITIAL IMPRESSION / ASSESSMENT AND PLAN / ED COURSE  75 y.o. 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  Patient has slightly asymmetric swelling of the left lower extremity when compared to the right with  strong distal pulses.  No signs of infection.  Doppler studies concerning for an acute DVT of the left lower extremity on the tibial veins.  Review of patient's recent medical records from a month ago showed a Doppler study with no signs of DVT.  Patient has been anticoagulated for the last month.  Therefore patient is to be admitted on IV heparin for breakthrough clot.  She has no chest pain, no shortness of breath, no tachypnea, no tachycardia, no hypoxia therefore no need for repeat CT at this time.  Will consult the hospitalist service for admission.  History gathered from patient and her daughter who is at bedside.  Plan discussed with both of them.  Patient placed on telemetry for close monitoring of her respiratory status     _____________________________________________ Please note:  Patient was evaluated in Emergency Department today for the symptoms described in the history of present illness. Patient was evaluated in the context of the global COVID-19 pandemic, which necessitated consideration that the patient might be at risk for infection with the SARS-CoV-2 virus that causes COVID-19. Institutional protocols and algorithms that pertain to the evaluation of patients at risk for COVID-19 are in a state of rapid change based on information released by regulatory bodies including the CDC and federal and state organizations. These policies and algorithms were followed during the patient's care in the ED.  Some ED evaluations and interventions may be delayed as a result of limited staffing during the pandemic.   [place] Controlled Substance Database was reviewed by me. ____________________________________________   FINAL CLINICAL IMPRESSION(S) / ED DIAGNOSES   Final diagnoses:  Acute deep vein thrombosis (DVT) of tibial vein of left lower extremity (HCC)   Thrombocytopenia (HCC)      NEW MEDICATIONS STARTED DURING THIS VISIT:  ED Discharge Orders     None        Note:  This document was prepared using Dragon voice recognition software and may include unintentional dictation errors.     Alfred Levins, [place], MD 05/05/21 [phone]

## 2021-05-05 NOTE — Progress Notes (Signed)
[facility] [facility]) Hospital Liaison note:  This patient is currently enrolled in [facility] outpatient-based Palliative Care. Will continue to follow for disposition.  Please call with any outpatient palliative questions or concerns.  Thank you, Lorelee Market, LPN [facility] Liaison [phone]

## 2021-05-05 NOTE — Op Note (Signed)
[facility]   OPERATIVE NOTE    PRE-OPERATIVE DIAGNOSIS: DVT with PE; pancreatic mass requiring biopsy  POST-OPERATIVE DIAGNOSIS: Same  PROCEDURE: 1.   Ultrasound guidance for vascular access to the right common femoral vein 2.   Catheter placement into the inferior vena cava 3.   Inferior venacavogram 4.   Placement of a Denali IVC filter  SURGEON: Hortencia Pilar  ASSISTANT(S): None  ANESTHESIA: Conscious sedation was administered by the interventional radiology RN under my direct supervision. IV Versed plus fentanyl were utilized. Continuous ECG, pulse oximetry and blood pressure was monitored throughout the entire procedure. Conscious sedation was for a total of 8 minutes 30 seconds.  ESTIMATED BLOOD LOSS: minimal  FINDING(S): 1.  Patent IVC  SPECIMEN(S):  none  INDICATIONS:   Anna Marsh is a 75 y.o. y.o. female who presents with acute DVT with recent PE.  A large pancreatic mass has been identified which requires biopsy and therefore cessation of anticoagulation.  Inferior vena cava filter is indicated for this reason.  Risks and benefits including filter thrombosis, migration, fracture, bleeding, and infection were all discussed.  We discussed that all IVC filters that we place can be removed if desired from the patient once the need for the filter has passed.    DESCRIPTION: After obtaining full informed written consent, the patient was brought back to the vascular suite. The skin was sterilely prepped and draped in a sterile surgical field was created. Ultrasound was placed in a sterile sleeve. The right common femoral vein was echolucent and compressible indicating patency. Image was recorded for the permanent record. The puncture was made under continuous real-time ultrasound guidance.  The right common femoral vein was accessed under direct ultrasound guidance without difficulty with a micropuncture needle. Microwire was then advanced under  fluoroscopic guidance without difficulty. Micro-sheath was then inserted and a J-wire was then placed. The dilator is passed over the wire and the delivery sheath was placed into the inferior vena cava.  Inferior venacavogram was performed. This demonstrated a patent IVC with the level of the renal veins at L2.  The filter was then deployed into the inferior vena cava at the level of L3 just below the renal veins. The delivery sheath was then removed. Pressure was held. Sterile dressings were placed. The patient tolerated the procedure well and was taken to the recovery room in stable condition.  Interpretation: Inferior vena cava is widely patent with renal blushes at the L2 level.  Measures 18 mm in diameter.  Denali filter is deployed at the L3 level in vertical orientation  COMPLICATIONS: None  CONDITION: Stable  Hortencia Pilar  05/05/2021, 4:09 PM

## 2021-05-05 NOTE — Progress Notes (Signed)
ANTICOAGULATION CONSULT NOTE  Pharmacy Consult for Heparin  Indication: DVT  Patient Measurements: Height: 5' 2.99" (160 cm) Weight: 118.8 kg (262 lb) IBW/kg (Calculated) : 52.38 Heparin Dosing Weight: 81.5 kg   Vital Signs: Temp: 97.3 F (36.3 C) (08/04 0748) Temp Source: Oral (08/04 0454) BP: 146/42 (08/04 0748) Pulse Rate: 51 (08/04 0748)  Labs: Recent Labs    05/04/21 1511 05/05/21 0049  HGB 12.1  --   HCT 36.3  --   PLT 44*  --   APTT  --  24  LABPROT 18.5* 18.2*  INR 1.5* 1.5*  HEPARINUNFRC  --  >1.10*  CREATININE 1.07*  --      Estimated Creatinine Clearance: 56.7 mL/min (A) (by C-G formula based on SCr of 1.07 mg/dL (H)).   Medical History: Past Medical History:  Diagnosis Date   AICD (automatic cardioverter/defibrillator) present    Anemia    Anxiety    AV block, complete (HCC)    Cervical radiculopathy    CHF (congestive heart failure) (HCC)    Chronic systolic heart failure (HCC)    GERD (gastroesophageal reflux disease)    History of cardiac arrest    Hypercholesteremia    Hypertension    Pancreatic cancer (HCC)    stage 4   Pre-diabetes    Presence of permanent cardiac pacemaker 02/03/2015   [facility]  with AICD   Right arm weakness     Medications:  Medications Prior to Admission  Medication Sig Dispense Refill Last Dose   acetaminophen (TYLENOL) 500 MG tablet Take 1,000 mg by mouth every 6 (six) hours as needed for mild pain or moderate pain.   Past Week at prn   amLODipine (NORVASC) 5 MG tablet Take 1 tablet (5 mg total) by mouth daily. 30 tablet 1 05/04/2021   apixaban (ELIQUIS) 5 MG TABS tablet Take 5 mg by mouth 2 (two) times daily.   05/04/2021 at 1000   atorvastatin (LIPITOR) 80 MG tablet TAKE 1 TABLET BY MOUTH  DAILY 90 tablet 3 05/04/2021   butalbital-acetaminophen-caffeine (FIORICET) 50-325-40 MG tablet TAKE ONE TABLET BY MOUTH EVERY 6 HOURS AS NEEDED FOR HEADACHE 30 tablet 5 prn at prn   carvedilol (COREG) 6.25 MG tablet Take 6.25 mg by  mouth 2 (two) times daily with a meal.   05/04/2021 at 1000   clopidogrel (PLAVIX) 75 MG tablet Take 75 mg by mouth daily.   05/04/2021 at 1000   cyanocobalamin 1000 MCG tablet Take 1,000 mcg by mouth daily.    05/04/2021 at 1000   dexamethasone (DECADRON) 4 MG tablet Take 2 tablets (8 mg total) by mouth 2 (two) times daily. (Patient taking differently: Take 4 mg by mouth 3 (three) times daily.) 120 tablet 0 05/04/2021 at 1200   ezetimibe (ZETIA) 10 MG tablet Take 10 mg by mouth daily.   05/04/2021 at 1000   Ferrous Sulfate Dried 200 (65 Fe) MG TABS Take 1 tablet by mouth daily.   05/04/2021 at 1000   fluconazole (DIFLUCAN) 100 MG tablet Take 1 tablet (100 mg total) by mouth daily. 10 tablet 0 05/04/2021   furosemide (LASIX) 20 MG tablet TAKE 1 TABLET BY MOUTH  DAILY AS NEEDED 90 tablet 1 prn at prn   gabapentin (NEURONTIN) 100 MG capsule TAKE 2 CAPSULES BY MOUTH AT BEDTIME 180 capsule 1 Past Week at 2200   loratadine (CLARITIN) 10 MG tablet Take 10 mg by mouth daily.    05/04/2021 at 1000   pantoprazole (PROTONIX) 40 MG tablet TAKE  1 TABLET BY MOUTH IN  THE MORNING 90 tablet 3 05/04/2021 at 1000   potassium chloride (KLOR-CON) 10 MEQ tablet TAKE 1 TABLET BY MOUTH  TWICE DAILY 180 tablet 1 05/04/2021 at 1000   traMADol (ULTRAM) 50 MG tablet 1/2 to 1 pill every 6-8 hours for pain. 30 tablet 0 Past Week at prn   diphenhydrAMINE (BENADRYL) 25 mg capsule Take 25 mg by mouth See admin instructions. Take 25 mg by mouth 1 hour prior to procedure involving contrast   prn   enoxaparin (LOVENOX) 100 MG/ML injection Inject 1 mL (100 mg total) into the skin every 12 (twelve) hours. 4 mL 0    nitroGLYCERIN (NITROSTAT) 0.4 MG SL tablet Place 1 tablet (0.4 mg total) under the tongue every 5 (five) minutes as needed for chest pain. 30 tablet 3 prn   promethazine (PHENERGAN) 25 MG tablet Take 1/2-1 tablet by mouth every 8 hours as needed for nausea. 30 tablet 1 prn at prn    Assessment: Pharmacy consulted to transition from heparin  to enoxaparin in this 75 year old female admitted with a new DVT in spite of apparent compliance with apixaban prior to arrival.  Pt was on Eliquis 5 mg PO BID at home,  last dose on 8/3 AM. Baseline labs notable for thrombocytopenia. Vascular surgery is recommending an IVC.  Goal of Therapy:  Monitor platelets by anticoagulation protocol: Yes  Plan:  Stop heparin infusion Start enoxaparin 1 mg/kg subcutaneously twice daily Monitor renal function, CBC   Dallie Piles 05/05/2021,9:01 AM

## 2021-05-05 NOTE — Care Management (Signed)
    Durable Medical Equipment  (From admission, onward)           Start     Ordered   05/05/21 1443  For home use only DME Hospital bed  Once       Question Answer Comment  Length of Need Lifetime   Patient has (list medical condition): Pancreatic cancer metastatic to liver and brain, CHF   The above medical condition requires: Patient requires the ability to reposition frequently   Head must be elevated greater than: 45 degrees   Bed type Semi-electric   Hoyer Lift Yes   Support Surface: Gel Overlay      05/05/21 1443

## 2021-05-06 ENCOUNTER — Telehealth: Payer: Self-pay | Admitting: *Deleted

## 2021-05-06 ENCOUNTER — Encounter: Payer: Self-pay | Admitting: Vascular Surgery

## 2021-05-06 ENCOUNTER — Ambulatory Visit: Payer: Medicare Other

## 2021-05-06 DIAGNOSIS — Z8673 Personal history of transient ischemic attack (TIA), and cerebral infarction without residual deficits: Secondary | ICD-10-CM

## 2021-05-06 DIAGNOSIS — I82442 Acute embolism and thrombosis of left tibial vein: Principal | ICD-10-CM

## 2021-05-06 DIAGNOSIS — C7931 Secondary malignant neoplasm of brain: Secondary | ICD-10-CM | POA: Diagnosis not present

## 2021-05-06 DIAGNOSIS — K869 Disease of pancreas, unspecified: Secondary | ICD-10-CM

## 2021-05-06 DIAGNOSIS — D696 Thrombocytopenia, unspecified: Secondary | ICD-10-CM | POA: Diagnosis not present

## 2021-05-06 DIAGNOSIS — Z7902 Long term (current) use of antithrombotics/antiplatelets: Secondary | ICD-10-CM

## 2021-05-06 DIAGNOSIS — C787 Secondary malignant neoplasm of liver and intrahepatic bile duct: Secondary | ICD-10-CM | POA: Diagnosis not present

## 2021-05-06 LAB — BASIC METABOLIC PANEL
Anion gap: 6 (ref 5–15)
BUN: 45 mg/dL — ABNORMAL HIGH (ref 8–23)
CO2: 23 mmol/L (ref 22–32)
Calcium: 8.1 mg/dL — ABNORMAL LOW (ref 8.9–10.3)
Chloride: 107 mmol/L (ref 98–111)
Creatinine, Ser: 1.14 mg/dL — ABNORMAL HIGH (ref 0.44–1.00)
GFR, Estimated: 50 mL/min — ABNORMAL LOW (ref >60)
Glucose, Bld: 136 mg/dL — ABNORMAL HIGH (ref 70–99)
Potassium: 5.1 mmol/L (ref 3.5–5.1)
Sodium: 136 mmol/L (ref 135–145)

## 2021-05-06 LAB — CBC
HCT: 32.5 % — ABNORMAL LOW (ref 36.0–46.0)
Hemoglobin: 10.5 g/dL — ABNORMAL LOW (ref 12.0–15.0)
MCH: 27.3 pg (ref 26.0–34.0)
MCHC: 32.3 g/dL (ref 30.0–36.0)
MCV: 84.4 fL (ref 80.0–100.0)
Platelets: 33 10*3/uL — ABNORMAL LOW (ref 150–400)
RBC: 3.85 MIL/uL — ABNORMAL LOW (ref 3.87–5.11)
RDW: 16.4 % — ABNORMAL HIGH (ref 11.5–15.5)
WBC: 9.9 10*3/uL (ref 4.0–10.5)
nRBC: 0 % (ref 0.0–0.2)

## 2021-05-06 LAB — MAGNESIUM: Magnesium: 2.3 mg/dL (ref 1.7–2.4)

## 2021-05-06 MED ORDER — ENOXAPARIN SODIUM 100 MG/ML IJ SOSY: 100.0000 mg | PREFILLED_SYRINGE | Freq: Two times a day (BID) | INTRAMUSCULAR | 2 refills | Status: AC

## 2021-05-06 MED ORDER — ENOXAPARIN SODIUM 120 MG/0.8ML IJ SOSY: 1.0000 mg/kg | PREFILLED_SYRINGE | Freq: Two times a day (BID) | INTRAMUSCULAR | 2 refills | Status: DC

## 2021-05-06 MED ORDER — CLOPIDOGREL BISULFATE 75 MG PO TABS: ORAL_TABLET | ORAL | Status: AC

## 2021-05-06 MED ORDER — ENSURE ENLIVE PO LIQD
237.0000 mL | Freq: Two times a day (BID) | ORAL | Status: DC
  Administered 2021-05-06: 237 mL via ORAL

## 2021-05-06 MED ORDER — DEXAMETHASONE 4 MG PO TABS: 4.0000 mg | ORAL_TABLET | Freq: Three times a day (TID) | ORAL | 0 refills | Status: AC

## 2021-05-06 MED ORDER — CARVEDILOL 6.25 MG PO TABS: ORAL_TABLET | ORAL | Status: AC

## 2021-05-06 NOTE — Progress Notes (Signed)
Patient discharged to home with home care.  Palliative visited patient prior to discharge.  PIV removed and pressure held 30 mins.  No bleeding to site, pressure dressing placed due to oozing previously.  Site clean, dry intact on departure.  Discharge instructions given to daughter.

## 2021-05-06 NOTE — Discharge Summary (Signed)
Physician Discharge Summary   Anna Marsh  female DOB: 1946/07/03  [id number]  PCP: Jerrol Banana., MD  Admit date: 05/04/2021 Discharge date: 05/06/2021  Admitted From: home Disposition:  home Home Health: Yes CODE STATUS: Full code  Discharge Instructions     Discharge instructions   Complete by: As directed    Since you developed blood clots while on Eliquis, Dr. Janese Banks is switching your blood thinner to Lovenox injection.  You also received an IVC filter per Dr. Elroy Channel recommendation.  Hold Lovenox as instructed by Dr. Janese Banks prior to your upcoming liver biopsy.   Dr. Enzo Bi [facility] Course:  For full details, please see H&P, progress notes, consult notes and ancillary notes.  Briefly,  Anna Marsh is a 75 y.o. 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     She and daughter reported compliance with her anticoagulation and did not miss any doses.  She denied chest pain or shortness of breath.     DVT left lower extremity, with history of PE, on Eliquis --started on heparin infusion --discussed case with Dr. Janese Banks --received IVC filter  --discharged on treatment-dose lovenox, per Dr. Elroy Channel rec.  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   --Prior to discharge, Dr. Janese Banks and onc palliative care discussed with pt about her overall poor prognosis and poor functional status that pt likely would not tolerate chemo which would also be of questionable benefit.  Onc also discussed with pt's daughter.     Leukocytosis --likely related to prednisone therapy for brain mets  Thrombocytopenia - Secondary  to chemotherapy -Monitor for bleeding in view of Heparin infusion   Essential hypertension - Continue amlodipine --hold home coreg due to bradycardia in 50's without it.     Chronic systolic CHF s/p AICD --hold home coreg due to bradycardia     History of CVA (cerebrovascular accident) - Continue atorvastatin  --Hold plavix for upcoming liver biopsy   Thrush --cont home fluconazole   Discharge Diagnoses:  Active Problems:   Hypertension   Chronic systolic CHF (congestive heart failure), NYHA class 3 (HCC)   Paroxysmal atrial fibrillation (HCC)   Liver metastases (HCC)   History of CVA (cerebrovascular accident)   DVT (deep venous thrombosis) (HCC)   Leukocytosis   30 Day Unplanned Readmission Risk Score    Flowsheet Row ED to Hosp-Admission (Current) from 05/04/2021 in [facility]  30 Day Unplanned Readmission Risk Score (%) 23.73 Filed at 05/06/2021 0400       This score is the patient's risk of an unplanned readmission within 30 days of being discharged (0 -100%). The score is based on dignosis, age, lab data, medications, orders, and past utilization.   Low:  0-14.9   Medium: 15-21.9   High: 22-29.9   Extreme: 30 and above         Discharge Instructions:  Allergies as of 05/06/2021       Reactions   Iodinated Diagnostic Agents Other (See Comments)   Causes migraines   Iodine    Contrast Dye causes migraines   Lisinopril Cough   Penicillins Rash  Has patient had a PCN reaction causing immediate rash, facial/tongue/throat swelling, SOB or lightheadedness with hypotension: Yes Has patient had a PCN reaction causing severe rash involving mucus membranes or skin necrosis: No Has patient had a PCN reaction that required hospitalization: No Has patient had a PCN reaction occurring within the last 10 years: Yes If all of the above answers are "NO", then may proceed with Cephalosporin use.        Medication List     STOP  taking these medications    apixaban 5 MG Tabs tablet Commonly known as: ELIQUIS       TAKE these medications    acetaminophen 500 MG tablet Commonly known as: TYLENOL Take 1,000 mg by mouth every 6 (six) hours as needed for mild pain or moderate pain.   amLODipine 5 MG tablet Commonly known as: NORVASC Take 1 tablet (5 mg total) by mouth daily.   atorvastatin 80 MG tablet Commonly known as: LIPITOR TAKE 1 TABLET BY MOUTH  DAILY   butalbital-acetaminophen-caffeine 50-325-40 MG tablet Commonly known as: FIORICET TAKE ONE TABLET BY MOUTH EVERY 6 HOURS AS NEEDED FOR HEADACHE   carvedilol 6.25 MG tablet Commonly known as: COREG Hold until followup with outpatient provider because your heart rate has been in 50's even without this medication. What changed:  how much to take how to take this when to take this additional instructions   clopidogrel 75 MG tablet Commonly known as: PLAVIX Hold until your oncologist says it's ok to resume (due to upcoming liver biopsy). What changed:  how much to take how to take this when to take this additional instructions   cyanocobalamin 1000 MCG tablet Take 1,000 mcg by mouth daily.   dexamethasone 4 MG tablet Commonly known as: DECADRON Take 1 tablet (4 mg total) by mouth 3 (three) times daily.   diphenhydrAMINE 25 mg capsule Commonly known as: BENADRYL Take 25 mg by mouth See admin instructions. Take 25 mg by mouth 1 hour prior to procedure involving contrast   enoxaparin 100 MG/ML injection Commonly known as: Lovenox Inject 1 mL (100 mg total) into the skin every 12 (twelve) hours.   ezetimibe 10 MG tablet Commonly known as: ZETIA Take 10 mg by mouth daily.   Ferrous Sulfate Dried 200 (65 Fe) MG Tabs Take 1 tablet by mouth daily.   fluconazole 100 MG tablet Commonly known as: DIFLUCAN Take 1 tablet (100 mg total) by mouth daily.   furosemide 20 MG tablet Commonly known as: LASIX TAKE 1 TABLET BY MOUTH  DAILY AS  NEEDED   gabapentin 100 MG capsule Commonly known as: NEURONTIN TAKE 2 CAPSULES BY MOUTH AT BEDTIME   loratadine 10 MG tablet Commonly known as: CLARITIN Take 10 mg by mouth daily.   nitroGLYCERIN 0.4 MG SL tablet Commonly known as: NITROSTAT Place 1 tablet (0.4 mg total) under the tongue every 5 (five) minutes as needed for chest pain.   pantoprazole 40 MG tablet Commonly known as: PROTONIX TAKE 1 TABLET BY MOUTH IN  THE MORNING   potassium chloride 10 MEQ tablet Commonly known as: KLOR-CON TAKE 1 TABLET BY MOUTH  TWICE DAILY   promethazine 25 MG tablet Commonly known as: PHENERGAN Take 1/2-1 tablet by mouth every 8 hours as needed for nausea.   traMADol 50 MG tablet Commonly known as: ULTRAM 1/2 to 1 pill every 6-8 hours for pain.               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 Go on 05/26/2021.   Specialties: Vascular Surgery, Cardiology, Radiology, Vascular Surgery Why: 11am appointment Contact information: [address] [place] [address] [id number]         Sindy Guadeloupe, MD. Go on 05/12/2021.   Specialty: Oncology Why: 2:05pm appointment Contact information: [place] [address] [phone]         Jerrol Banana., MD Follow up in 1 week(s).   Specialty: Family Medicine Contact information: [address] [place]  [address] [phone]                 Allergies  Allergen Reactions   Iodinated Diagnostic Agents Other (See Comments)    Causes migraines   Iodine     Contrast Dye causes migraines   Lisinopril Cough   Penicillins Rash    Has patient had a PCN reaction causing immediate rash, facial/tongue/throat swelling, SOB or lightheadedness with hypotension: Yes Has patient had a PCN reaction causing severe rash involving mucus membranes or skin necrosis: No Has patient had a PCN reaction that required hospitalization: No Has patient had a PCN reaction occurring within the last 10 years: Yes If all of the above answers are "NO", then may proceed with Cephalosporin use.     The results of significant diagnostics from this hospitalization (including imaging, microbiology, ancillary and laboratory) are listed below for reference.   Consultations:   Procedures/Studies: CT Head Wo Contrast  Result Date: 04/17/2021 CLINICAL DATA:  Mental status change EXAM: CT HEAD WITHOUT CONTRAST TECHNIQUE: Contiguous axial images were obtained from the base of the skull through the vertex without intravenous contrast. COMPARISON:  CT brain 07/04/2018 FINDINGS: Brain: No hemorrhage is visualized. Hypodensity/edema within the left cerebellum and right posterior frontal subcortical white matter. No midline shift. Stable ventricle size. Vascular: No hyperdense vessels. Scattered carotid vascular calcification Skull: Normal. Negative for fracture or focal lesion. Sinuses/Orbits: No acute finding. Other: None IMPRESSION: 1. Focal hypodensities/areas of edema within the right frontal lobe white matter and the left cerebellum, favored to represent neoplasm/metastatic disease over infarcts, given findings on recent body imaging. Negative for acute intracranial hemorrhage. Electronically Signed   By: Donavan Foil M.D.   On: 04/17/2021 16:33   CT CHEST ABDOMEN PELVIS W CONTRAST  Result Date: 04/13/2021 CLINICAL DATA:   75 year old female with history of multiple liver lesions noted on prior outside CT examination concerning for metastatic disease to the liver. Follow-up study. EXAM: CT CHEST, ABDOMEN, AND PELVIS WITH CONTRAST TECHNIQUE: Multidetector CT imaging of the chest, abdomen and pelvis was performed following the standard protocol during bolus administration of intravenous contrast. CONTRAST:  162m OMNIPAQUE IOHEXOL 350 MG/ML SOLN COMPARISON:  CT of the abdomen and pelvis 08/14/2014. No prior chest  CT. FINDINGS: CT CHEST FINDINGS Cardiovascular: Heart size is normal. There is no significant pericardial fluid, thickening or pericardial calcification. There is aortic atherosclerosis, as well as atherosclerosis of the great vessels of the mediastinum and the coronary arteries, including calcified atherosclerotic plaque in the left anterior descending and right coronary arteries. Calcifications of the mitral annulus. Left-sided biventricular pacemaker device in place with lead tips terminating in the right atrium, right ventricular apex and overlying the lateral wall the left ventricle via the coronary sinus and coronary veins. Mediastinum/Nodes: No pathologically enlarged mediastinal or hilar lymph nodes. Esophagus is unremarkable in appearance. No axillary lymphadenopathy. Lungs/Pleura: Elevation of the right hemidiaphragm. Some associated passive subsegmental atelectasis in the basal segments of the right lower lobe. No acute consolidative airspace disease. No pleural effusions. No definite suspicious appearing pulmonary nodules or masses are noted. Musculoskeletal: There are no aggressive appearing lytic or blastic lesions noted in the visualized portions of the skeleton. CT ABDOMEN PELVIS FINDINGS Hepatobiliary: Multiple hypovascular lesions are scattered throughout the hepatic parenchyma, likely reflective of widespread metastatic disease. The largest of these is in the left lobe of the liver straddling segments 2, 3,  4A and 4B (axial image 47 of series 2) measuring 5.2 x 3.7 cm. No intra or extrahepatic biliary ductal dilatation. Numerous tiny calcified gallstones lying dependently in the gallbladder. Gallbladder is not distended. No gallbladder wall thickening or pericholecystic fluid or surrounding inflammatory changes. Pancreas: In the tail of the pancreas there is a large hypovascular mass measuring 5.0 x 3.6 x 3.6 cm (coronal image 77 of series 6 and axial image 51 of series 2). Some curvilinear calcifications are noted along the posterior and superior wall of this lesion. This lesion makes contact with the undersurface of the proximal stomach with some obscuration of the intervening fat plane (best appreciated on coronal image 78 of series 6). This lesion is well separated from the superior mesenteric artery and vein as well as the portal vein. This lesion is in close proximity to the splenic artery and splenic vein. Splenic vein appears markedly narrowed related to the lesion, but appears grossly patent at this time. No pancreatic ductal dilatation. No peripancreatic fluid collections or inflammatory changes. Spleen: Unremarkable. Adrenals/Urinary Tract: Multiple low-attenuation lesions in both kidneys, compatible with simple cysts, largest of which measures 12.0 x 8.5 cm in the upper pole of the left kidney. No hydroureteronephrosis. Urinary bladder is nearly decompressed, but otherwise unremarkable in appearance. Stomach/Bowel: The appearance of the stomach is normal. No pathologic dilatation of small bowel or colon. Normal appendix. Vascular/Lymphatic: Aortic atherosclerosis, without evidence of aneurysm or dissection in the abdominal or pelvic vasculature. No lymphadenopathy noted in the abdomen or pelvis. Reproductive: Status post hysterectomy.  Ovaries are atrophic. Other: No significant volume of ascites.  No pneumoperitoneum. Musculoskeletal: There are no aggressive appearing lytic or blastic lesions noted in the  visualized portions of the skeleton. IMPRESSION: 1. Large hypovascular mass in the tail of the pancreas concerning for primary pancreatic malignancy. This is intimately associated with the undersurface of the proximal stomach without definite direct invasion at this time. This is also intimately associated with the splenic artery and vein, and there are numerous hypovascular hepatic lesions concerning for widespread metastatic disease to the liver. Further evaluation with endoscopic ultrasound and consideration for biopsy is strongly recommended. 2. No definitive findings to suggest metastatic disease to the thorax. 3. No evidence of metastatic disease in the pelvis. 4. Cholelithiasis without evidence of acute cholecystitis at this time. 5. Aortic  atherosclerosis, in addition to 2 vessel coronary artery disease. Assessment for potential risk factor modification, dietary therapy or pharmacologic therapy may be warranted, if clinically indicated. 6. There are calcifications of the mitral annulus. Echocardiographic correlation for evaluation of potential valvular dysfunction may be warranted if clinically indicated. 7. Additional incidental findings, as above. Electronically Signed   By: Vinnie Langton M.D.   On: 04/13/2021 11:29   PERIPHERAL VASCULAR CATHETERIZATION  Result Date: 05/05/2021 See surgical note for result.  US Venous Img Lower Unilateral Left  Result Date: 05/04/2021 CLINICAL DATA:  Left lower extremity pain and edema. EXAM: LEFT LOWER EXTREMITY VENOUS DOPPLER ULTRASOUND TECHNIQUE: Gray-scale sonography with graded compression, as well as color Doppler and duplex ultrasound were performed to evaluate the lower extremity deep venous systems from the level of the common femoral vein and including the common femoral, femoral, profunda femoral, popliteal and calf veins including the posterior tibial, peroneal and gastrocnemius veins when visible. The superficial great saphenous vein was also  interrogated. Spectral Doppler was utilized to evaluate flow at rest and with distal augmentation maneuvers in the common femoral, femoral and popliteal veins. COMPARISON:  None. FINDINGS: Contralateral Common Femoral Vein: Respiratory phasicity is normal and symmetric with the symptomatic side. No evidence of thrombus. Normal compressibility. Common Femoral Vein: No evidence of thrombus. Normal compressibility, respiratory phasicity and response to augmentation. Saphenofemoral Junction: No evidence of thrombus. Normal compressibility and flow on color Doppler imaging. Profunda Femoral Vein: No evidence of thrombus. Normal compressibility and flow on color Doppler imaging. Femoral Vein: No evidence of thrombus. Normal compressibility, respiratory phasicity and response to augmentation. Popliteal Vein: No evidence of thrombus. Normal compressibility, respiratory phasicity and response to augmentation. Calf Veins: There is evidence of thrombus in the more posterior of paired posterior tibial veins. Anteriorly oriented posterior tibial vein is normally patent. Superficial Great Saphenous Vein: Superficial thrombophlebitis is noted involving much of the visualized great saphenous vein. Thrombus does not extend to the saphenofemoral junction. Venous Reflux:  None. Other Findings:  No abnormal fluid collections. IMPRESSION: 1. Deep vein thrombus in posterior segment of paired posterior tibial veins. 2. Superficial thrombophlebitis of the left great saphenous vein. Electronically Signed   By: Aletta Edouard M.D.   On: 05/04/2021 16:57   DG Chest Port 1 View  Result Date: 04/17/2021 CLINICAL DATA:  Weakness.  Generalized abdominal pain. EXAM: PORTABLE CHEST 1 VIEW COMPARISON:  Radiograph 12/05/2016.  CT 04/13/2021 FINDINGS: Left-sided pacemaker in place. Normal heart size with stable mediastinal contours aortic atherosclerosis. Elevated right hemidiaphragm with adjacent compressive atelectasis. No acute airspace  disease. No pulmonary edema or pleural effusion. No acute osseous abnormalities are seen. Presumed external artifact projects over the left upper thorax. IMPRESSION: No acute chest finding. Elevated right hemidiaphragm with adjacent compressive atelectasis. Electronically Signed   By: Keith Rake M.D.   On: 04/17/2021 16:15      Labs: BNP (last 3 results) No results for input(s): BNP in the last 8760 hours. Basic Metabolic Panel: Recent Labs  Lab 05/04/21 1511 05/06/21 0510  NA 135 136  K 5.1 5.1  CL 104 107  CO2 24 23  GLUCOSE 125* 136*  BUN 43* 45*  CREATININE 1.07* 1.14*  CALCIUM 8.6* 8.1*  MG  --  2.3   Liver Function Tests: No results for input(s): AST, ALT, ALKPHOS, BILITOT, PROT, ALBUMIN in the last 168 hours. No results for input(s): LIPASE, AMYLASE in the last 168 hours. No results for input(s): AMMONIA in the last 168 hours. CBC:  Recent Labs  Lab 05/04/21 1511 05/06/21 0510  WBC 19.1* 9.9  NEUTROABS 17.4*  --   HGB 12.1 10.5*  HCT 36.3 32.5*  MCV 83.3 84.4  PLT 44* 33*   Cardiac Enzymes: No results for input(s): CKTOTAL, CKMB, CKMBINDEX, TROPONINI in the last 168 hours. BNP: Invalid input(s): POCBNP CBG: No results for input(s): GLUCAP in the last 168 hours. D-Dimer No results for input(s): DDIMER in the last 72 hours. Hgb A1c No results for input(s): HGBA1C in the last 72 hours. Lipid Profile No results for input(s): CHOL, HDL, LDLCALC, TRIG, CHOLHDL, LDLDIRECT in the last 72 hours. Thyroid function studies No results for input(s): TSH, T4TOTAL, T3FREE, THYROIDAB in the last 72 hours.  Invalid input(s): FREET3 Anemia work up No results for input(s): VITAMINB12, FOLATE, FERRITIN, TIBC, IRON, RETICCTPCT in the last 72 hours. Urinalysis    Component Value Date/Time   COLORURINE AMBER (A) 04/17/2021 1246   APPEARANCEUR TURBID (A) 04/17/2021 1246   LABSPEC 1.025 04/17/2021 1246   PHURINE 5.0 04/17/2021 1246   GLUCOSEU NEGATIVE 04/17/2021 1246    HGBUR NEGATIVE 04/17/2021 1246   [place] 04/17/2021 1246   KETONESUR NEGATIVE 04/17/2021 1246   PROTEINUR 30 (A) 04/17/2021 1246   NITRITE NEGATIVE 04/17/2021 1246   LEUKOCYTESUR SMALL (A) 04/17/2021 1246   Sepsis Labs Invalid input(s): PROCALCITONIN,  WBC,  LACTICIDVEN Microbiology Recent Results (from the past 240 hour(s))  Resp Panel by RT-PCR (Flu A&B, Covid) Nasopharyngeal Swab     Status: None   Collection Time: 05/05/21 12:49 AM   Specimen: Nasopharyngeal Swab; Nasopharyngeal(NP) swabs in vial transport medium  Result Value Ref Range Status   SARS Coronavirus 2 by RT PCR NEGATIVE NEGATIVE Final    Comment: (NOTE) SARS-CoV-2 target nucleic acids are NOT DETECTED.  The SARS-CoV-2 RNA is generally detectable in upper respiratory specimens during the acute phase of infection. The lowest concentration of SARS-CoV-2 viral copies this assay can detect is 138 copies/mL. A negative result does not preclude SARS-Cov-2 infection and should not be used as the sole basis for treatment or other patient management decisions. A negative result may occur with  improper specimen collection/handling, submission of specimen other than nasopharyngeal swab, presence of viral mutation(s) within the areas targeted by this assay, and inadequate number of viral copies(<138 copies/mL). A negative result must be combined with clinical observations, patient history, and epidemiological information. The expected result is Negative.  Fact Sheet for Patients:  EntrepreneurPulse.com.au  Fact Sheet for Healthcare Providers:  IncredibleEmployment.be  This test is no t yet approved or cleared by the [place] FDA and  has been authorized for detection and/or diagnosis of SARS-CoV-2 by FDA under an Emergency Use Authorization (EUA). This EUA will remain  in effect (meaning this test can be used) for the duration of the COVID-19 declaration under  Section 564(b)(1) of the Act, 21 U.S.C.section 360bbb-3(b)(1), unless the authorization is terminated  or revoked sooner.       Influenza A by PCR NEGATIVE NEGATIVE Final   Influenza B by PCR NEGATIVE NEGATIVE Final    Comment: (NOTE) The Xpert Xpress SARS-CoV-2/FLU/RSV plus assay is intended as an aid in the diagnosis of influenza from Nasopharyngeal swab specimens and should not be used as a sole basis for treatment. Nasal washings and aspirates are unacceptable for Xpert Xpress SARS-CoV-2/FLU/RSV testing.  Fact Sheet for Patients: EntrepreneurPulse.com.au  Fact Sheet for Healthcare Providers: IncredibleEmployment.be  This test is not yet approved or cleared by the [place] FDA and has been  authorized for detection and/or diagnosis of SARS-CoV-2 by FDA under an Emergency Use Authorization (EUA). This EUA will remain in effect (meaning this test can be used) for the duration of the COVID-19 declaration under Section 564(b)(1) of the Act, 21 U.S.C. section 360bbb-3(b)(1), unless the authorization is terminated or revoked.  Performed at [facility], [address] [place], [place] [address]      Total time spend on discharging this patient, including the last patient exam, discussing the hospital stay, instructions for ongoing care as it relates to all pertinent caregivers, as well as preparing the medical discharge records, prescriptions, and/or referrals as applicable, is 45 minutes.    Enzo Bi, MD  [facility] 05/06/2021, 9:57 AM

## 2021-05-06 NOTE — Consult Note (Signed)
Hematology/Oncology Consult note [facility] [phone] [phone]  Patient Care Team: Jerrol Banana., MD as PCP - General (Family Medicine)   Name of the patient: Anna Marsh  [id number]  1946/01/17    Reason for consult: Pancreatic mass with liver metastases and brain metastases   Requesting physician: Dr. Enzo Bi  Date of visit: 05/06/2021    History of presenting illness-patient is a 75 year old female who was seen by me after incidental findings of liver metastases noted on CT angiogram on 04/03/2021.  She was noted to have a PE at that time and was started on Eliquis.  No DVT noted on 04/08/2021.  Subsequent work-up and CT abdomen revealed pancreatic mass with liver metastases.  Plan was to get an outpatient ultrasound-guided liver biopsy.  In the interim patient was admitted for symptoms of confusion and was found to have multiple brain metastases and is currently undergoing palliative whole brain radiation treatment.  Her biopsy is planned for next week.  She does have a history of prior stroke and is on Plavix which needs to be held for 5 days prior to biopsy and my plan was to bridge her to Lovenox before the biopsy.  Patient now presented to the hospital with symptoms of worsening left lower extremity swelling and was noted to have deep vein thrombosis of the posterior tibial veins which was not seen prior.She has now been switched from heparin drip to Lovenox.  I also requested vascular surgery to see the patient for possible IVC filter which was inserted yesterday.  ECOG PS- 3  Pain scale- 0   Review of systems- Review of Systems  Constitutional:  Positive for malaise/fatigue. Negative for chills, fever and weight loss.  HENT:  Negative for congestion, ear discharge and nosebleeds.   Eyes:  Negative for blurred vision.  Respiratory:  Negative for cough, hemoptysis, sputum production, shortness of breath and wheezing.    Cardiovascular:  Positive for leg swelling. Negative for chest pain, palpitations, orthopnea and claudication.  Gastrointestinal:  Negative for abdominal pain, blood in stool, constipation, diarrhea, heartburn, melena, nausea and vomiting.  Genitourinary:  Negative for dysuria, flank pain, frequency, hematuria and urgency.  Musculoskeletal:  Negative for back pain, joint pain and myalgias.  Skin:  Negative for rash.  Neurological:  Negative for dizziness, tingling, focal weakness, seizures, weakness and headaches.  Endo/Heme/Allergies:  Does not bruise/bleed easily.  Psychiatric/Behavioral:  Negative for depression and suicidal ideas. The patient does not have insomnia.    Allergies  Allergen Reactions   Iodinated Diagnostic Agents Other (See Comments)    Causes migraines   Iodine     Contrast Dye causes migraines   Lisinopril Cough   Penicillins Rash    Has patient had a PCN reaction causing immediate rash, facial/tongue/throat swelling, SOB or lightheadedness with hypotension: Yes Has patient had a PCN reaction causing severe rash involving mucus membranes or skin necrosis: No Has patient had a PCN reaction that required hospitalization: No Has patient had a PCN reaction occurring within the last 10 years: Yes If all of the above answers are "NO", then may proceed with Cephalosporin use.    Patient Active Problem List   Diagnosis Date Noted   History of CVA (cerebrovascular accident) 05/05/2021   DVT (deep venous thrombosis) ([place]) 05/05/2021   Leukocytosis 05/05/2021   Palliative care encounter    Syncope 04/18/2021   Liver metastases ([place])    Brain metastases ([place]) 04/17/2021   Pancreatic mass 04/17/2021  Altered mental status 04/17/2021   Paroxysmal atrial fibrillation ([place]) 07/26/2018   Right cavernous carotid stenosis 07/16/2018   Ischemic stroke ([place]) 07/16/2018   TIA (transient ischemic attack) 11/07/2016   Pre-diabetes 07/20/2015   Acute anxiety 07/20/2015    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   GERD (gastroesophageal reflux disease) 02/28/2015   Hypercholesterolemia 02/28/2015   Hypertension 02/28/2015   Cardiac defibrillator in place 02/28/2015   Block, bundle branch, left 02/28/2015   Adiposity 02/28/2015   Acid reflux 02/28/2015   Complete atrioventricular block (HCC) 81/85/6314   Chronic systolic CHF (congestive heart failure), NYHA class 3 ([place]) 02/24/2015   H/O cardiac catheterization 02/24/2015   Combined fat and carbohydrate induced hyperlipemia 02/15/2015   Cardiomyopathy ([place]) 02/01/2015   Angina pectoris ([place]) 12/25/2013   Arthritis 12/25/2013   Hallux abducto valgus 12/25/2013     Past Medical History:  Diagnosis Date   AICD (automatic cardioverter/defibrillator) present    Anemia    Anxiety    AV block, complete (HCC)    Cervical radiculopathy    CHF (congestive heart failure) (HCC)    Chronic systolic heart failure (HCC)    GERD (gastroesophageal reflux disease)    History of cardiac arrest    Hypercholesteremia    Hypertension    Pancreatic cancer ([place])    stage 4   Pre-diabetes    Presence of permanent cardiac pacemaker 02/03/2015   [facility]  with AICD   Right arm weakness      Past Surgical History:  Procedure Laterality Date   ABDOMINAL HYSTERECTOMY     CATARACT EXTRACTION W/PHACO Left 12/18/2016   Procedure: CATARACT EXTRACTION PHACO AND INTRAOCULAR LENS PLACEMENT ([place])  left;  Surgeon: Ronnell Freshwater, MD;  Location: [facility];  Service: Ophthalmology;  Laterality: Left;   CATARACT EXTRACTION W/PHACO Right 01/01/2017   Procedure: CATARACT EXTRACTION PHACO AND INTRAOCULAR LENS PLACEMENT ([place])  Right;  Surgeon: Ronnell Freshwater, MD;  Location: [place];  Service: Ophthalmology;  Laterality: Right;   HERNIA REPAIR     umbilical x 2   IVC FILTER INSERTION N/A 05/05/2021   Procedure: IVC FILTER INSERTION;  Surgeon: Katha Cabal, MD;  Location: [place] [facility];  Service: Cardiovascular;  Laterality: N/A;   NECK SURGERY     PACEMAKER IMPLANT  02/03/2015   [facility]   TONSILLECTOMY      Social History   Socioeconomic History   Marital status: Widowed    Spouse name: Not on file   Number of children: Not on file   Years of education: Not on file   Highest education level: Not on file  Occupational History   Not on file  Tobacco Use   Smoking status: Former    Packs/day: 0.50    Years: 20.00    Pack years: 10.00    Types: Cigarettes    Quit date: 10/03/2003    Years since quitting: 17.6   Smokeless tobacco: Never  Vaping Use   Vaping Use: Never used  Substance and Sexual Activity   Alcohol use: No   Drug use: No   Sexual activity: Never  Other Topics Concern   Not on file  Social History Narrative   Not on file   Social Determinants of Health   Financial Resource Strain: Not on file  Food Insecurity: Not on file  Transportation Needs: Not on file  Physical Activity: Not on file  Stress: Not on file  Social Connections: Not on file  Intimate Partner  Violence: Not on file     Family History  Problem Relation Age of Onset   Cancer Mother    Hyperlipidemia Father    Heart disease Father    Vascular Disease Father    Arthritis Sister    Colon cancer Sister    Migraines Brother    Alcohol abuse Sister    Dementia Sister    Bipolar disorder Sister    Heart attack Sister        around age 39   Arthritis Sister    Migraines Sister    Breast cancer Neg Hx      Current Facility-Administered Medications:    acetaminophen (TYLENOL) tablet 650 mg, 650 mg, Oral, Q6H PRN **OR** acetaminophen (TYLENOL) suppository 650 mg, 650 mg, Rectal, Q6H PRN, Schnier, Dolores Lory, MD   amLODipine (NORVASC) tablet 5 mg, 5 mg, Oral, Daily, Schnier, Dolores Lory, MD, 5 mg at 05/06/21 1021   atorvastatin (LIPITOR) tablet 80 mg, 80 mg, Oral, Daily, Schnier, Dolores Lory, MD, 80 mg at 05/06/21 1021   dexamethasone (DECADRON) tablet 4 mg, 4 mg, Oral,  TID, Schnier, Dolores Lory, MD, 4 mg at 05/06/21 1021   enoxaparin (LOVENOX) injection 120 mg, 1 mg/kg, Subcutaneous, Q12H, Schnier, Dolores Lory, MD, 120 mg at 05/06/21 1020   feeding supplement (ENSURE ENLIVE / ENSURE PLUS) liquid 237 mL, 237 mL, Oral, BID BM, Enzo Bi, MD, 237 mL at 05/06/21 1021   fluconazole (DIFLUCAN) tablet 100 mg, 100 mg, Oral, Daily, Schnier, Dolores Lory, MD, 100 mg at 05/06/21 1021   gabapentin (NEURONTIN) capsule 200 mg, 200 mg, Oral, QHS, Schnier, Dolores Lory, MD, 200 mg at 05/05/21 2100   HYDROcodone-acetaminophen (NORCO/VICODIN) 5-325 MG per tablet 1-2 tablet, 1-2 tablet, Oral, Q4H PRN, Schnier, Dolores Lory, MD   HYDROmorphone (DILAUDID) injection 1 mg, 1 mg, Intravenous, Once PRN, Schnier, Dolores Lory, MD   liver oil-zinc oxide (DESITIN) 40 % ointment, , Topical, PRN, Schnier, Dolores Lory, MD, Given at 05/05/21 2103   morphine 2 MG/ML injection 2 mg, 2 mg, Intravenous, Q2H PRN, Schnier, Dolores Lory, MD   ondansetron (ZOFRAN) tablet 4 mg, 4 mg, Oral, Q6H PRN **OR** ondansetron (ZOFRAN) injection 4 mg, 4 mg, Intravenous, Q6H PRN, Schnier, Dolores Lory, MD   ondansetron (ZOFRAN) injection 4 mg, 4 mg, Intravenous, Q6H PRN, Schnier, Dolores Lory, MD   pantoprazole (PROTONIX) EC tablet 40 mg, 40 mg, Oral, q morning, Schnier, Dolores Lory, MD, 40 mg at 05/06/21 1022   Physical exam:  Vitals:   05/05/21 1700 05/05/21 1927 05/06/21 0450 05/06/21 1000  BP: (!) 149/44 (!) 124/44 (!) 161/39 (!) 160/50  Pulse: (!) 51 62 (!) 55 60  Resp: 18 17 16 18   Temp:  98.7 F (37.1 C) 98 F (36.7 C) 98 F (36.7 C)  TempSrc:  Oral Oral   SpO2: 100% 99% 98% 98%  Weight:      Height:       Physical Exam Constitutional:      General: She is not in acute distress. Cardiovascular:     Rate and Rhythm: Normal rate and regular rhythm.     Heart sounds: Normal heart sounds.  Pulmonary:     Effort: Pulmonary effort is normal.     Breath sounds: Normal breath sounds.  Abdominal:     General: Bowel sounds  are normal.     Palpations: Abdomen is soft.  Musculoskeletal:     Right lower leg: Edema present.     Left lower leg: Edema present.  Skin:    General: Skin is warm and dry.  Neurological:     Mental Status: She is alert and oriented to person, place, and time.       CMP Latest Ref Rng & Units 05/06/2021  Glucose 70 - 99 mg/dL 136(H)  BUN 8 - 23 mg/dL 45(H)  Creatinine 0.44 - 1.00 mg/dL 1.14(H)  Sodium 135 - 145 mmol/L 136  Potassium 3.5 - 5.1 mmol/L 5.1  Chloride 98 - 111 mmol/L 107  CO2 22 - 32 mmol/L 23  Calcium 8.9 - 10.3 mg/dL 8.1(L)  Total Protein 6.5 - 8.1 g/dL -  Total Bilirubin 0.3 - 1.2 mg/dL -  Alkaline Phos 38 - 126 U/L -  AST 15 - 41 U/L -  ALT 0 - 44 U/L -   CBC Latest Ref Rng & Units 05/06/2021  WBC 4.0 - 10.5 K/uL 9.9  Hemoglobin 12.0 - 15.0 g/dL 10.5(L)  Hematocrit 36.0 - 46.0 % 32.5(L)  Platelets 150 - 400 K/uL 33(L)    @IMAGES @  CT Head Wo Contrast  Result Date: 04/17/2021 CLINICAL DATA:  Mental status change EXAM: CT HEAD WITHOUT CONTRAST TECHNIQUE: Contiguous axial images were obtained from the base of the skull through the vertex without intravenous contrast. COMPARISON:  CT brain 07/04/2018 FINDINGS: Brain: No hemorrhage is visualized. Hypodensity/edema within the left cerebellum and right posterior frontal subcortical white matter. No midline shift. Stable ventricle size. Vascular: No hyperdense vessels. Scattered carotid vascular calcification Skull: Normal. Negative for fracture or focal lesion. Sinuses/Orbits: No acute finding. Other: None IMPRESSION: 1. Focal hypodensities/areas of edema within the right frontal lobe white matter and the left cerebellum, favored to represent neoplasm/metastatic disease over infarcts, given findings on recent body imaging. Negative for acute intracranial hemorrhage. Electronically Signed   By: Donavan Foil M.D.   On: 04/17/2021 16:33   CT CHEST ABDOMEN PELVIS W CONTRAST  Result Date: 04/13/2021 CLINICAL DATA:   75 year old female with history of multiple liver lesions noted on prior outside CT examination concerning for metastatic disease to the liver. Follow-up study. EXAM: CT CHEST, ABDOMEN, AND PELVIS WITH CONTRAST TECHNIQUE: Multidetector CT imaging of the chest, abdomen and pelvis was performed following the standard protocol during bolus administration of intravenous contrast. CONTRAST:  149m OMNIPAQUE IOHEXOL 350 MG/ML SOLN COMPARISON:  CT of the abdomen and pelvis 08/14/2014. No prior chest CT. FINDINGS: CT CHEST FINDINGS Cardiovascular: Heart size is normal. There is no significant pericardial fluid, thickening or pericardial calcification. There is aortic atherosclerosis, as well as atherosclerosis of the great vessels of the mediastinum and the coronary arteries, including calcified atherosclerotic plaque in the left anterior descending and right coronary arteries. Calcifications of the mitral annulus. Left-sided biventricular pacemaker device in place with lead tips terminating in the right atrium, right ventricular apex and overlying the lateral wall the left ventricle via the coronary sinus and coronary veins. Mediastinum/Nodes: No pathologically enlarged mediastinal or hilar lymph nodes. Esophagus is unremarkable in appearance. No axillary lymphadenopathy. Lungs/Pleura: Elevation of the right hemidiaphragm. Some associated passive subsegmental atelectasis in the basal segments of the right lower lobe. No acute consolidative airspace disease. No pleural effusions. No definite suspicious appearing pulmonary nodules or masses are noted. Musculoskeletal: There are no aggressive appearing lytic or blastic lesions noted in the visualized portions of the skeleton. CT ABDOMEN PELVIS FINDINGS Hepatobiliary: Multiple hypovascular lesions are scattered throughout the hepatic parenchyma, likely reflective of widespread metastatic disease. The largest of these is in the left lobe of the liver straddling segments 2,  3,  4A and 4B (axial image 47 of series 2) measuring 5.2 x 3.7 cm. No intra or extrahepatic biliary ductal dilatation. Numerous tiny calcified gallstones lying dependently in the gallbladder. Gallbladder is not distended. No gallbladder wall thickening or pericholecystic fluid or surrounding inflammatory changes. Pancreas: In the tail of the pancreas there is a large hypovascular mass measuring 5.0 x 3.6 x 3.6 cm (coronal image 77 of series 6 and axial image 51 of series 2). Some curvilinear calcifications are noted along the posterior and superior wall of this lesion. This lesion makes contact with the undersurface of the proximal stomach with some obscuration of the intervening fat plane (best appreciated on coronal image 78 of series 6). This lesion is well separated from the superior mesenteric artery and vein as well as the portal vein. This lesion is in close proximity to the splenic artery and splenic vein. Splenic vein appears markedly narrowed related to the lesion, but appears grossly patent at this time. No pancreatic ductal dilatation. No peripancreatic fluid collections or inflammatory changes. Spleen: Unremarkable. Adrenals/Urinary Tract: Multiple low-attenuation lesions in both kidneys, compatible with simple cysts, largest of which measures 12.0 x 8.5 cm in the upper pole of the left kidney. No hydroureteronephrosis. Urinary bladder is nearly decompressed, but otherwise unremarkable in appearance. Stomach/Bowel: The appearance of the stomach is normal. No pathologic dilatation of small bowel or colon. Normal appendix. Vascular/Lymphatic: Aortic atherosclerosis, without evidence of aneurysm or dissection in the abdominal or pelvic vasculature. No lymphadenopathy noted in the abdomen or pelvis. Reproductive: Status post hysterectomy.  Ovaries are atrophic. Other: No significant volume of ascites.  No pneumoperitoneum. Musculoskeletal: There are no aggressive appearing lytic or blastic lesions noted in the  visualized portions of the skeleton. IMPRESSION: 1. Large hypovascular mass in the tail of the pancreas concerning for primary pancreatic malignancy. This is intimately associated with the undersurface of the proximal stomach without definite direct invasion at this time. This is also intimately associated with the splenic artery and vein, and there are numerous hypovascular hepatic lesions concerning for widespread metastatic disease to the liver. Further evaluation with endoscopic ultrasound and consideration for biopsy is strongly recommended. 2. No definitive findings to suggest metastatic disease to the thorax. 3. No evidence of metastatic disease in the pelvis. 4. Cholelithiasis without evidence of acute cholecystitis at this time. 5. Aortic atherosclerosis, in addition to 2 vessel coronary artery disease. Assessment for potential risk factor modification, dietary therapy or pharmacologic therapy may be warranted, if clinically indicated. 6. There are calcifications of the mitral annulus. Echocardiographic correlation for evaluation of potential valvular dysfunction may be warranted if clinically indicated. 7. Additional incidental findings, as above. Electronically Signed   By: Vinnie Langton M.D.   On: 04/13/2021 11:29   PERIPHERAL VASCULAR CATHETERIZATION  Result Date: 05/05/2021 See surgical note for result.  US Venous Img Lower Unilateral Left  Result Date: 05/04/2021 CLINICAL DATA:  Left lower extremity pain and edema. EXAM: LEFT LOWER EXTREMITY VENOUS DOPPLER ULTRASOUND TECHNIQUE: Gray-scale sonography with graded compression, as well as color Doppler and duplex ultrasound were performed to evaluate the lower extremity deep venous systems from the level of the common femoral vein and including the common femoral, femoral, profunda femoral, popliteal and calf veins including the posterior tibial, peroneal and gastrocnemius veins when visible. The superficial great saphenous vein was also  interrogated. Spectral Doppler was utilized to evaluate flow at rest and with distal augmentation maneuvers in the common femoral, femoral and popliteal veins. COMPARISON:  None.  FINDINGS: Contralateral Common Femoral Vein: Respiratory phasicity is normal and symmetric with the symptomatic side. No evidence of thrombus. Normal compressibility. Common Femoral Vein: No evidence of thrombus. Normal compressibility, respiratory phasicity and response to augmentation. Saphenofemoral Junction: No evidence of thrombus. Normal compressibility and flow on color Doppler imaging. Profunda Femoral Vein: No evidence of thrombus. Normal compressibility and flow on color Doppler imaging. Femoral Vein: No evidence of thrombus. Normal compressibility, respiratory phasicity and response to augmentation. Popliteal Vein: No evidence of thrombus. Normal compressibility, respiratory phasicity and response to augmentation. Calf Veins: There is evidence of thrombus in the more posterior of paired posterior tibial veins. Anteriorly oriented posterior tibial vein is normally patent. Superficial Great Saphenous Vein: Superficial thrombophlebitis is noted involving much of the visualized great saphenous vein. Thrombus does not extend to the saphenofemoral junction. Venous Reflux:  None. Other Findings:  No abnormal fluid collections. IMPRESSION: 1. Deep vein thrombus in posterior segment of paired posterior tibial veins. 2. Superficial thrombophlebitis of the left great saphenous vein. Electronically Signed   By: Aletta Edouard M.D.   On: 05/04/2021 16:57   DG Chest Port 1 View  Result Date: 04/17/2021 CLINICAL DATA:  Weakness.  Generalized abdominal pain. EXAM: PORTABLE CHEST 1 VIEW COMPARISON:  Radiograph 12/05/2016.  CT 04/13/2021 FINDINGS: Left-sided pacemaker in place. Normal heart size with stable mediastinal contours aortic atherosclerosis. Elevated right hemidiaphragm with adjacent compressive atelectasis. No acute airspace  disease. No pulmonary edema or pleural effusion. No acute osseous abnormalities are seen. Presumed external artifact projects over the left upper thorax. IMPRESSION: No acute chest finding. Elevated right hemidiaphragm with adjacent compressive atelectasis. Electronically Signed   By: Keith Rake M.D.   On: 04/17/2021 16:15    Assessment and plan- Patient is a 75 y.o. female with pancreatic mass and liver and brain metastases concerning for metastatic pancreatic cancer  Patient has a significantly elevated CA 19-9 level of 07622.  This along with the findings of primary pancreatic mass liver and brain metastases is highly suspicious for metastatic pancreatic adenocarcinoma.  I met with the patient at bedside today.  We discussed that overall patient's performance status has declined over the last 1 month even as we await her biopsy.  She feels short of breath even while walking a few steps to the bathroom.  Hospital bed was delivered to her house yesterday.  She will be completing whole brain radiation treatment next week.  I explained to the patient that even if we get the biopsy and confirmed that this is pancreatic cancer I doubt that she would be a candidate for any systemic chemotherapy at this time given her poor performance status.  We discussed that chemotherapy would likely add a few months but not years to her life expectancy and would come at the cost of significant side effects such as nausea vomiting low blood counts risk of hospitalizations or infections.  It would be reasonable to consider home hospice at this time.  We do not need biopsy confirmation to proceed with hospice.  Although liver biopsy would confirm her diagnosis I doubt it would change her ultimate management.  She has had 3 hospitalizations recently for PE and then brain metastases and now her second DVT on anticoagulation.  All this portends an overall poor prognosis.  My team has reached out to patient's daughter [name] as  well and they will contemplate these things over the weekend and let us know if they would wish to proceed with biopsy next week or forego biopsy  and proceed with home hospice    Visit Diagnosis 1. Acute deep vein thrombosis (DVT) of tibial vein of left lower extremity (HCC)   2. Thrombocytopenia ([place])     Dr. Randa Evens, MD, MPH [facility] at [facility] [phone] 05/06/2021 5:15 PM

## 2021-05-06 NOTE — Telephone Encounter (Signed)
I called Anna Marsh the daughter of Ms. Anna Marsh and talk to her about the conversation that Dr. Janese Banks has had with her earlier today.  Dr. Janese Banks felt that she is weaker than she was from the last time that she had saw her.  She told her that she wants her to take the weekend to make a determination whether or not she wants to follow through with the biopsy.  She feels that in her current status of health that even if the biopsy was done and showed cancer that the patient would be to week to tolerate the chemotherapy.  She wants her to finish up the last week of her radiation.  And have her daughter Anna Marsh or herself to give [place] a call on Monday and see whether or not they want to go forward with the biopsy or would just go ahead and do hospice services.  Patient and daughter in the car together and that is exactly what they Then the patient said that she told her daughter and she did not miss any of the information Dr. Janese Banks had said and they will contemplate what they want to do and call us on Monday

## 2021-05-06 NOTE — Progress Notes (Signed)
[facility] Daily Progress Note  05/05/21: 1.   Ultrasound guidance for vascular access to the right common femoral vein 2.   Catheter placement into the inferior vena cava 3.   Inferior venacavogram 4.   Placement of a [place] IVC filter  Subjective: Patient without complaint. No acute issues overnight.   Objective: Vitals:   05/05/21 1700 05/05/21 1927 05/06/21 0450 05/06/21 1000  BP: (!) 149/44 (!) 124/44 (!) 161/39 (!) 160/50  Pulse: (!) 51 62 (!) 55 60  Resp: '18 17 16 18  '$ Temp:  98.7 F (37.1 C) 98 F (36.7 C) 98 F (36.7 C)  TempSrc:  Oral Oral   SpO2: 100% 99% 98% 98%  Weight:      Height:        Intake/Output Summary (Last 24 hours) at 05/06/2021 1243 Last data filed at 05/06/2021 1028 Gross per 24 hour  Intake 480 ml  Output --  Net 480 ml   Physical Exam: A&Ox3, NAD CV: RRR Pulmonary: CTA Bilaterally Abdomen: Soft, Nontender, Nondistended Right Groin:  Access Site: clean and dry. No swelling or drainage.  Vascular:  Bilateral: warm distally to toes   Laboratory: CBC    Component Value Date/Time   WBC 9.9 05/06/2021 0510   HGB 10.5 (L) 05/06/2021 0510   HGB 10.0 (L) 04/07/2021 0945   HCT 32.5 (L) 05/06/2021 0510   HCT 30.5 (L) 04/07/2021 0945   PLT 33 (L) 05/06/2021 0510   PLT 222 04/07/2021 0945   BMET    Component Value Date/Time   NA 136 05/06/2021 0510   NA 142 04/07/2021 0945   NA 140 09/29/2014 1128   K 5.1 05/06/2021 0510   K 3.6 09/29/2014 1128   CL 107 05/06/2021 0510   CL 103 09/29/2014 1128   CO2 23 05/06/2021 0510   CO2 32 09/29/2014 1128   GLUCOSE 136 (H) 05/06/2021 0510   GLUCOSE 84 09/29/2014 1128   BUN 45 (H) 05/06/2021 0510   BUN 14 04/07/2021 0945   BUN 16 09/29/2014 1128   CREATININE 1.14 (H) 05/06/2021 0510   CREATININE 1.10 09/29/2014 1128   CALCIUM 8.1 (L) 05/06/2021 0510   CALCIUM 9.2 09/29/2014 1128   GFRNONAA 50 (L) 05/06/2021 0510   GFRNONAA 52 (L) 09/29/2014 1128   GFRAA 58 (L) 08/18/2020  0910   GFRAA >60 09/29/2014 1128   Assessment/Planning:  1) successful placement of IVC filter.  2) Will see patient in about two months in clinic. 3) no further recommendations at this time 4) Vascular to sign off  Discussed with Dr. Eber Hong [facility] PA-C 05/06/2021 12:43 PM

## 2021-05-06 NOTE — Evaluation (Signed)
Physical Therapy Evaluation Patient Details Name: Anna Marsh MRN: [id number] DOB: 29-Jan-1946 Today's Date: 05/06/2021   History of Present Illness  Pt admitted to [facility] on 05/04/21 for c/o LLE edema and tenderness. Imaging revealed DVT posterior segment of paired posterior tibial veins, superifical thrombophlebitis of L great saphenous vein. Pt s/p IVC filter on 8/4. Significant PMH includes: pancreatic CA metastatic to liver and brain undergoing brain radiation, CHF s/p AICD, HTN, hx CVA, hx PE (on Eliquis).   Clinical Impression  Pt is a 75 year old F admitted to hospital on 05/04/21 for LLE DVT. At baseline, pt required assist for ADL's/IADL's and used RW for limited household gait. Pt presents with LE weakness, decreased activity tolerance, and decreased standing balance resulting in impaired functional mobility. Due to deficits, pt required mod I for bed mobility, CGA for transfers, and CGA for gait with RW. Decreased activity tolerance noted by labored breathing with minimal mobility, although pt with RPE of 2-3/10 indicating "light activity." Deficits limit the pt's ability to safely and independently perform ADL's, transfer, and ambulate. Pt will benefit from acute skilled PT services to address deficits for return to baseline function. At this time, PT recommends DC home with HHPT and intermittent supervision; pt agreeable.     Follow Up Recommendations Home health PT;Supervision - Intermittent    Equipment Recommendations  None recommended by PT       Precautions / Restrictions Precautions Precautions: Fall Restrictions Weight Bearing Restrictions: No      Mobility  Bed Mobility Overal bed mobility: Modified Independent             General bed mobility comments: Increased time/effort with use of BUE on handrail    Transfers Overall transfer level: Needs assistance Equipment used: Rolling walker (2 wheeled) Transfers: Sit to/from Stand Sit to Stand: Min guard          General transfer comment: CGA for safety to stand from EOB with RW; verbal cues for hand placement and sequencing. Increased time/effort to achieve full upright standing  Ambulation/Gait Ambulation/Gait assistance: Min guard Gait Distance (Feet): 32 Feet (88f x1, 697fx1) Assistive device: Rolling walker (2 wheeled)       General Gait Details: CGA for safety to ambulate with RW, demonstrating decreased step length/foot clearance bil, slowed cadence, and decreased RW proximity.     Balance Overall balance assessment: Modified Independent;Mild deficits observed, not formally tested Sitting-balance support: No upper extremity supported;Feet supported   Sitting balance - Comments: Normal   Standing balance support: During functional activity;Bilateral upper extremity supported   Standing balance comment: Fair with BUE support             Pertinent Vitals/Pain Pain Assessment: No/denies pain    Home Living Family/patient expects to be discharged to:: Private residence Living Arrangements: Children Available Help at Discharge: Family Type of Home: House Home Access: Stairs to enter (has level entry into home, which she will be using) Entrance Stairs-Rails: Can reach both;Left;Right Entrance Stairs-Number of Steps: 5 Home Layout: Multi-level Home Equipment: Walker - 2 wheels;Cane - single point;Wheelchair - manual;Shower seat;Bedside commode;Hospital bed      Prior Function Level of Independence: Needs assistance   Gait / Transfers Assistance Needed: Pt reports being mod I for short distance household gait with RW and uses WC for community gait  ADL's / Homemaking Assistance Needed: Daughter assists with all ADL's (set up assist for dressing) and IADL's  Comments: No falls in the past year  Hand Dominance   Dominant Hand: Right    Extremity/Trunk Assessment   Upper Extremity Assessment Upper Extremity Assessment: Overall WFL for tasks assessed (sensation  intact)    Lower Extremity Assessment Lower Extremity Assessment: Generalized weakness (sensation and coordination intact)    Cervical / Trunk Assessment Cervical / Trunk Assessment: Normal  Communication   Communication: No difficulties  Cognition Arousal/Alertness: Awake/alert Behavior During Therapy: WFL for tasks assessed/performed Overall Cognitive Status: Within Functional Limits for tasks assessed          General Comments: A&Ox4, able to follow 100% of 2-step commands      General Comments General comments (skin integrity, edema, etc.): +1 non-pitting edema in BLE    Exercises Other Exercises Other Exercises: bed mobility, transfers, and gait with RW Other Exercises: Pt educated regarding: PT role/POC, DC recommendations, energy conservation with RW, safety with mobility, and activity modification. She verbalized understanding   Assessment/Plan    PT Assessment Patient needs continued PT services  PT Problem List Decreased strength;Decreased activity tolerance;Decreased balance;Decreased mobility       PT Treatment Interventions DME instruction;Therapeutic activities;Gait training;Therapeutic exercise;Patient/family education;Balance training;Functional mobility training;Neuromuscular re-education    PT Goals (Current goals can be found in the Care Plan section)  Acute Rehab PT Goals Patient Stated Goal: to go home PT Goal Formulation: With patient Time For Goal Achievement: 05/20/21 Potential to Achieve Goals: Good    Frequency Min 2X/week    AM-PAC PT "6 Clicks" Mobility  Outcome Measure Help needed turning from your back to your side while in a flat bed without using bedrails?: A Little Help needed moving from lying on your back to sitting on the side of a flat bed without using bedrails?: A Little Help needed moving to and from a bed to a chair (including a wheelchair)?: A Little Help needed standing up from a chair using your arms (e.g., wheelchair or  bedside chair)?: A Little Help needed to walk in hospital room?: A Little Help needed climbing 3-5 steps with a railing? : A Lot 6 Click Score: 17    End of Session Equipment Utilized During Treatment: Gait belt Activity Tolerance: Patient tolerated treatment well Patient left:  (assisted pt into transport WC for transport to radiation therapy) Nurse Communication: Mobility status PT Visit Diagnosis: Unsteadiness on feet (R26.81);Other abnormalities of gait and mobility (R26.89);Muscle weakness (generalized) (M62.81)    Time: [id number] PT Time Calculation (min) (ACUTE ONLY): 14 min   Charges:   PT Evaluation $PT Eval Low Complexity: 1 Low         Herminio Commons, PT, DPT 11:39 AM,05/06/21

## 2021-05-06 NOTE — TOC Progression Note (Addendum)
Transition of Care [facility]) - Progression Note    Patient Details  Name: Anna Marsh MRN: [id number] Date of Birth: [birth year]  [facility]) CM/SW [place], LCSW Phone Number: 05/06/2021, 8:32 AM  Clinical Narrative:        Reached out to [facility] with [place] who confirmed they have accepted patient for home health services. Patient to discharge home today.    Expected Discharge Plan: [place] Barriers to Discharge: Continued Medical Work up  Expected Discharge Plan and Services Expected Discharge Plan: [place] arrangements for the past 2 months: Single Family Home Expected Discharge Date: 05/06/21               DME Arranged: Hospital bed DME Agency: [employer] Date DME Agency Contacted: 05/05/21   Representative spoke with at DME Agency: [place]: PT, OT [place] Agency: [place] ([place]) Date [place]: 05/05/21   Representative spoke with at [place]: [place] ([place]) Interventions    Readmission Risk Interventions No flowsheet data found.

## 2021-05-09 ENCOUNTER — Inpatient Hospital Stay: Payer: Medicare Other | Attending: Oncology | Admitting: Hospice and Palliative Medicine

## 2021-05-09 ENCOUNTER — Other Ambulatory Visit: Payer: Self-pay

## 2021-05-09 ENCOUNTER — Telehealth: Payer: Self-pay | Admitting: *Deleted

## 2021-05-09 ENCOUNTER — Ambulatory Visit: Payer: Medicare Other

## 2021-05-09 DIAGNOSIS — Z515 Encounter for palliative care: Secondary | ICD-10-CM | POA: Diagnosis not present

## 2021-05-09 DIAGNOSIS — C787 Secondary malignant neoplasm of liver and intrahepatic bile duct: Secondary | ICD-10-CM | POA: Insufficient documentation

## 2021-05-09 DIAGNOSIS — C259 Malignant neoplasm of pancreas, unspecified: Secondary | ICD-10-CM | POA: Insufficient documentation

## 2021-05-09 MED ORDER — FLUCONAZOLE 100 MG PO TABS: 100.0000 mg | ORAL_TABLET | Freq: Every day | ORAL | 0 refills | Status: AC

## 2021-05-09 MED ORDER — MAGIC MOUTHWASH W/LIDOCAINE: 5.0000 mL | Freq: Four times a day (QID) | ORAL | 0 refills | Status: AC | PRN

## 2021-05-09 MED ORDER — AMLODIPINE BESYLATE 5 MG PO TABS: 5.0000 mg | ORAL_TABLET | Freq: Every day | ORAL | 1 refills | Status: AC

## 2021-05-09 NOTE — Telephone Encounter (Signed)
Daughter [name] called today.  She is there at home with her mother.  The patient and her daughter agrees that she is even gotten weaker since she left the hospital on Friday and last week.  The patient has decided that she does not want chemo and therefore does not need a biopsy.  They are still trying to get her in the car to come over further last week of radiation.  They are open to hospice services and I am going to talk to [facility] the palliative care about speaking to them a day early.  Patient had an appointment on 8/10.  Once I get in touch with Merrily Pew I will let the patient and family know.  I have called Pamala Hurry in specials and let her know that we are canceling the biopsy

## 2021-05-09 NOTE — Progress Notes (Signed)
Virtual Visit via Video Note  I connected with Anna Marsh on 05/09/21 at  2:00 PM EDT by a video enabled telemedicine application and verified that I am speaking with the correct person using two identifiers.  Location: Patient: Home Provider: Clinic    I discussed the limitations of evaluation and management by telemedicine and the availability of in person appointments. The patient expressed understanding and agreed to proceed.  History of Present Illness: Anna Marsh is a 75 y.o. female with multiple medical problems including hypertension, hyperlipidemia, GERD, history of TIAs, recent PE, who was found to have presumed pancreatic cancer with liver metastasis on recent CT.  Work-up is ongoing.  She was hospitalized 04/17/2021 to 719/22 with altered mental status.  CT of the head revealed focal hypodensities/edema in the right frontal lobe and left cerebellum concerning for metastatic disease.  Patient was readmitted 05/05/2021 to 05/06/2021 with recurrent left lower extremity DVT considered a failure of Eliquis.  Patient underwent IVC filter placement and was rotated to Lovenox.     Observations/Objective: I spoke with patient and daughter by video.  Unfortunately, patient has had progressive weakness and is now essentially chair to bed.  She is unable to stand without assistance.  Patient is requiring significant assistance from family with daily care.  Patient verbalized her decision to forego further work-up and treatment.  She is not interested in finishing XRT.  Instead, she wants to stay home and focus on comfort and quality of life.  She is in agreement with hospice involvement. I called in an order for hospice to [facility].   Symptomatically, patient endorses occasional ankle pain likely from her edema.  She is keeping her legs elevated.  Patient also endorses thrush secondary to oral steroids.  She is currently weaning off the dexamethasone.  Patient has been treated on oral  fluconazole but is still having residual symptoms.  Assessment and Plan: Probable metastatic pancreatic cancer -patient has decided to stop pursuing treatment and would like hospice involvement at home.  Referral sent for hospice.  Oral thrush -continue oral fluconazole.  Will send Rx for Magic mouthwash.  DVT with recent PE -on Lovenox.  Spoke with pharmacist and will see if patient can do a vacation override to increase quantity of Lovenox prior to enrollment in hospice  Hypertension -amlodipine refilled per family request  Follow Up Instructions: As needed   I discussed the assessment and treatment plan with the patient. The patient was provided an opportunity to ask questions and all were answered. The patient agreed with the plan and demonstrated an understanding of the instructions.   The patient was advised to call back or seek an in-person evaluation if the symptoms worsen or if the condition fails to improve as anticipated.  I provided 20 minutes of non-face-to-face time during this encounter.   Irean Hong, NP

## 2021-05-10 ENCOUNTER — Ambulatory Visit: Payer: Medicare Other

## 2021-05-10 ENCOUNTER — Inpatient Hospital Stay: Payer: Medicare Other | Admitting: Family Medicine

## 2021-05-10 ENCOUNTER — Ambulatory Visit
Admission: RE | Admit: 2021-05-10 | Discharge: 2021-05-10 | Disposition: A | Discharge status: Home / Self Care | Payer: Medicare Other | Source: Ambulatory Visit | Attending: Oncology | Admitting: Oncology

## 2021-05-10 NOTE — Progress Notes (Deleted)
Established patient visit   Patient: Anna Marsh   DOB: 1945/11/21   75 y.o. Female  MRN: [id number] Visit Date: 05/10/2021  Today's healthcare provider: Wilhemena Durie, MD   No chief complaint on file.  Subjective    HPI  Follow up Hospitalization  Patient was admitted to [facility] on 05/05/2021 and discharged on 05/06/2021. She was treated for acute DVT. Treatment for this included labs were stable.  She reports good compliance with treatment. She reports this condition is {resolved/improved/worsened:23923}.    {Show patient history (optional):23778}   Medications: Outpatient Medications Prior to Visit  Medication Sig   acetaminophen (TYLENOL) 500 MG tablet Take 1,000 mg by mouth every 6 (six) hours as needed for mild pain or moderate pain.   amLODipine (NORVASC) 5 MG tablet Take 1 tablet (5 mg total) by mouth daily.   atorvastatin (LIPITOR) 80 MG tablet TAKE 1 TABLET BY MOUTH  DAILY   butalbital-acetaminophen-caffeine (FIORICET) 50-325-40 MG tablet TAKE ONE TABLET BY MOUTH EVERY 6 HOURS AS NEEDED FOR HEADACHE   carvedilol (COREG) 6.25 MG tablet Hold until followup with outpatient provider because your heart rate has been in 50's even without this medication.   clopidogrel (PLAVIX) 75 MG tablet Hold until your oncologist says it's ok to resume (due to upcoming liver biopsy).   cyanocobalamin 1000 MCG tablet Take 1,000 mcg by mouth daily.    dexamethasone (DECADRON) 4 MG tablet Take 1 tablet (4 mg total) by mouth 3 (three) times daily.   diphenhydrAMINE (BENADRYL) 25 mg capsule Take 25 mg by mouth See admin instructions. Take 25 mg by mouth 1 hour prior to procedure involving contrast   enoxaparin (LOVENOX) 100 MG/ML injection Inject 1 mL (100 mg total) into the skin every 12 (twelve) hours.   ezetimibe (ZETIA) 10 MG tablet Take 10 mg by mouth daily.   Ferrous Sulfate Dried 200 (65 Fe) MG TABS Take 1 tablet by mouth daily.   fluconazole (DIFLUCAN) 100 MG tablet Take  1 tablet (100 mg total) by mouth daily.   furosemide (LASIX) 20 MG tablet TAKE 1 TABLET BY MOUTH  DAILY AS NEEDED   gabapentin (NEURONTIN) 100 MG capsule TAKE 2 CAPSULES BY MOUTH AT BEDTIME   loratadine (CLARITIN) 10 MG tablet Take 10 mg by mouth daily.    magic mouthwash w/lidocaine SOLN Take 5 mLs by mouth 4 (four) times daily as needed for mouth pain. Swish and spit   nitroGLYCERIN (NITROSTAT) 0.4 MG SL tablet Place 1 tablet (0.4 mg total) under the tongue every 5 (five) minutes as needed for chest pain.   pantoprazole (PROTONIX) 40 MG tablet TAKE 1 TABLET BY MOUTH IN  THE MORNING   potassium chloride (KLOR-CON) 10 MEQ tablet TAKE 1 TABLET BY MOUTH  TWICE DAILY   promethazine (PHENERGAN) 25 MG tablet Take 1/2-1 tablet by mouth every 8 hours as needed for nausea.   traMADol (ULTRAM) 50 MG tablet 1/2 to 1 pill every 6-8 hours for pain.   No facility-administered medications prior to visit.    Review of Systems  {Labs  Heme  Chem  Endocrine  Serology  Results Review (optional):23779}   Objective    There were no vitals taken for this visit. {Show previous vital signs (optional):23777}   Physical Exam  ***  No results found for any visits on 05/10/21.  Assessment & Plan     ***  No follow-ups on file.      {provider attestation***:1}   Miguel Aschoff  Brooke Bonito, [place] [phone] (phone) [phone] (fax)  [place]

## 2021-05-11 ENCOUNTER — Inpatient Hospital Stay: Payer: Medicare Other | Admitting: Hospice and Palliative Medicine

## 2021-05-11 ENCOUNTER — Ambulatory Visit: Payer: Medicare Other

## 2021-05-12 ENCOUNTER — Inpatient Hospital Stay: Payer: Medicare Other | Admitting: Oncology

## 2021-05-12 ENCOUNTER — Ambulatory Visit: Payer: Medicare Other

## 2021-05-12 MED ORDER — EZETIMIBE 10 MG TABLET
ORAL_TABLET | 3 refills | 0 days | Status: CP
Start: 2021-05-12 — End: ?

## 2021-05-13 ENCOUNTER — Ambulatory Visit: Payer: Medicare Other

## 2021-05-16 ENCOUNTER — Ambulatory Visit: Payer: Medicare Other

## 2021-05-23 ENCOUNTER — Other Ambulatory Visit: Payer: Self-pay | Admitting: Family Medicine

## 2021-05-23 NOTE — Telephone Encounter (Signed)
  Requested medication (s) are on the active medication list:yes  Last refill:  03/22/2021  Future visit scheduled: no  Notes to clinic:  Failed protocol:  Cr in normal range and within 360 days  Requested Prescriptions  Pending Prescriptions Disp Refills   potassium chloride (KLOR-CON) 10 MEQ tablet [Pharmacy Med Name: Potassium Chloride Crys ER 10 MEQ Oral Tablet Extended Release] 180 tablet 3    Sig: TAKE 1 TABLET BY MOUTH  TWICE DAILY     Endocrinology:  Minerals - Potassium Supplementation Failed - 05/23/2021  6:31 AM      Failed - Cr in normal range and within 360 days    Creatinine  Date Value Ref Range Status  09/29/2014 1.10 0.60 - 1.30 mg/dL Final   Creatinine, Ser  Date Value Ref Range Status  05/06/2021 1.14 (H) 0.44 - 1.00 mg/dL Final          Passed - K in normal range and within 360 days    Potassium  Date Value Ref Range Status  05/06/2021 5.1 3.5 - 5.1 mmol/L Final  09/29/2014 3.6 3.5 - 5.1 mmol/L Final          Passed - Valid encounter within last 12 months    Recent Outpatient Visits           1 month ago Liver mass   [facility] Jerrol Banana., MD   5 months ago Ischemic cardiomyopathy   [facility] Jerrol Banana., MD   8 months ago Need for immunization against influenza   [facility] Jerrol Banana., MD   11 months ago Medicare annual wellness visit, subsequent   [facility] Jerrol Banana., MD   1 year ago Essential hypertension   [facility] Jerrol Banana., MD

## 2021-05-24 ENCOUNTER — Other Ambulatory Visit: Payer: Self-pay | Admitting: Hospice and Palliative Medicine

## 2021-05-24 MED ORDER — OXYCODONE HCL 5 MG PO TABS: 5.0000 mg | ORAL_TABLET | ORAL | 0 refills | Status: AC | PRN

## 2021-05-24 NOTE — Progress Notes (Signed)
Received message from patient's hospice nurse.  Patient has been having worse abdominal pain unrelieved by tramadol.  Patient is also declining with progressive weakness and poor oral intake.  She is still able to take tablets.  We will start her on oxycodone 5 to 10 mg every 4 hours as needed for pain.  Nurse will make another visit later this week to assess.

## 2021-05-26 ENCOUNTER — Ambulatory Visit (INDEPENDENT_AMBULATORY_CARE_PROVIDER_SITE_OTHER): Payer: Self-pay | Admitting: Vascular Surgery

## 2021-05-30 ENCOUNTER — Telehealth: Payer: Self-pay | Admitting: *Deleted

## 2021-06-02 NOTE — Telephone Encounter (Signed)
Kendra with Hospice called reporting that patient expired this morning at 8:47 AM

## 2021-06-02 DEATH — deceased

## 2021-06-07 ENCOUNTER — Ambulatory Visit: Payer: Self-pay | Admitting: Family Medicine

## 2021-06-15 ENCOUNTER — Ambulatory Visit: Payer: Medicare Other | Admitting: Radiation Oncology

## 2022-03-10 IMAGING — US US EXTREM LOW VENOUS*L*
1 series · 13 of 24 positions shown · non-contrast
Comparison: None.

CLINICAL DATA: Left lower extremity pain and edema.



[Series 1: us venous img lower uni left (dvt) · portal-venous · 13 of 38 slices shown]
[im 1/38]
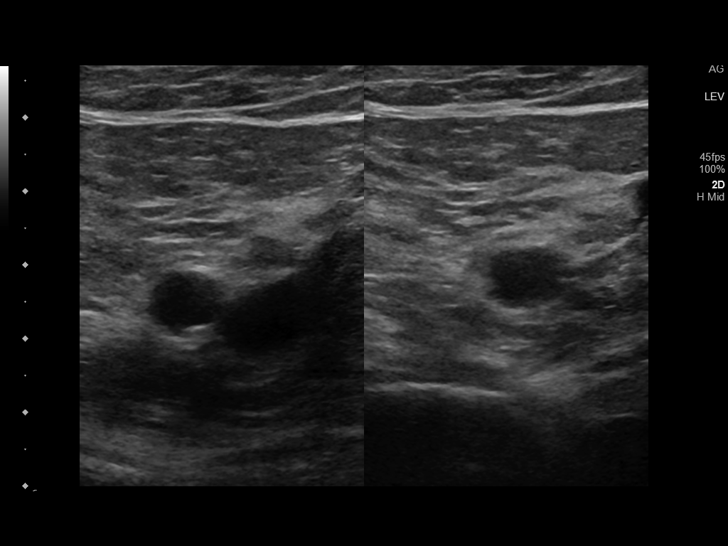
[im 4/38]
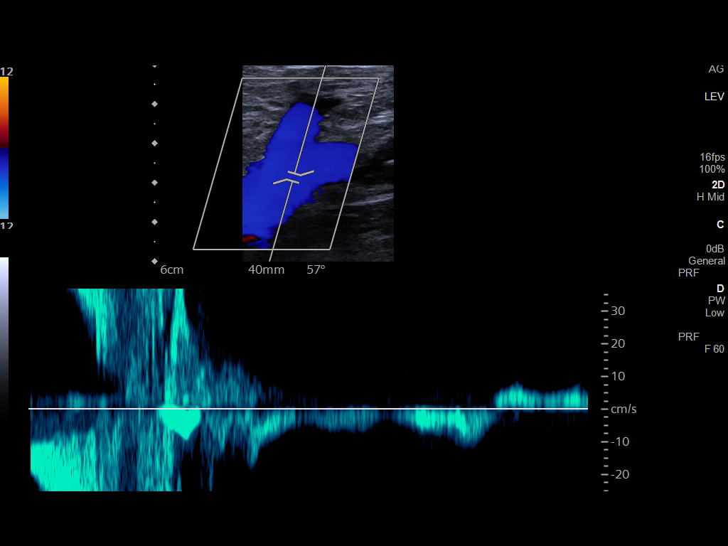
[im 7/38]
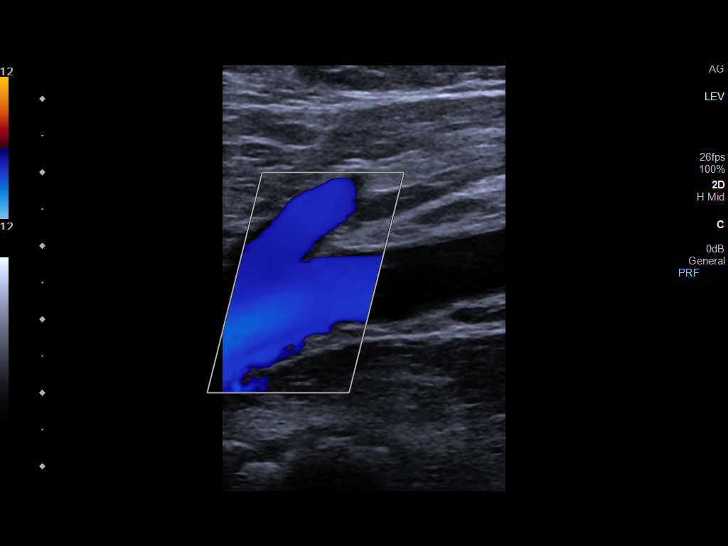
[im 10/38]
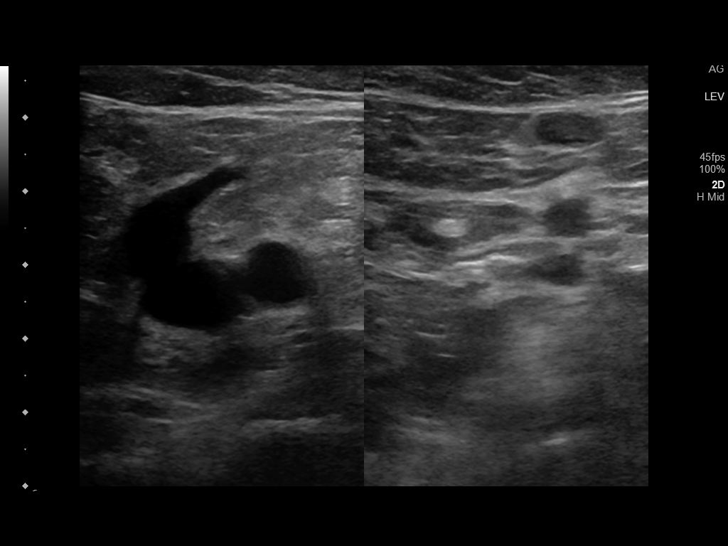
[im 13/38]
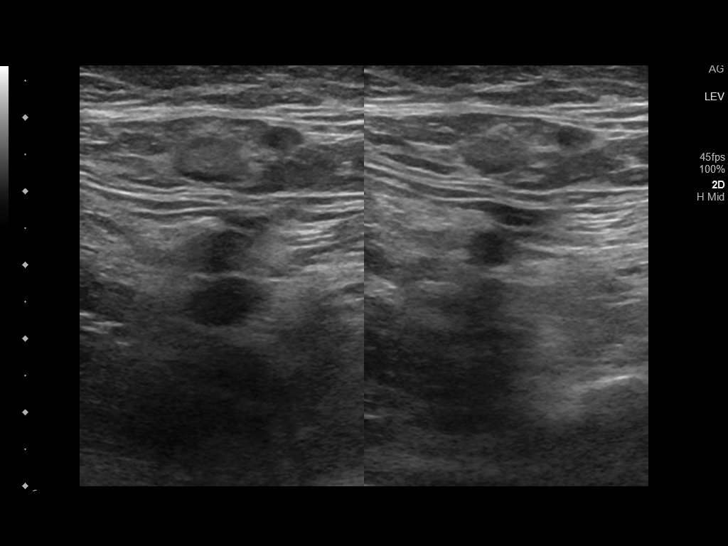
[im 17/38]
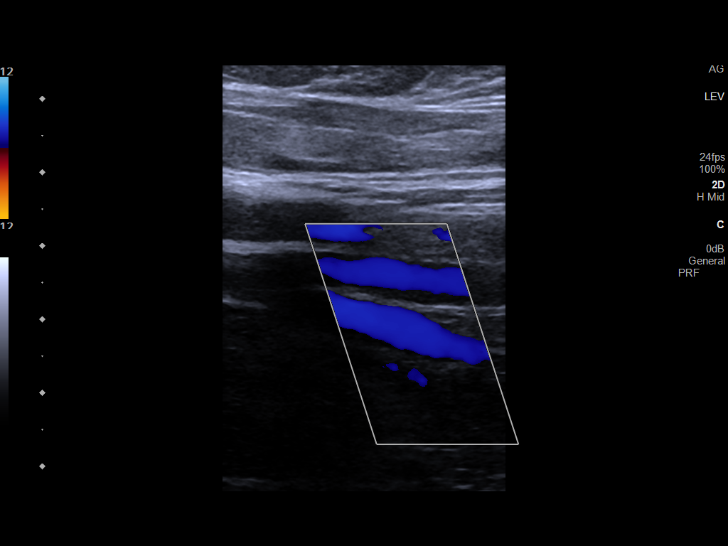
[im 20/38]
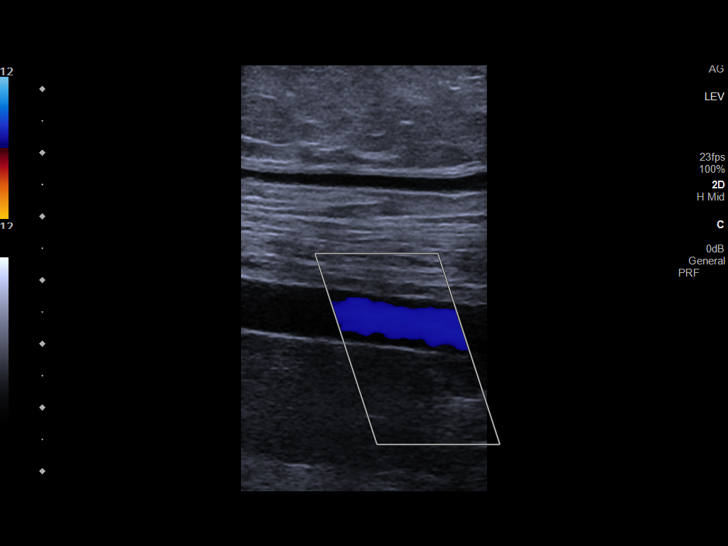
[im 21/38]
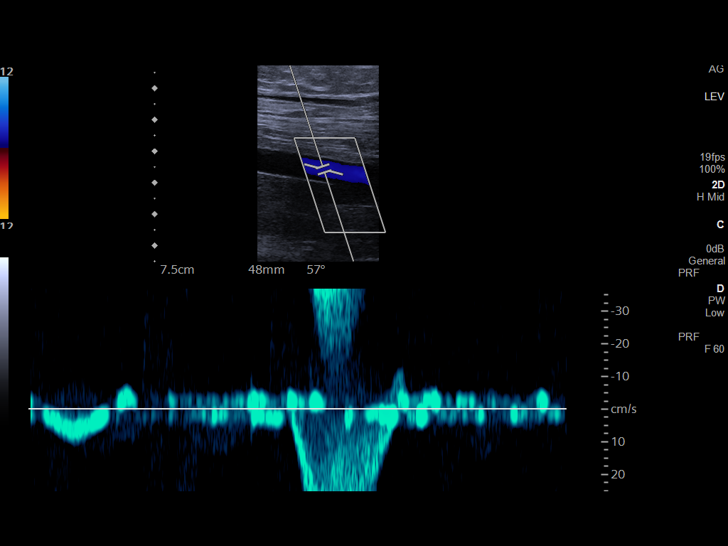
[im 25/38]
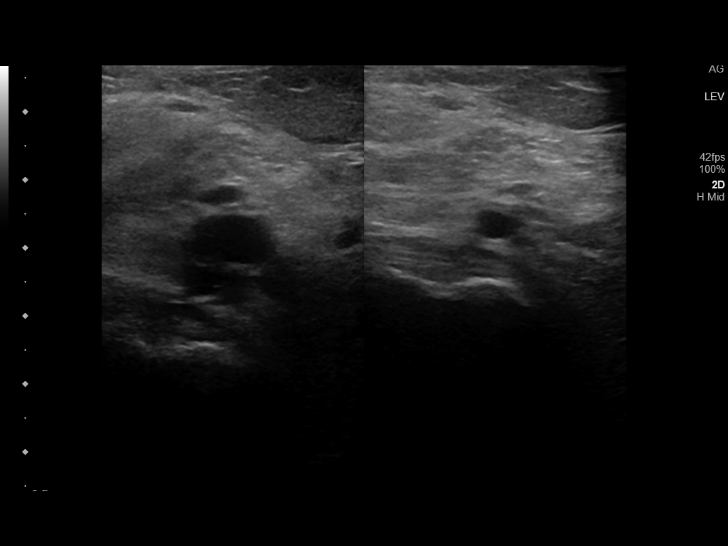
[im 28/38]
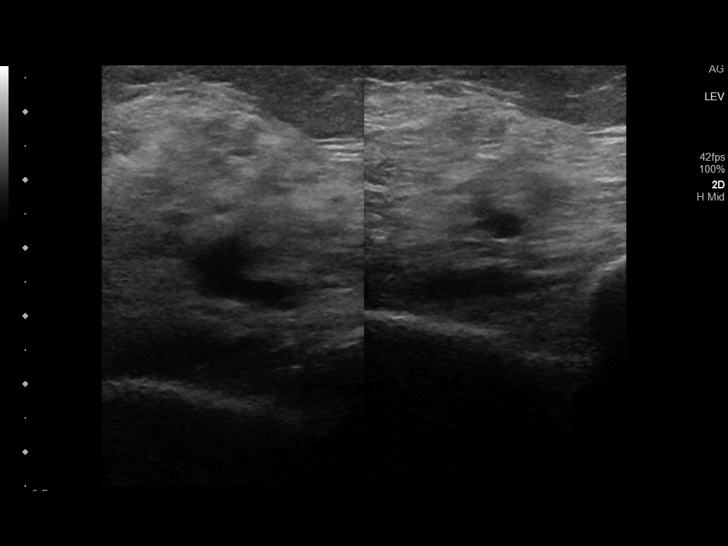
[im 31/38]
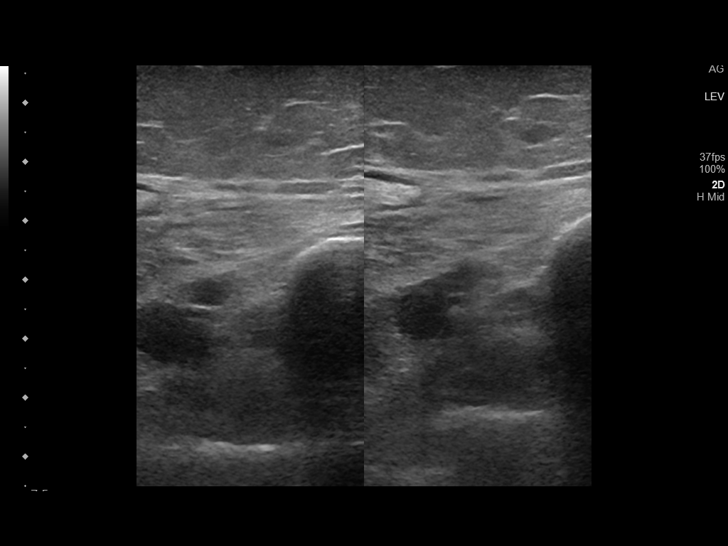
[im 34/38]
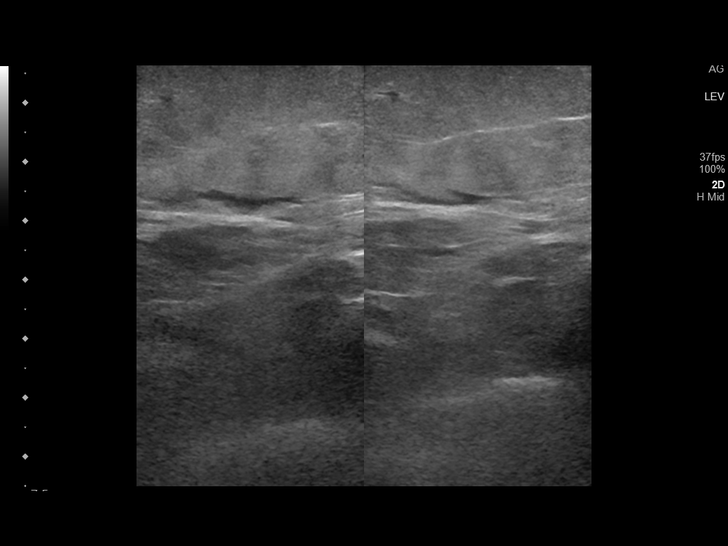
[im 38/38]
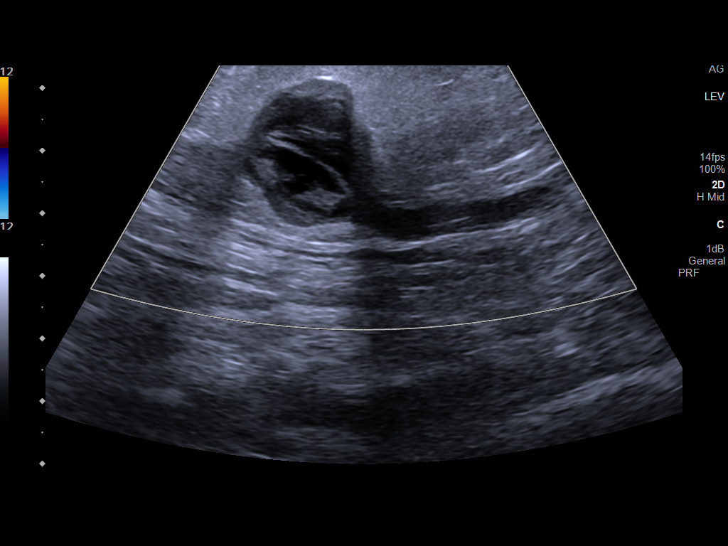

[13 of 24 positions shown; findings below may reference images not displayed]

FINDINGS: Contralateral Common Femoral Vein: Respiratory phasicity is normal
and symmetric with the symptomatic side. No evidence of thrombus.
Normal compressibility.

Common Femoral Vein: No evidence of thrombus. Normal
compressibility, respiratory phasicity and response to augmentation.

Saphenofemoral Junction: No evidence of thrombus. Normal
compressibility and flow on color Doppler imaging.

Profunda Femoral Vein: No evidence of thrombus. Normal
compressibility and flow on color Doppler imaging.

Femoral Vein: No evidence of thrombus. Normal compressibility,
respiratory phasicity and response to augmentation.

Popliteal Vein: No evidence of thrombus. Normal compressibility,
respiratory phasicity and response to augmentation.

Calf Veins: There is evidence of thrombus in the more posterior of
paired posterior tibial veins. Anteriorly oriented posterior tibial
vein is normally patent.

Superficial Great Saphenous Vein: Superficial thrombophlebitis is
noted involving much of the visualized great saphenous vein.
Thrombus does not extend to the saphenofemoral junction.

Venous Reflux:  None.

Other Findings:  No abnormal fluid collections.
IMPRESSION: 1. Deep vein thrombus in posterior segment of paired posterior
tibial veins.
2. Superficial thrombophlebitis of the left great saphenous vein.
